# Patient Record
Sex: Male | Born: 1955 | Race: White | Hispanic: No | Marital: Married | Smoking: Never smoker
Health system: Southern US, Community
[De-identification: ages and names within clinical notes are randomized; demographics above are authoritative.]

## PROBLEM LIST (undated history)

## (undated) DIAGNOSIS — I428 Other cardiomyopathies: Secondary | ICD-10-CM

## (undated) DIAGNOSIS — G8929 Other chronic pain: Secondary | ICD-10-CM

## (undated) DIAGNOSIS — M549 Dorsalgia, unspecified: Secondary | ICD-10-CM

## (undated) DIAGNOSIS — I639 Cerebral infarction, unspecified: Secondary | ICD-10-CM

## (undated) DIAGNOSIS — I509 Heart failure, unspecified: Secondary | ICD-10-CM

## (undated) HISTORY — PX: CHOLECYSTECTOMY: SHX55

## (undated) HISTORY — PX: THUMB FUSION: SUR636

## (undated) HISTORY — PX: BACK SURGERY: SHX140

## (undated) HISTORY — PX: APPENDECTOMY: SHX54

## (undated) HISTORY — PX: ELBOW SURGERY: SHX618

## (undated) HISTORY — PX: ANKLE SURGERY: SHX546

---

## 2000-06-30 HISTORY — PX: CARDIAC CATHETERIZATION: SHX172

## 2001-01-15 ENCOUNTER — Inpatient Hospital Stay (HOSPITAL_COMMUNITY): Admission: AD | Admit: 2001-01-15 | Discharge: 2001-01-18 | Payer: Self-pay | Admitting: Cardiology

## 2002-03-14 ENCOUNTER — Inpatient Hospital Stay (HOSPITAL_COMMUNITY): Admission: EM | Admit: 2002-03-14 | Discharge: 2002-03-18 | Payer: Self-pay | Admitting: Emergency Medicine

## 2002-03-14 ENCOUNTER — Encounter: Payer: Self-pay | Admitting: Cardiovascular Disease

## 2002-03-14 ENCOUNTER — Encounter: Payer: Self-pay | Admitting: Emergency Medicine

## 2002-03-15 ENCOUNTER — Encounter: Payer: Self-pay | Admitting: Cardiology

## 2002-03-17 ENCOUNTER — Encounter: Payer: Self-pay | Admitting: Cardiology

## 2002-09-19 ENCOUNTER — Ambulatory Visit (HOSPITAL_COMMUNITY): Admission: RE | Admit: 2002-09-19 | Discharge: 2002-09-19 | Payer: Self-pay | Admitting: General Surgery

## 2002-11-15 ENCOUNTER — Encounter: Payer: Self-pay | Admitting: Neurosurgery

## 2002-11-15 ENCOUNTER — Encounter: Admission: RE | Admit: 2002-11-15 | Discharge: 2002-11-15 | Payer: Self-pay | Admitting: Neurosurgery

## 2002-11-17 ENCOUNTER — Emergency Department (HOSPITAL_COMMUNITY): Admission: EM | Admit: 2002-11-17 | Discharge: 2002-11-17 | Payer: Self-pay | Admitting: Emergency Medicine

## 2002-11-17 ENCOUNTER — Encounter: Payer: Self-pay | Admitting: Emergency Medicine

## 2003-02-02 ENCOUNTER — Encounter: Payer: Self-pay | Admitting: Neurosurgery

## 2003-02-06 ENCOUNTER — Encounter: Payer: Self-pay | Admitting: Neurosurgery

## 2003-02-06 ENCOUNTER — Ambulatory Visit (HOSPITAL_COMMUNITY): Admission: RE | Admit: 2003-02-06 | Discharge: 2003-02-07 | Payer: Self-pay | Admitting: Neurosurgery

## 2003-05-05 ENCOUNTER — Ambulatory Visit (HOSPITAL_COMMUNITY): Admission: RE | Admit: 2003-05-05 | Discharge: 2003-05-05 | Payer: Self-pay | Admitting: Neurosurgery

## 2005-10-29 ENCOUNTER — Encounter: Payer: Self-pay | Admitting: Neurosurgery

## 2006-12-30 ENCOUNTER — Ambulatory Visit: Payer: Self-pay | Admitting: Internal Medicine

## 2006-12-30 ENCOUNTER — Inpatient Hospital Stay (HOSPITAL_COMMUNITY): Admission: EM | Admit: 2006-12-30 | Discharge: 2007-01-02 | Payer: Self-pay | Admitting: Emergency Medicine

## 2007-01-01 ENCOUNTER — Ambulatory Visit: Payer: Self-pay | Admitting: Vascular Surgery

## 2010-05-21 ENCOUNTER — Ambulatory Visit: Payer: Self-pay | Admitting: Cardiology

## 2010-05-22 ENCOUNTER — Ambulatory Visit: Payer: Self-pay | Admitting: Cardiology

## 2010-05-22 ENCOUNTER — Observation Stay (HOSPITAL_COMMUNITY): Admission: AD | Admit: 2010-05-22 | Discharge: 2010-05-22 | Payer: Self-pay | Admitting: Cardiology

## 2010-09-10 LAB — BASIC METABOLIC PANEL
BUN: 8 mg/dL (ref 6–23)
CO2: 29 mEq/L (ref 19–32)
Calcium: 8.5 mg/dL (ref 8.4–10.5)
Chloride: 103 mEq/L (ref 96–112)
Creatinine, Ser: 0.85 mg/dL (ref 0.4–1.5)
GFR calc Af Amer: >60 mL/min (ref >60)
GFR calc non Af Amer: >60 mL/min (ref >60)
Glucose, Bld: 118 mg/dL — ABNORMAL HIGH (ref 70–99)
Potassium: 4 mEq/L (ref 3.5–5.1)
Sodium: 138 mEq/L (ref 135–145)

## 2010-09-10 LAB — CBC
HCT: 37.3 % — ABNORMAL LOW (ref 39.0–52.0)
Hemoglobin: 12.7 g/dL — ABNORMAL LOW (ref 13.0–17.0)
MCH: 29.4 pg (ref 26.0–34.0)
MCHC: 34 g/dL (ref 30.0–36.0)
MCV: 86.3 fL (ref 78.0–100.0)
Platelets: 234 10*3/uL (ref 150–400)
RBC: 4.32 MIL/uL (ref 4.22–5.81)
RDW: 13.5 % (ref 11.5–15.5)
WBC: 8.5 10*3/uL (ref 4.0–10.5)

## 2010-09-10 LAB — PROTIME-INR
INR: 0.98 (ref 0.00–1.49)
Prothrombin Time: 13.2 seconds (ref 11.6–15.2)

## 2010-09-10 LAB — GLUCOSE, CAPILLARY: Glucose-Capillary: 129 mg/dL — ABNORMAL HIGH (ref 70–99)

## 2010-09-10 LAB — D-DIMER, QUANTITATIVE: D-Dimer, Quant: <0.22 ug/mL-FEU (ref 0.00–0.48)

## 2010-11-12 NOTE — H&P (Signed)
NAME:  Dalton Porter NO.:  [id number]   MEDICAL RECORD NO.:  [id number]          PATIENT TYPE:  INP   LOCATION:  3732                         FACILITY:  [facility]   PHYSICIAN:  Duke Salvia, MD, FACCDATE OF BIRTH:  [birth year]   DATE OF ADMISSION:  12/30/2006  DATE OF DISCHARGE:                              HISTORY & PHYSICAL   BRIEF HISTORY:  [name] is a 55 year old white male who was  transferred down [facility] to [facility] for further evaluation of  chest discomfort.  Mr. Gelpi states that approximately 2 p.m. today,  after he had finished washing a truck, he suddenly developed sharp pains  in his left chest going through to his back associated with nausea.  He  stated that his left arm felt funny, slightly numb.  He denied any  associated shortness of breath or diaphoresis.  He rested and his friend  had dropped him off with his wife at a laundromat and his wife drove him  to the emergency room for further evaluation.  At [facility] emergency  room, despite receiving multiple medications, his discomfort was reduced  from a 7 to a 2, but here at [facility], even at 8 p.m. at night, he  still having discomfort.  He feels the discomfort today was worse  intensity and longer duration than usual.  He describes this discomfort  that he has had at least for 5 years.  In the past couple months it has  been occurring one to two times per week and he feels this is more  frequent.  He says his usual duration is less than 10 minutes.  It can  occur at rest and with activity.  It does not occur nocturnally.  He  denies any accidents or injuries.  It also has a pleuritic component  which is chronic.   PAST MEDICAL HISTORY:  Allergies include:  1. CODEINE.  2. ACE intolerance secondary to a cough.   Medications prior to admission:  1. Neurontin 400 mg q.i.d.  2. Adalat 300 mg b.i.d.  3. Glipizide 5 mg daily.  4. Morphine ER 300 mg b.i.d.  5. Morphine IR 15 mg q.6h.  p.r.n.  6. Trazodone 100 mg two tablets q.h.s.  7. Afrin p.r.n.  8. Baby aspirin 81 daily.   Medical history is notable for:  1. Hypertension.  2. Type 2 diabetes with sugars ranging the 60s and 160s.  3. Depression.  4. Obesity.  5. He has a history of possible dilated cardiomyopathy.      Catheterization on January 18, 2001, showed an EF of 40%, no coronary      artery disease.  Echocardiogram on March 15, 2002, showed a low-      normal EF but was technically limited.  He was released from Dr.      Margarita Mail care in 2003 and recommended to follow up with his primary      care doctor for a sleep study to evaluate for obstructive sleep      apnea.  The patient states that his last stress test less than  1      year and he stated this was okay.  He is not sure if it was a      Myoview or a stress test only.  He thinks he may have had a      echocardiogram at that time as well as.   Surgical history is notable for:  1. T&A.  2. Appendectomy.  3. Hemorrhoid.  4. Nasal surgery.   SOCIAL HISTORY:  He resides in [place] with his wife.  He has one son, one  daughter, one granddaughter.  He is on disability secondary to his back.  He denies any history of tobacco, alcohol, drugs or herbal medication.  He states that he tries to maintain low salt/fat/cholesterol ADA diet.  He does not exercise regularly.   FAMILY HISTORY:  His mother is alive and well at the age of 67 with a  history of hyperlipidemia.  His father, age 51, alive and well.  He has  two sisters, one brother.  His brother is deceased at age 3 during a  house fire.   REVIEW OF SYSTEMS:  In addition to the above notable for chronic sinus  problems; contacts; dyspnea on exertion for the preceding 3 months;  positive snoring; nocturia; back, bilateral knees and hands arthralgias,  dysphagia with food and liquids.  He has not been referred to a GI  physician from his primary care physician.  He also describes some  abdominal  discomfort and constipation.   PHYSICAL EXAMINATION:  GENERAL:  Well-nourished, well-developed,  pleasant, obese white male in no apparent distress.  His wife is  present.  VITAL SIGNS:  Temperature is 98.4, blood pressure 115/62, pulse 81 and  regular, respirations 18 and regular, 96% saturation on 2 L.  HEENT:  Unremarkable.  NECK:  Supple without thyromegaly, adenopathy, JVD or carotid bruits.  CHEST:  Symmetrical excursion.  Clear to auscultation.  HEART:  PMI is not displaced.  Regular rate and rhythm with a normal S1  and S2.  Do not appreciate any murmurs, rubs, clicks or gallops.  All  pulses symmetrical and intact.  Do not appreciate any abdominal or  femoral bruits.  SKIN:  Integument is intact.  ABDOMEN:  Obese.  Bowel sounds present without organomegaly, masses or  tenderness.  EXTREMITIES:  Negative cyanosis, clubbing or edema.  MUSCULOSKELETAL:  Grossly unremarkable.  He does have some tenderness  with pushing with his upper extremities but no reproducible chest  discomfort on palpation or full range motion of upper extremities.  NEUROLOGIC:  Unremarkable.   Chest x-ray report from [facility] is pending.  EKG from [facility] and  repeated at [facility] shows normal sinus rhythm, normal axis, normal  intervals, nonspecific changes, early R waves, no changes compared to  old EKGs.  H&H is 13.3 and 37.0, platelets 309, wbc's 8.6.  Sodium 142,  potassium 4.0, BUN 11, creatinine 0.8, glucose 119.  CK-MB and troponin  from [facility] are within normal limits.  PTT 23, PT 12.0.   IMPRESSION:  1. Prolonged atypical chest discomfort with negative catheterization      in 2002.  EKG, enzymes at [facility] and EKG at [facility] have all been      within normal limits.  2. Probable obstructive sleep apnea, given obesity.  3. Dysphagia with food and water.  4. History as listed per past medical history.   DISPOSITION:  Dr. Graciela Husbands reviewed the patient's history, spoke with and  examined the patient.   We will admit him  to rule out myocardial  infarction.  However, we doubt his discomfort is cardiac ischemia  related.  Given his continued nature of chest discomfort, despite its  atypical nature, cardiac catheterization will be performed on December 31, 2006.  Cardiac catheterization as planned.  Anticipate discharge with  outpatient followup with his primary care physician, suggesting barium  swallow with a GI evaluation and obstructive sleep apnea evaluation.  We  will begin Protonix, also place him on ARB for kidney protection, and  continue his home medications.      Joellyn Rued, PA-C      Duke Salvia, MD, [facility]  Electronically Signed    EW/MEDQ  D:  12/30/2006  T:  12/31/2006  Job:  [id number]   cc:   Learta Codding, MD,FACC

## 2010-11-12 NOTE — Discharge Summary (Signed)
NAME:  Dalton Porter, Dalton Porter                ACCOUNT NO.:  [id number]   MEDICAL RECORD NO.:  [id number]          PATIENT TYPE:  INP   LOCATION:  3732                         FACILITY:  [facility]   PHYSICIAN:  Duke Salvia, MD, FACCDATE OF BIRTH:  1955/08/24   DATE OF ADMISSION:  12/30/2006  DATE OF DISCHARGE:  01/02/2007                               DISCHARGE SUMMARY   DISCHARGE DIAGNOSIS:  1. Chest pain ruled out for myocardial infarction,status post cardiac      catheterization this admission on December 31, 2006 showing normal      coronaries with mild right coronary artery spasm, pain reproducible      but not the same as the discomfort the patient states brought him      in.  Ejection fraction of 55% with mild global hypokinesis.  2. Diabetes.  Hemoglobin A1c 6.6 this admission.  3. Depression.  4. Chronic back pain requiring narcotic use.  5. Probable obstructive sleep apnea.  6. Anemia.  H&H 11.6 and 33.7 at time of discharge  7. Obesity.   PAST MEDICAL HISTORY AND INCLUDES SURGICAL HISTORY:  Appendectomy, nasal  surgery, chronic back pain requiring disability and ongoing narcotic  use.   PROCEDURES THIS ADMISSION:  Include cardiac catheterization on  12/31/2006 and right groin ultrasound 01/01/2007.  Ruled out pseudo  aneurysm or AV fistula, negative per ultrasound.   HOSPITAL COURSE:  A 55 year old Caucasian gentleman with no known  coronary artery disease history who presented to [facility]  initially for complaints of discomfort in his chest after he finished  washing his truck.  Patient with past medical history as stated above,  also with questionable history of dilated cardiomyopathy.  The patient  states he had a cardiac catheterization in 2002.  EF at that time of  40%, no coronary artery disease.  Apparently he had an echocardiogram  done in 2003 that showed a normal EF.  The patient with questionable  sleep apnea has been advised to have this evaluated in the past but  has  never followed up.  Initial EKG at [facility] showed normal sinus rhythm,  nonspecific changes.  No changes compared to old EKG.  H&H 13.3 and 37.  Cardiac markers at [facility] within normal limits.  The patient  transferred to [facility] for further evaluation.  The patient agreeable  to proceed with cardiac catheterization for further diagnostic  evaluation.  The patient to the cath lab on the date stated above,  results as stated above.  The patient tolerated procedure without  complications initially, later in the day complained of ongoing  discomfort in his right groin status post cath site.  The patient up  ambulating without any bleeding from cath site.  CBC stable postcath,  11.6 and 33.7 no change from previous H&H.  However, the patient had  ongoing pain in right groin.  No bruit.  No palpable hematoma.  Right  groin ultrasound obtained.  The patient kept overnight.  Right groin  ultrasound no evidence of pseudoaneurysm or AV fistula.  Dr. Graciela Husbands in to  see the patient on day of  discharge, afebrile, blood pressure stable,  patient stable to be discharged home.   FOLLOW UP:  The patient follow up with primary care physician.  That  would be Dr. Oswald Hillock in [place], [place], and the patient to follow up  with Dr. Andee Lineman at the [facility] office.  At time of discharge he has been  given the post cardiac catheterization discharge instructions regarding  activity limitations and wound care.  He is to follow up with Dr. Clelia Croft  for primary care needs.  No further of cardiology follow-up will  required at this time per Dr. Graciela Husbands.   MEDICATIONS AS PREVIOUSLY PRESCRIBED:  1. Gabapentin  2. Etodolac  3. Glipizide  4. Trazodone  5. Morphine  6. New prescription for Protonix 40 mg daily.   The patient is encouraged to follow up with his primary care physician  for outpatient sleep study and a GI workup.  He is to call our office in  [place] at [phone] for any problems from cath site.  There  is discrepancy.  It states the patient's primary care physician is Dr. Oswald Hillock in [place],  [place], previously Dr. Sherryll Burger in [place].   Duration of discharge encounter is greater than 30 minutes between  myself and physician.      Dorian Pod, ACNP      Duke Salvia, MD, [facility]  Electronically Signed    MB/MEDQ  D:  01/02/2007  T:  01/03/2007  Job:  [id number]   cc:   Dr. Oswald Hillock, [facility]  Kirstie Peri, MD

## 2010-11-12 NOTE — Cardiovascular Report (Signed)
NAME:  Dalton Porter, Dalton Porter                ACCOUNT NO.:  [id number]   MEDICAL RECORD NO.:  [id number]          PATIENT TYPE:  INP   LOCATION:  3732                         FACILITY[facility]  [facility]   PHYSICIAN:  Rollene Rotunda, MD, FACCDATE OF BIRTH:  February 28, 1956   DATE OF PROCEDURE:  12/31/2006  DATE OF DISCHARGE:                            CARDIAC CATHETERIZATION   PRIMARY:  Dr. Oswald Hillock at the [facility].   CARDIOLOGIST:  Dr. Lewayne Bunting.   PROCEDURE:  Left heart catheterization, coronary arteriography.   INDICATION:  Evaluate patient with chest pain suggestive of unstable  angina.   PROCEDURE:  Left heart catheterization was performed via the right  femoral artery.  The artery was cannulated using an anterior wall  puncture.  A #6-French arterial sheath was inserted via the modified  Seldinger technique.  Preformed Judkins and a pigtail catheter were  utilized.  The patient did have some discomfort with injection of his  right coronary artery.  There was some coronary artery spasm.  This is  much improved and almost gone by the time he left the cath lab.  There  were no changes on the monitored leads.   RESULTS:   HEMODYNAMICS:  LV 124/22, AO 128/94.   CORONARIES:  Left main was normal.  The LAD was normal.  First diagonal  was moderate-sized and normal.  The second diagonal was small and  normal.  Circumflex in the AV groove was normal.  There is a large first  obtuse marginal which had distal branches; it was normal throughout its  course.  Posterolateral was small and normal.  The right coronary artery  was a dominant, but somewhat small vessel.  There was some catheter-  induced spasm; he did have some discomfort with this; this responded to  sublingual nitroglycerin.  The vessel had no fixed obstructive disease.   LEFT VENTRICULOGRAM:  The left ventriculogram was obtained in the RAO  projection.  The EF was 55% with mild global hypokinesis.   CONCLUSION:  Normal coronary  arteries.  Mild right coronary artery  spasm; this did produce pain, but this was not the discomfort he  described on admission.   PLAN:  The patient should continued to have primary risk reduction.  He  will have an outpatient evaluation to consider other non-anginal  etiologies of his chest pain.      Rollene Rotunda, MD, [facility]  Electronically Signed     JH/MEDQ  D:  12/31/2006  T:  01/01/2007  Job:  [id number]

## 2010-11-15 NOTE — H&P (Signed)
NAME:  Dalton Porter, Dalton Porter                          ACCOUNT NO.:  [id number]   MEDICAL RECORD NO.:  [id number]                   PATIENT TYPE:  EMS   LOCATION:  MAJO                                 FACILITY:  [facility]   PHYSICIAN:  Noralyn Pick. Eden Emms, M.D. [facility]           DATE OF BIRTH:  [birth year]   DATE OF ADMISSION:  03/14/2002  DATE OF DISCHARGE:                                HISTORY & PHYSICAL   HISTORY OF PRESENT ILLNESS:  The patient is a 55 year old patient of Dr.  Andee Lineman and Dr. Clelia Croft in [place].  He has a history of nonischemic  cardiomyopathy.  His last catheterization was in September of last year and  showed an EF in the 40% range with no coronary disease. He awoke this  morning with some atypical left substernal chest pain.  It increased with  activity and had some pleuritic component. He had some nausea and vomiting  with this but no shortness of breath.  The pain seemed to have been relieved  with nitroglycerin and rest.   He had similar symptoms about six weeks ago.   At that time he just rested and his symptoms resolved.  He says he has had a  recent stress test in [place] in July with Dr. Clelia Croft and it was fine.   His catheterization January 18, 2001, showed normal coronaries and an EF of 40%  and the coronary risk factors include diabetes.   He is currently being treated for chronic bronchitis and sinusitis. He is on  multiple medications including Flonase, Singulair, Allegra. He just had some  recent sinus surgery and is currently not on antibiotics.  He has a deviated  septum and it seems that he has been fairly frustrated with the care of this  problem.   SOCIAL HISTORY:  He lives in [place] with his wife. He has a remote alcohol  history. 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   MEDICATIONS:  1. Allegra.  2. Flonase.  3. Singulair 10 a day.  4. Altace 2.5 a day.  5. Toprol-XL 50 a day.  6. Glucophage 1000 mg a day.  7. Amitriptyline q.h.s.  8. Darvocet  p.r.n.   PHYSICAL EXAMINATION:  HEENT:  His examination is remarkable for some nasal  congestion.  VITAL SIGNS:  Blood pressure is 130/60, pulse is 70 and regular.  LUNGS:  Mild end-expiratory wheezes at the bases.  Carotids normal.  HEART:  There is an S1, S2 without murmur, rub, or click.  ABDOMEN:  Benign.  EXTREMITY:  Lower extremities have intact pulse, no edema.   ECG is normal. Chest x-ray shows cardiomegaly with no active disease.   Labs are pending at this time.   IMPRESSION:  The patient's pain would appear to be noncardiac in etiology.  However, he is a diabetic the pain was relieved with nitroglycerin.   PLAN:  We will admit him to rule  out myocardial infarction.  We will do a  computed tomographic scan in the ER to further assess for other pathology  and pneumothorax. I suspect that his pain is more musculoskeletal in nature  and/or related to pleuritic cardial disease.   We will also check a sedimentation rate to rule out any active inflammation.   He has mild end-expiratory wheezes on examination and the CT scan will  hopefully rule out any significant PE.  He does not have any lower extremity  edema or evidence of new catheterization pain.   We will check his blood sugar to make sure that it is in order on continuous  Glucophage.   Further work-up of his sinusitis and deviated septum will be per his primary  care M.D.   At least for the time being, given his recent Cardiolite study and normal  catheterization a year ago, I do not think functional cardiac testing is  indicated unless his pain becomes more typical or his enzymes are positive.                                               Noralyn Pick. Eden Emms, M.D. [facility]    PCN/MEDQ  D:  03/14/2002  T:  03/14/2002  Job:  [id number]

## 2010-11-15 NOTE — Op Note (Signed)
NAME:  Dalton Porter, Dalton Porter                          ACCOUNT NO.:  [id number]   MEDICAL RECORD NO.:  [id number]                   PATIENT TYPE:  OIB   LOCATION:  3172                                 FACILITY:  [facility]   PHYSICIAN:  Reinaldo Meeker, M.D.              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   PROCEDURE:  T7-8, T8-9 decompressive laminotomy, followed by diskectomy at  T7-8.   SURGEON:  Reinaldo Meeker, M.D.   ASSISTANT:  Dr. Felix Pacini.   PROCEDURE IN DETAIL:  After being placed in the prone position, the  patient's back was prepped and draped in the usual sterile fashion.  A  series of localizing x-rays were taken to identify the appropriate level.  A  midline incision was made above the spinous processes of T7, T8, and T9.  Using Bovie cutting current, the incision was carried down to the spinous  processes.  A subperiosteal dissection was then carried out along the  spinous processes, lamina, and facet joint.  A self-retaining retractor was  placed for exposure.  X-rays showed we approached the appropriate level.  Starting at T7-8, a high-speed drill was used to perform a laminotomy above  the level of the inferior one-half of the T7 lamina and the superior one-  quarter of the T8 lamina.  The residual bone ligament were removed in a  piecemeal fashion.  Transpedicular decompression was then carried out, going  wide laterally at the level of the disk space and then drilling the superior  aspect of the pedicle to give good access to the lateral aspect of the disk  space.  A similar decompression was then carried out at T8-9 and once again  over the inferior one-half of the T8 lamina, the superior one-quarter of the  T9 lamina, and drilled in far lateral  to allow access to the disk space.  Nerve root decompressions were carried out at both levels.  In the process  of doing this, a small tear in the axilla of the T8 nerve root was  encountered.  This was patched with Tisseel at the end of the case.  At this  point, the microscope was draped and brought into the field.  Starting at T7-  8, microdissection technique was used to identify the lateral aspect of the  spine and nerve root.  The disk space was then incised with micropituitary  and curettes.  Disk space clean-out was carried out to decompress the nerve  root.  Inspection of T8-9 showed that the disk area was calcified with no  definite evidence of removable disk, and it was elected not to incise the  disk space at that level.  At  this point, large amounts of irrigation were  carried out.  Any bleeding was controlled with bipolar coagulation and  Gelfoam.  The wound was then closed using interrupted Vicryl in the muscle  and fascia and subcutaneous and subcuticular tissues and running nylon on  the skin.  A sterile dressing was then applied, and the patient was  extubated and was taken to the recovery room in stable condition.                                               Reinaldo Meeker, M.D.    ROK/MEDQ  D:  02/06/2003  T:  02/06/2003  Job:  [id number]

## 2010-11-15 NOTE — Discharge Summary (Signed)
NAME:  Dalton Porter, Dalton Porter                          ACCOUNT NO.:  [id number]   MEDICAL RECORD NO.:  [id number]                   PATIENT TYPE:  INP   LOCATION:  3016                                 FACILITY:  [facility]   PHYSICIAN:  Noralyn Pick. Eden Emms, M.D. [facility]           DATE OF BIRTH:  [birth year]   DATE OF ADMISSION:  03/14/2002  DATE OF DISCHARGE:                           DISCHARGE SUMMARY - REFERRING   PROCEDURE:  Esophagogastroduodenoscopy on 09/17, pulmonary function tests  09/18, abdominal ultrasound 09/18, chest CT scan 09/15.   REASON FOR ADMISSION:  Please refer to dictation admission note.   LABORATORY DATA:  Normal CBC, INR 1.0, D-dimer less than 0.22.  Electrolytes  and renal function within normal limits.  Normal liver enzymes.  Normal  amylase/lipase.  Normal serial cardiac markers (x three).   ADMISSION CXR:  Cardiomegaly:  No active disease.   HOSPITAL COURSE:  Mr. Lupinski is a 55 year old male, with history of  nonischemic cardiomyopathy followed by Dr. Andee Lineman in [place], who presented for  evaluation of chest pain.   The patient underwent subsequent extensive diagnostic testing.  He ruled out  for MI with normal serial cardiac enzymes.  Workup for pulmonary embolus  consisted of a D-dimer, which was negative, as well as CT scan which was  entirely within normal limits.   The patient was referred to Dr. Jayme Cloud for evaluation of cough.  She felt  this was due to bronchospasm which could represent reactive airway disease  versus asthma.  Recommendation was to consider discontinuation of ACE  inhibitor, and this was done at the time of discharge.  The patient was also  placed on a trial of Advair and was referred for pulmonary function testing  - these were performed morning of scheduled discharge and were within normal  limits as well.   The patient also underwent GI evaluation for evaluation of her nausea and  epigastric discomfort.  Esophagogastroduodenoscopy was entirely  within  normal limits.  Recommendation was to initiate treatment with PPI for eight  weeks.  The patient was also referred for abdominal ultrasound.  This also  was within normal limits.   Following completion of these tests, no further workup was recommended, and  the patient was cleared for discharge on hospital day #3.   MEDICATION ADJUSTMENTS THIS ADMISSION:  Addition of Protonix and Advair,  discontinuation of Altace.   DISCHARGE MEDICATIONS:  Protonix 40 mg q.d., Advair 100/50 one inhalation  twice daily, Allegra 60 mg b.i.d., Flonase as previously directed, Singulair  10 mg q.d., Toprol XL 50 mg q.d., Glucophage XR 1000 mg q.d., amitriptyline  20 mg q.h.s., Xanax 0.5 mg p.r.n.   DISCHARGE INSTRUCTIONS:  1. Stop taking Altace.  2. Followup with Dr. Andee Lineman on Friday, October 17, at 10 a.m. at the [facility] in [place].  3. Arrange followup with Dr. Kirstie Peri in approximately two weeks.  DISCHARGE DIAGNOSES:  1. Nonischemic chest pain.     a. Normal serial cardiac enzymes.  2. Nonischemic cardiomyopathy.     a. Normal coronary arteries; if 40% with global hypokinesis -        catheterization in July 2002.  3. Bronchospasm.     a. Normal pulmonary function tests.     b. ACE inhibitor discontinued.  4. Type 2 diabetes mellitus.     Gene Serpe, P.A. [facility]                      Peter C. Eden Emms, M.D. [facility]    GS/MEDQ  D:  03/18/2002  T:  03/21/2002  Job:  [id number]   cc:   Kirstie Peri  [address]  [place]  [place] [address]  Fax: [phone]   [facility]  [address]  [place], [place] [address]   Wilhemina Bonito. Eda Keys., M.D. [facility]   Danice Goltz, M.D. [facility]  [address] [place], [place] [address]  Fax: [phone]

## 2010-11-15 NOTE — Consult Note (Signed)
NAME:  Dalton Porter, Dalton Porter                          ACCOUNT NO.:  [id number]   MEDICAL RECORD NO.:  [id number]                   PATIENT TYPE:  INP   LOCATION:  3016                                 FACILITY:  [facility]   PHYSICIAN:  Danice Goltz, M.D. [facility]            DATE OF BIRTH:  August 14, 1955   DATE OF CONSULTATION:  03/17/2002  DATE OF DISCHARGE:                                   CONSULTATION   REASON FOR CONSULTATION:  Cough, rule out pneumonia.   BRIEF HISTORY:  This is a 55 year old white male with a longstanding history  of diabetes mellitus and nonischemic cardiomyopathy who presented on  03/14/2002 with substernal chest pain.  He was admitted for cardiology  service,and he has been ruled out for MI.  Cardiac workup so far has been  noncontributory.  He has persisted in having chest discomfort as well as  nausea and dyspeptic symptoms.  He has been evaluated by our GI department,  and they found no cause for his symptoms via EGD.  Today an ultrasound of  the gallbladder was completed which was entirely normal.   The patient states he has had a cough since April.  He has had chronic upper  respiratory symptoms and chronic sinusitis, being treated for 30 days with  rotating antibiotics to include Cipro, and antibiotic unknown to him, Ancef.  He also had two trials of prednisone which did not help his symptoms at all.  He has had followup CTs.  He apparently was being followed by an ENT  physician in [place].  His primary care physician is Dr .Sherryll Burger.   The patient denies any chronic dyspnea.  He states that occasionally he  wheezes, but this is not a major problem.  He denies any productive cough.  He has had no hemoptysis.  He voices no other complaints.  He denies any  fevers, chills, or sweats.   CURRENT MEDICATIONS:  As noted on the MAR.  These were studied.  Most of his  medications are for chronic nasal congestion and management of his  cardiomyopathy.   SOCIAL HISTORY:  He works  as a Freight forwarder.  He lives in [place].  He is  married and lives with his wife.  He is not exposed to industrial dust.  He  denies any cigarette use ever.  He is not exposed to second-hand smoke.   FAMILY HISTORY:  Noncontributory to the current complaint.   REVIEW OF SYSTEMS:  Remarkable for as noted above.  He denies any other  symptomatology.   ALLERGIES:  CODEINE causes nausea and vomiting.   PHYSICAL EXAMINATION:  VITAL SIGNS:  Blood pressure 99/56, pulse 70.  The  patient is afebrile at 98.4.  He has been afebrile during his entire  hospitalization.  Oxygen saturation is 93% on room air.  GENERAL:  Moderately obese white male who is in no acute distress.  He does  have  a nasal voice.  HEENT:  Remarkable for nasal congestion.  NECK:  Supple.  No adenopathy noted, no JVD.  LUNGS:  The patient has no rhonchi noted.  He has some end-expiratory  bibasilar wheezes.  These do not clear with cough or deep inspiration.  CARDIAC:  Distant, regular rate and rhythm.  No rubs, murmurs, or gallops  heard.  ABDOMEN:  Obese.  He had some diffuse epigastric tenderness which is  nonspecific.  No hepatosplenomegaly noted.  EXTREMITIES:  No cyanosis, clubbing, or edema noted.  NEUROLOGIC:  Grossly nonfocal.   LABORATORY DATA:  I have reviewed all the patient's laboratory data.  The  most salient data is the fact that he has a clear chest x-ray.   CT scan of the chest failed to show any infiltrates and no PE.   In addition, the patient has no gallstones noted on ultrasound of the  gallbladder done today.   Cardiac workup so far has been negative.   IMPRESSION:  1. Chronic cough which is likely multifactorial.  I believe the patient has     cough secondary to postnasal drip syndrome due to chronic sinusitis.  In     addition, this could be compounded by chronic gastroesophageal reflux     with laryngopharyngeal reflux.  Additional causes could be ACE inhibitor     side effect.  Further  asthma or bronchospasm could also trigger cough in     the form of cough flare asthma.  2. Bronchospasm.  Rule out reactive airway disease versus true asthma.     Reactive airway could be occurring due to the patient's sinus disease and     should resolve once the sinus disease is cleared.  3. Chronic sinusitis.  Being managed by ENT already.  4. History of nonischemic cardiomyopathy managed per cardiology.   PLAN:  1. Obtain pulmonary function testing.  2. Will give the patient a trial of Advair 100/50 one inhalation twice a     day.  3. Would consider discontinuing ACE inhibitor if cough continues.  4. Would try to simplify his extensive medication regimen which appears to     be excessive.  5. All of these items can be followed as an outpatient should the patient be     discharged.  He would like to continue followup with Dr. Sherryll Burger in [place] and     also with pulmonary in [place] whom he has seen before but cannot recall the     name.   Thank you very much for the consult.  I will continue to follow along with  you while the patient is in house.                                               Danice Goltz, M.D. [facility]    LG/MEDQ  D:  03/17/2002  T:  03/19/2002  Job:  [id number]

## 2010-11-15 NOTE — H&P (Signed)
NAME:  Dalton Porter, Dalton Porter NO.:  [id number]   MEDICAL RECORD NO.:  [id number]                  PATIENT TYPE:   LOCATION:                                       FACILITY:   PHYSICIAN:  Dalia Heading, M.D.               DATE OF BIRTH:  05-Sep-1955   DATE OF ADMISSION:  DATE OF DISCHARGE:                                HISTORY & PHYSICAL   CHIEF COMPLAINT:  Chronic cholecystitis.   HISTORY OF PRESENT ILLNESS:  The patient is a 55 year old white male who is  referred for evaluation and treatment of biliary colic secondary to chronic  cholecystitis.  He has been having intermittent episodes of right upper  quadrant abdominal pain with radiation to the right flank, nausea,  indigestion, and bloating for the past several weeks. It seems to be getting  worse.  No fever, chills, or jaundice have been noted.  CT scan of the  abdomen was negative for any other problem.   PAST MEDICAL HISTORY:  Past medical history includes hypertension, non-  insulin-dependent diabetes mellitus, depression.   PAST SURGICAL HISTORY:  Tonsillectomy, appendectomy, extremity surgery,  hemorrhoid surgery.   CURRENT MEDICATIONS:  1. Glucophage XR 1 gm p.o. daily.  2. Toprol XL 50 mg p.o. daily.  3. Xanax 0.5 mg p.o. b.i.d.  4. Altace 2.5 mg p.o. daily.  5. Amitriptyline 10 mg 2 tablets q.h.s.   ALLERGIES:  Codeine.   REVIEW OF SYSTEMS:  Noncontributory.   PHYSICAL EXAMINATION:  GENERAL:  On physical examination, the patient is a  well-developed well-nourished white male in no acute distress.  VITAL SIGNS:  He is afebrile and vital signs are stable.  HEENT:  Examination reveals no scleral icterus.  LUNGS:  Clear to auscultation with equal breath sounds bilaterally.  HEART:  Heart examination reveals a regular rate and rhythm without S3, S4,  or murmurs.  ABDOMEN:  The abdomen is soft with slight tenderness noted in the right  upper quadrant to palpation.  No  hepatosplenomegaly, masses, or hernias are  identified.   X-RAY DATA:  The hepatobiliary scan reveals a normal gallbladder ejection  fraction after the second injection of CCK, but his symptoms were  reproducible with CCK injection.   IMPRESSION:  Biliary colic, chronic cholecystitis.   PLAN:  The patient is scheduled for laparoscopic cholecystectomy on  09/19/2002.  The risks and benefits of the procedure including bleeding  infection, hepatobiliary injury, and the possibility of an open procedure  were fully explained to the patient who gave informed consent.                                               Dalia Heading, M.D.    MAJ/MEDQ  D:  09/15/2002  T:  09/16/2002  Job:  [id number]   cc:   Kirstie Peri  [address]  [place] [address]  Fax: [phone]

## 2010-11-15 NOTE — Cardiovascular Report (Signed)
[place]  Patient:    Dalton Porter, Dalton Porter                       MRN: [id number] Proc. Date: 01/18/01 Adm. Date:  [id number] Attending:  Learta Codding CC:         Tawnya Crook, M.D., [facility]  Lewayne Bunting, M.D.  Cardiopulmonary Laboratory   Cardiac Catheterization  PROCEDURES PERFORMED:  Cardiac catheterization.  CLINICAL HISTORY:  The patient is 55 years old and has a remote history of a normal catheterization.  Over the last 10 days, he has developed sharp, left-sided chest pain, which is mostly nonexertional.  He was evaluated with a Cardiolite scan which showed an ejection fraction of 40% and questionable inferoapical scarring.  For this reason, he was scheduled for evaluation angiography.  DESCRIPTION OF PROCEDURE:  The procedure was performed via the right femoral artery using an arterial sheath and 6 French preformed coronary catheters.  A front wall arterial puncture was performed and Omnipaque contrast was used. The patient tolerated the procedure well and left the laboratory in satisfactory condition.  RESULTS:  The left main coronary artery:  The left main coronary artery was free of significant disease.  Left anterior descending:  The left anterior descending artery gave rise to three diagonal branches and three septal perforators.  These and the LAD were free of significant disease, although the flow down the LAD was slightly slow (TIMI-2).  Circumflex artery:  The circumflex artery gave rise to a large marginal branch and AV branch which terminated in a posterolateral branch.  These vessels were free of significant disease.  Right coronary artery:  The right coronary is a small vessel that gave rise to a conus branch, a small large right ventricular branch and a very small posterior descending branch.  Initially, there was some spasm in the proximal vessel but this resolved with nitroglycerin and there was no  significant disease.  LEFT VENTRICULOGRAPHY:  The left ventriculogram performed in the RAO projection showed global hypokinesis with an estimated ejection fraction of the 40%.  HEMODYNAMIC DATA:  The aortic pressure was 118/73 with a mean of 90.  Left ventricular pressure was 118/27.  CONCLUSIONS: 1. Normal coronary angiography. 2. Mild to moderate global hypokinesis consistent with a nonischemic    cardiomyopathy.  RECOMMENDATIONS:  The patient appears to have a early nonischemic cardiomyopathy.  The etiology is not clear.  He does not have much alcohol consumption and has no history of hypertension by his account.  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  We will plan treatment with ACE inhibitors and beta blockers and followup in the [facility]. DD:  01/18/01 TD:  01/18/01 Job: [id number]

## 2010-11-15 NOTE — Op Note (Signed)
NAME:  ISON, WICHMANN                          ACCOUNT NO.:  [id number]   MEDICAL RECORD NO.:  [id number]                   PATIENT TYPE:  AMB   LOCATION:  DAY                                  FACILITY:  [facility]   PHYSICIAN:  Dalia Heading, M.D.               DATE OF BIRTH:  13-Jan-1956   DATE OF PROCEDURE:  09/19/2002  DATE OF DISCHARGE:                                 OPERATIVE REPORT   PREOPERATIVE DIAGNOSIS:  Chronic cholecystitis.   POSTOPERATIVE DIAGNOSIS:  Chronic cholecystitis.   PROCEDURE:  Laparoscopic cholecystectomy   SURGEON:  Dalia Heading, M.D.   ASSISTANT:  Bernerd Limbo. Leona Carry, M.D.   ANESTHESIA:  General endotracheal   INDICATIONS:  The patient is a 55 year old white male who presents with  biliary colic secondary to chronic cholecystitis.  The risks and benefits of  the procedure including bleeding, infection, hepatobiliary injury, and the  possibility of an open procedure were fully explained to the patient, who  gave informed consent.   DESCRIPTION OF PROCEDURE:  The patient was placed in the supine position.  After induction of general endotracheal anesthesia, the abdomen was prepped  and draped using the usual sterile technique with Betadine.  Surgical site  confirmation was performed.   A supraumbilical incision was made down to the fascia.  A Veress needle was  introduced into the abdominal cavity and confirmation of placement was done  using the saline drop test.  The abdomen was then insufflated to 16 mmHg  pressure.  An 11-mm trocar was introduced into the abdominal cavity under  direct visualization without difficulty.  The patient was placed in reverse  Trendelenburg position and an additional 11-mm trocar was placed in the  epigastric region and 5-mm trocars were placed in the right upper quadrant  and right flank regions. The liver was inspected and noted to be within  normal limits.  The gallbladder was retracted superiorly and laterally.   The  dissection was begun around the infundibulum of the gallbladder.  The cystic  duct was first identified.  Its junction to the infundibulum fully  identified.  Endoclips were placed proximally and distally on the cystic  duct; and the cystic duct was divided.  This was likewise done on the cystic  artery.  The gallbladder was then freed away from the gallbladder fossa  using Bovie electrocautery.  The gallbladder was delivered through the  epigastric trocar site without difficulty.  The gallbladder fossa was  inspected and no abnormal bleeding or bile leakage was noted.  All fluid and  air were then evacuated from the abdominal cavity prior to removal of the  trocars.   All wounds were irrigated with normal saline.  All wounds were injected with  0.5% Sensorcaine.  The supraumbilical fascia was reapproximated using an #0  Vicryl interrupted suture. All skin incisions were closed using staples.  Betadine ointment and dry sterile dressings  were applied.   All tape and needle counts correct at the end of the procedure.  The patient  was extubated in the operating room and went back to recovery room in awake  and stable condition.   COMPLICATIONS:  None.   SPECIMENS:  Gallbladder.   BLOOD LOSS:  Minimal.                                               Dalia Heading, M.D.    MAJ/MEDQ  D:  09/19/2002  T:  09/19/2002  Job:  [id number]   cc:   Kirstie Peri  [address]  [place] [address]  Fax: [phone]

## 2011-01-15 ENCOUNTER — Telehealth: Payer: Self-pay | Admitting: Cardiology

## 2011-01-15 ENCOUNTER — Other Ambulatory Visit: Payer: Self-pay

## 2011-01-15 ENCOUNTER — Encounter: Payer: Self-pay | Admitting: Emergency Medicine

## 2011-01-15 ENCOUNTER — Observation Stay (HOSPITAL_COMMUNITY)
Admission: EM | Admit: 2011-01-15 | Discharge: 2011-01-16 | Disposition: A | Discharge status: Home / Self Care | Payer: Medicare Other | Attending: Internal Medicine | Admitting: Internal Medicine

## 2011-01-15 ENCOUNTER — Emergency Department (HOSPITAL_COMMUNITY): Payer: Medicare Other

## 2011-01-15 DIAGNOSIS — I10 Essential (primary) hypertension: Secondary | ICD-10-CM | POA: Diagnosis present

## 2011-01-15 DIAGNOSIS — R079 Chest pain, unspecified: Secondary | ICD-10-CM | POA: Diagnosis present

## 2011-01-15 DIAGNOSIS — R0602 Shortness of breath: Secondary | ICD-10-CM | POA: Insufficient documentation

## 2011-01-15 DIAGNOSIS — E1142 Type 2 diabetes mellitus with diabetic polyneuropathy: Secondary | ICD-10-CM | POA: Insufficient documentation

## 2011-01-15 DIAGNOSIS — Z79899 Other long term (current) drug therapy: Secondary | ICD-10-CM | POA: Insufficient documentation

## 2011-01-15 DIAGNOSIS — R0789 Other chest pain: Principal | ICD-10-CM | POA: Insufficient documentation

## 2011-01-15 DIAGNOSIS — E1149 Type 2 diabetes mellitus with other diabetic neurological complication: Secondary | ICD-10-CM | POA: Diagnosis present

## 2011-01-15 HISTORY — DX: Other chronic pain: G89.29

## 2011-01-15 HISTORY — DX: Other chronic pain: M54.9

## 2011-01-15 HISTORY — DX: Heart failure, unspecified: I50.9

## 2011-01-15 LAB — COMPREHENSIVE METABOLIC PANEL
ALT: 48 U/L (ref 0–53)
AST: 37 U/L (ref 0–37)
Albumin: 3.8 g/dL (ref 3.5–5.2)
Alkaline Phosphatase: 51 U/L (ref 39–117)
BUN: 12 mg/dL (ref 6–23)
CO2: 28 mEq/L (ref 19–32)
Calcium: 8.8 mg/dL (ref 8.4–10.5)
Chloride: 104 mEq/L (ref 96–112)
Creatinine, Ser: 0.74 mg/dL (ref 0.50–1.35)
GFR calc Af Amer: >60 mL/min (ref >60)
GFR calc non Af Amer: >60 mL/min (ref >60)
Glucose, Bld: 122 mg/dL — ABNORMAL HIGH (ref 70–99)
Potassium: 4.4 mEq/L (ref 3.5–5.1)
Sodium: 141 mEq/L (ref 135–145)
Total Bilirubin: 0.4 mg/dL (ref 0.3–1.2)
Total Protein: 6.9 g/dL (ref 6.0–8.3)

## 2011-01-15 LAB — CBC
HCT: 37.5 % — ABNORMAL LOW (ref 39.0–52.0)
Hemoglobin: 12.8 g/dL — ABNORMAL LOW (ref 13.0–17.0)
MCH: 29.6 pg (ref 26.0–34.0)
MCHC: 34.1 g/dL (ref 30.0–36.0)
MCV: 86.6 fL (ref 78.0–100.0)
Platelets: 257 10*3/uL (ref 150–400)
RBC: 4.33 MIL/uL (ref 4.22–5.81)
RDW: 13.7 % (ref 11.5–15.5)
WBC: 8.2 10*3/uL (ref 4.0–10.5)

## 2011-01-15 LAB — CARDIAC PANEL(CRET KIN+CKTOT+MB+TROPI)
CK, MB: 2.4 ng/mL (ref 0.3–4.0)
CK, MB: 1.8 ng/mL (ref 0.3–4.0)
Relative Index: INVALID (ref 0.0–2.5)
Total CK: 89 U/L (ref 7–232)
Total CK: 64 U/L (ref 7–232)
Troponin I: <0.3 ng/mL (ref <0.30)
Troponin I: <0.3 ng/mL (ref <0.30)

## 2011-01-15 LAB — GLUCOSE, CAPILLARY: Glucose-Capillary: 130 mg/dL — ABNORMAL HIGH (ref 70–99)

## 2011-01-15 MED ORDER — INSULIN ASPART 100 UNIT/ML ~~LOC~~ SOLN: 0.0000 [IU] | Freq: Every day | SUBCUTANEOUS | Status: DC

## 2011-01-15 MED ORDER — ENOXAPARIN SODIUM 40 MG/0.4ML ~~LOC~~ SOLN
40.0000 mg | Freq: Every day | SUBCUTANEOUS | Status: DC
  Administered 2011-01-15 – 2011-01-16 (×2): 40 mg via SUBCUTANEOUS
  Filled 2011-01-15 (×2): qty 0.4

## 2011-01-15 MED ORDER — SODIUM CHLORIDE 0.9 % IJ SOLN
INTRAMUSCULAR | Status: AC
  Filled 2011-01-15: qty 10

## 2011-01-15 MED ORDER — GLIPIZIDE 5 MG PO TABS
5.0000 mg | ORAL_TABLET | Freq: Two times a day (BID) | ORAL | Status: DC
  Administered 2011-01-16: 5 mg via ORAL
  Filled 2011-01-15: qty 1

## 2011-01-15 MED ORDER — NITROGLYCERIN 2 % TD OINT
1.0000 [in_us] | TOPICAL_OINTMENT | Freq: Three times a day (TID) | TRANSDERMAL | Status: DC
  Administered 2011-01-16 (×2): 1 [in_us] via TOPICAL
  Filled 2011-01-15 (×2): qty 1

## 2011-01-15 MED ORDER — INSULIN ASPART 100 UNIT/ML ~~LOC~~ SOLN: 0.0000 [IU] | Freq: Three times a day (TID) | SUBCUTANEOUS | Status: DC

## 2011-01-15 MED ORDER — SODIUM CHLORIDE 0.9 % IV SOLN
20.0000 mL | INTRAVENOUS | Status: DC
  Administered 2011-01-15: 20 mL via INTRAVENOUS

## 2011-01-15 MED ORDER — MORPHINE SULFATE CR 30 MG PO TB12
30.0000 mg | ORAL_TABLET | Freq: Two times a day (BID) | ORAL | Status: DC
  Administered 2011-01-15 – 2011-01-16 (×2): 30 mg via ORAL
  Filled 2011-01-15 (×2): qty 1

## 2011-01-15 MED ORDER — SODIUM CHLORIDE 0.9 % IV SOLN
INTRAVENOUS | Status: DC
  Administered 2011-01-15 – 2011-01-16 (×2): via INTRAVENOUS

## 2011-01-15 MED ORDER — NITROGLYCERIN 0.4 MG SL SUBL
0.4000 mg | SUBLINGUAL_TABLET | Freq: Once | SUBLINGUAL | Status: AC
  Administered 2011-01-15: 0.4 mg via SUBLINGUAL

## 2011-01-15 MED ORDER — NITROGLYCERIN 0.4 MG SL SUBL: 0.4000 mg | SUBLINGUAL_TABLET | SUBLINGUAL | Status: DC | PRN

## 2011-01-15 MED ORDER — NITROGLYCERIN 2 % TD OINT
1.0000 [in_us] | TOPICAL_OINTMENT | Freq: Once | TRANSDERMAL | Status: AC
  Administered 2011-01-15: 1 [in_us] via TOPICAL
  Filled 2011-01-15: qty 1

## 2011-01-15 NOTE — ED Notes (Signed)
Patient c/o chest pain intermittent chest pain x2 days. Patient walking this morning when left sided chest pain started again. Patient reports pain radiating into left side of neck. Patient brought in via EMS. Patient was given aspirin 324mg  po and nitro. On EMS arrival patient rated pain at a 4. Denies any at this time.

## 2011-01-15 NOTE — ED Notes (Signed)
Pt sitting in bed watching TV. Reports he has no pain at present. Airway patent, no acute distress noted.

## 2011-01-15 NOTE — ED Notes (Signed)
Patient resting quietly in bed. Airway patent. Respirations even and nonlabored. No acute distress noted. Patient states "I'm starting to have a little pain in my chest." Rates pain at a 2 out of 10. EDP made aware, verbal order given. No verbalized requests given.

## 2011-01-15 NOTE — Telephone Encounter (Signed)
I was called by Dr. Estell Harpin, who was evaluating [name] in the emergency department for chest discomfort.  Initial cardiac markers and EKG were negative.  Patient has a history of cardiomyopathy, but ejection fraction at his last catheterization in 04/2010 was normal as was coronary angiography.  He has no apparent cardiac cause for recurrent chest discomfort.  I advised the Emergency Department physician that if symptoms could be controlled medically, there was no reason to admit to hospital to rule out myocardial infarction.  Dr. Andee Lineman is his primary cardiologist and will see him in the future as needed.

## 2011-01-15 NOTE — H&P (Signed)
PCP:   Patient is followed at the [facility] by a Dr.Risque (sp)  Chief Complaint:  Chest pain  HPI: Patient is a 55 year old white male with past mental history of hypertension with secondary neuropathy as well as hypertension who presented to the emergency room after having chest pain. The patient was previously worked up for chest pain in November of 2011 and at that time underwent a cardiac catheterization noting normal ejection fraction no signs of hypokinesis, some valvular abnormalities and no signs of any ischemia. He was noted to have 30% plaque in one vessel but not felt to be contributing factor to his chest pain at that time. Since that time patient has been embarking on any exercise plan a progressive walking due to his diabetes better under control. He said he had noted some occasional episodes of midsternal chest pain but had noted most significantly, chest pain starting when he woke up located midsternal some radiation to the left side of his neck he also says he felt stressed that. He also says that he felt some shortness of breath when exerting himself and that seemed to make the chest pain worse. He said that on rest the chest pain improved. Paramedics were called and he received nitroglycerin as well as again the emergency room both times improving his chest pain symptoms. He currently states he is having no pain. He denies any other radiation, and described the chest pain as a combination of sharp and pressure vaguely discomforting about a 4-5/10.  Allergies:   Allergies  Allergen Reactions  . Codeine   . Peanuts (Nuts)       Past Medical History  Diagnosis Date  . Diabetes mellitus       . Back pain, chronic   . Angina         Past Surgical History  Procedure Date  . Back surgery     x3  . Appendectomy   . Cholecystectomy   . Cardiac surgery   . Thumb fusion   . Elbow surgery   . Ankle surgery     Prior to Admission medications   Medication Sig Start Date  End Date Taking? Authorizing Provider  glipiZIDE (GLUCOTROL) 5 MG tablet Take 5 mg by mouth 2 (two) times daily before a meal.     Yes Historical Provider, MD  morphine (KADIAN) 30 MG 24 hr capsule Take 30 mg by mouth 2 (two) times daily.     Yes Historical Provider, MD    Social History:  reports that he has never smoked. He has never used smokeless tobacco. He reports that he does not drink alcohol or use illicit drugs.  Family History  Problem Relation Age of Onset  . Stroke Other     Review of Systems:  BP 148/80  Pulse 79  Temp(Src) 98 F (36.7 C) (Oral)  Resp 20  Ht 5\' 11"  (1.803 m)  Wt 113.4 kg (250 lb)  BMI 34.87 kg/m2  SpO2 96%  General Appearance:    Alert, cooperative, no distress, appears stated age  Head:    Normocephalic, without obvious abnormality, atraumatic           Throat:   Lips, mucosa, and tongue normal; teeth and gums normal  Neck:   Supple, symmetrical, trachea midline, no adenopathy;       thyroid:  No enlargement/tenderness/nodules; no carotid   bruit or JVD     Lungs:     Clear to auscultation bilaterally, respirations unlabored  Chest wall:  No tenderness or deformity  Heart:    Regular rate and rhythm, S1 and S2 normal, no murmur, rub   or gallop  Abdomen:     Soft, non-tender, bowel sounds active all four quadrants,    no masses, no organomegaly        Extremities:   Extremities normal, atraumatic, no cyanosis or edema  Pulses:   2+ and symmetric all extremities  Skin:   Skin color, texture, turgor normal, no rashes or lesions           Physical Exam:  Labs on Admission:  Results for orders placed during the hospital encounter of 01/15/11 (from the past 48 hour(s))  CBC     Status: Abnormal   Collection Time   01/15/11  7:59 AM      Component Value Range Comment   WBC 8.2  4.0 - 10.5 (K/uL)    RBC 4.33  4.22 - 5.81 (MIL/uL)    Hemoglobin 12.8 (*) 13.0 - 17.0 (g/dL)    HCT 16.1 (*) 09.6 - 52.0 (%)    MCV 86.6  78.0 - 100.0  (fL)    MCH 29.6  26.0 - 34.0 (pg)    MCHC 34.1  30.0 - 36.0 (g/dL)    RDW 04.5  40.9 - 81.1 (%)    Platelets 257  150 - 400 (K/uL)   CARDIAC PANEL(CRET KIN+CKTOT+MB+TROPI)     Status: Normal   Collection Time   01/15/11  7:59 AM      Component Value Range Comment   Total CK 89  7 - 232 (U/L)    CK, MB 2.4  0.3 - 4.0 (ng/mL)    Troponin I <0.30  <0.30 (ng/mL)    Relative Index RELATIVE INDEX IS INVALID  0.0 - 2.5    COMPREHENSIVE METABOLIC PANEL     Status: Abnormal   Collection Time   01/15/11  7:59 AM      Component Value Range Comment   Sodium 141  135 - 145 (mEq/L)    Potassium 4.4  3.5 - 5.1 (mEq/L)    Chloride 104  96 - 112 (mEq/L)    CO2 28  19 - 32 (mEq/L)    Glucose, Bld 122 (*) 70 - 99 (mg/dL)    BUN 12  6 - 23 (mg/dL)    Creatinine, Ser 9.14  0.50 - 1.35 (mg/dL)    Calcium 8.8  8.4 - 10.5 (mg/dL)    Total Protein 6.9  6.0 - 8.3 (g/dL)    Albumin 3.8  3.5 - 5.2 (g/dL)    AST 37  0 - 37 (U/L)    ALT 48  0 - 53 (U/L)    Alkaline Phosphatase 51  39 - 117 (U/L)    Total Bilirubin 0.4  0.3 - 1.2 (mg/dL)    GFR calc non Af Amer >60  >60 (mL/min)    GFR calc Af Amer >60  >60 (mL/min)     Radiological Exams on Admission: No results found.  Assessment/Plan Principal Problem:  *Chest pain on exertion: Given normal cardiac catheterization 8 months ago, abnormal EKG and enzymes on admission plus atypical history, would favor overnight observation checking enzymes x3. The patient's symptoms are improved and workup was negative, can discharge home with outpatient followup. Active Problems:  Diabetes mellitus type 2 with neurological manifestations: Stable, with no complaints of any current neuropathy. I counseled him on eye exams checking for wounds of which he says he only does that. Continue to encourage  dietary and exercise compliance.  HTN (hypertension): Stable, continue to encourage diet and exercise.   Chandra Asher K 01/15/2011, 6:00 PM

## 2011-01-15 NOTE — ED Notes (Signed)
Patient's pain reevaluated-patient states "It went away." EDP in room to talk to patient.

## 2011-01-15 NOTE — ED Provider Notes (Signed)
History     Chief Complaint  Patient presents with  . Chest Pain   Patient is a 55 y.o. male presenting with chest pain. The history is provided by the patient. No language interpreter was used.  Chest Pain Episode onset: 2 days ago but worse this AM while walking. Duration of episode(s) is 20 minutes. Chest pain occurs intermittently. The chest pain is improving. The pain is associated with exertion. The severity of the pain is moderate. The quality of the pain is described as pressure-like. Radiates to: left neck. Exacerbated by: nothing. Primary symptoms include shortness of breath. Pertinent negatives for primary symptoms include no fever, no fatigue, no cough, no abdominal pain, no nausea, no vomiting and no dizziness.  Associated symptoms include diaphoresis. He tried nitroglycerin and aspirin for the symptoms. Risk factors include obesity and male gender.  His past medical history is significant for diabetes, hyperlipidemia and MI.  Pertinent negatives for past medical history include no seizures. Procedure history comments: Negative for stress test.   BIB EMS with c/o intermittent left sided chest pressure radiating to left neck onset 2 days ago but worse this morning while walking. Chest pressure this morning also presented with associated SOB and diaphoresis with the symptoms lasting 15-20 minutes. States he sat down following onset of chest pressure and SOB and symptoms improved significantly. States 2 nitroglycerin were used after sitting down with increased improvement of chest pressure.  Reports he had an MI approximately 8 months ago which presented with similar symptoms. States no stent was placed during treatment of MI and was told he had a 30% blockage at that time. Notes he usually takes 1 Aspirin-325mg  daily but had not taken the Aspirin prior to walking this AM. Patient given 4x81mg  Aspirin upon arrival at ED. No other complaints.  Patient seen at 7:58AM Past Medical History    Diagnosis Date  . Diabetes mellitus   . High cholesterol   . MI (myocardial infarction)     History reviewed. No pertinent past surgical history.  History reviewed. No pertinent family history.  History  Substance Use Topics  . Smoking status: Not on file  . Smokeless tobacco: Not on file  . Alcohol Use:       Review of Systems  Constitutional: Positive for diaphoresis. Negative for fever, chills and fatigue.  HENT: Positive for neck pain. Negative for congestion, sinus pressure and ear discharge.   Eyes: Negative for pain and discharge.  Respiratory: Positive for shortness of breath. Negative for cough.   Cardiovascular: Positive for chest pain.  Gastrointestinal: Negative for nausea, vomiting, abdominal pain and diarrhea.  Genitourinary: Negative for dysuria, frequency and hematuria.  Musculoskeletal: Negative for back pain.  Skin: Negative for rash.  Neurological: Negative for dizziness and seizures.  Hematological: Negative.   Psychiatric/Behavioral: Negative for hallucinations.    Physical Exam  BP 136/78  Pulse 83  Temp(Src) 98.2 F (36.8 C) (Oral)  Resp 18  Ht 5\' 11"  (1.803 m)  Wt 245 lb (111.131 kg)  BMI 34.17 kg/m2  SpO2 94%  Physical Exam  Nursing note and vitals reviewed. Constitutional: He is oriented to person, place, and time. Vital signs are normal. He appears well-developed and well-nourished.  HENT:  Head: Normocephalic and atraumatic.  Eyes: Conjunctivae and EOM are normal. Pupils are equal, round, and reactive to light. No scleral icterus.  Neck: Normal range of motion. Neck supple. No thyromegaly present.  Cardiovascular: Normal rate, regular rhythm, normal heart sounds, intact distal pulses and normal  pulses.  Exam reveals no gallop and no friction rub.   No murmur heard. Pulmonary/Chest: Effort normal and breath sounds normal. No stridor. He has no wheezes. He has no rales. He exhibits no tenderness.  Abdominal: Soft. Bowel sounds are  normal. He exhibits no distension. There is no tenderness. There is no rebound.  Musculoskeletal: Normal range of motion. He exhibits no edema.  Lymphadenopathy:    He has no cervical adenopathy.  Neurological: He is alert and oriented to person, place, and time. No sensory deficit. Coordination normal.  Skin: Skin is warm and dry. No rash noted. No erythema.  Psychiatric: He has a normal mood and affect. His behavior is normal.    ED Course   Procedures 10:06 AM Patient reports chest pain has improved. Lab and imaging results reviewed and explained with patient. Informed of intent to consult with cardiologist. Patient agrees with plan of action at this time. 10:08 AM Patient and patient's spouse discussed location of possible admission at physician request and determine admission at [facility] would [facility] best for them. MDM Spoke with cardiolgy and it was felt medicine could admit Results for orders placed during the hospital encounter of 01/15/11  CBC      Component Value Range   WBC 8.2  4.0 - 10.5 (K/uL)   RBC 4.33  4.22 - 5.81 (MIL/uL)   Hemoglobin 12.8 (*) 13.0 - 17.0 (g/dL)   HCT 16.1 (*) 09.6 - 52.0 (%)   MCV 86.6  78.0 - 100.0 (fL)   MCH 29.6  26.0 - 34.0 (pg)   MCHC 34.1  30.0 - 36.0 (g/dL)   RDW 04.5  40.9 - 81.1 (%)   Platelets 257  150 - 400 (K/uL)  CARDIAC PANEL(CRET KIN+CKTOT+MB+TROPI)      Component Value Range   Total CK 89  7 - 232 (U/L)   CK, MB 2.4  0.3 - 4.0 (ng/mL)   Troponin I <0.30  <0.30 (ng/mL)   Relative Index RELATIVE INDEX IS INVALID  0.0 - 2.5   COMPREHENSIVE METABOLIC PANEL      Component Value Range   Sodium 141  135 - 145 (mEq/L)   Potassium 4.4  3.5 - 5.1 (mEq/L)   Chloride 104  96 - 112 (mEq/L)   CO2 28  19 - 32 (mEq/L)   Glucose, Bld 122 (*) 70 - 99 (mg/dL)   BUN 12  6 - 23 (mg/dL)   Creatinine, Ser 9.14  0.50 - 1.35 (mg/dL)   Calcium 8.8  8.4 - 78.2 (mg/dL)   Total Protein 6.9  6.0 - 8.3 (g/dL)   Albumin 3.8  3.5 - 5.2 (g/dL)   AST 37   0 - 37 (U/L)   ALT 48  0 - 53 (U/L)   Alkaline Phosphatase 51  39 - 117 (U/L)   Total Bilirubin 0.4  0.3 - 1.2 (mg/dL)   GFR calc non Af Amer >60  >60 (mL/min)   GFR calc Af Amer >60  >60 (mL/min)   Dg Chest Portable 1 View  01/15/2011  *RADIOLOGY REPORT*  Clinical Data: Chest pain, shortness of breath, history of myocardial infarction  PORTABLE CHEST - 1 VIEW  Comparison: Portable exam 0810 hours without priors for comparison.  Findings: Enlargement of cardiac silhouette. Mediastinal contours and pulmonary vascularity normal. Lungs clear. No pleural effusion or pneumothorax. Bones unremarkable.  IMPRESSION: Mild enlargement of cardiac silhouette. No acute abnormalities.  Original Report Authenticated By: Lollie Marrow, M.D.    Chart written by  Clarita Crane acting as scribe for Dr. Estell Harpin  I personally performed the services described in this documentation, which was scribed in my presence. The recorded information has been reviewed and considered.   Benny Lennert, MD 01/15/11 1146

## 2011-01-15 NOTE — ED Notes (Signed)
Paged Dr. Rito Ehrlich for Dr. Estell Harpin again.

## 2011-01-16 ENCOUNTER — Encounter: Payer: Self-pay | Admitting: Internal Medicine

## 2011-01-16 LAB — GLUCOSE, CAPILLARY
Glucose-Capillary: 113 mg/dL — ABNORMAL HIGH (ref 70–99)
Glucose-Capillary: 128 mg/dL — ABNORMAL HIGH (ref 70–99)

## 2011-01-16 MED ORDER — ACETAMINOPHEN 325 MG PO TABS
650.0000 mg | ORAL_TABLET | Freq: Three times a day (TID) | ORAL | Status: DC | PRN
  Administered 2011-01-16: 650 mg via ORAL
  Filled 2011-01-16: qty 2

## 2011-01-16 NOTE — Discharge Summary (Signed)
Physician Discharge Summary  Patient ID: SHAIN PAUWELS MRN: [id number] DOB/AGE: 10/19/1955 55 y.o.  Admit date: 01/15/2011 Discharge date: 01/16/2011  Primary Care Physician:   Dr.Samir Marnette Burgess [phone] Fax: [phone] Discharge Diagnoses:    Patient Active Problem List  Diagnoses  . Diabetes mellitus type 2 with neurological manifestations  . Chest pain on exertion  . HTN (hypertension)    Current Discharge Medication List    CONTINUE these medications which have NOT CHANGED   Details  glipiZIDE (GLUCOTROL) 5 MG tablet Take 5 mg by mouth 2 (two) times daily before a meal.      morphine (KADIAN) 30 MG 24 hr capsule Take 30 mg by mouth 2 (two) times daily.             aspirin 325 MG EC tablet      metFORMIN (GLUCOPHAGE) 1000 MG tablet          Disposition and Follow-up:  Disposition: Improved as his chest pain is now resolved. Discharge to home. Discharge diet carb modified low sodium. Activity: As tolerated. Followup: Patient is advised to follow up with his PCP as scheduled and sooner if his symptoms persist now that the life-threatening causes have been ruled out.  Consults:  None.   Significant Diagnostic Studies:  Dg Chest Portable 1 View   IMPRESSION: Mild enlargement of cardiac silhouette. No acute abnormalities.     Hospital Course:  Principal Problem:  *Chest pain on exertion: Patient's EKG and enzymes x3 were negative. Given the fact that he had a cardiac catheterization done in November of 2011 which showed minimal disease in one-vessel, and his presentation was atypical plus workup during this hospitalization was negative, his chest pain is not felt to be related to anything cardiac in nature. Active Problems:  Diabetes mellitus type 2 with neurological manifestations: Stable continue home medications and continue with diet and exercise regimen.  HTN (hypertension): Stable, continue home medications as well as continue diet and  exercise.    SignedHollice Espy 01/16/2011, 11:18 AM

## 2011-01-16 NOTE — Discharge Summary (Signed)
Iv discontinued with tip intact.  Discharge instructions given to patient. Pt verbalized understanding of discharge instructions.  Per patient he will call the [place] [facility] in [place] and set up an appointment with Tommi Rumps. Neale Burly.  He said it usually takes about 3 weeks to get an appointment.  Pt ready for discharge.  No chest pain or acute distress noted.

## 2011-01-16 NOTE — Progress Notes (Signed)
UR Chart Review Completed  

## 2011-02-26 NOTE — Progress Notes (Signed)
Encounter addended by: Karen Kays on: 02/26/2011  9:41 AM<BR>     Documentation filed: Flowsheet VN

## 2011-04-15 LAB — CBC
HCT: 33.6 — ABNORMAL LOW
Hemoglobin: 11.5 — ABNORMAL LOW
MCHC: 34.2
MCHC: 34.3
MCV: 86
MCV: 87.4
Platelets: 220
RDW: 13.7
WBC: 7.8

## 2011-04-15 LAB — CARDIAC PANEL(CRET KIN+CKTOT+MB+TROPI)
CK, MB: 1.1
Relative Index: INVALID
Troponin I: 0.01

## 2011-04-15 LAB — TSH: TSH: 0.798

## 2011-04-15 LAB — HEPARIN LEVEL (UNFRACTIONATED)
Heparin Unfractionated: 0.31
Heparin Unfractionated: 0.48

## 2011-04-15 LAB — CK TOTAL AND CKMB (NOT AT ARMC)
CK, MB: 1.4
Total CK: 96

## 2011-04-15 LAB — LIPID PANEL: HDL: 26 — ABNORMAL LOW

## 2011-04-15 LAB — HEPATIC FUNCTION PANEL
AST: 23
Bilirubin, Direct: 0.1
Total Bilirubin: 0.4

## 2011-06-04 ENCOUNTER — Other Ambulatory Visit: Payer: Self-pay

## 2011-06-04 ENCOUNTER — Encounter (HOSPITAL_COMMUNITY): Payer: Self-pay | Admitting: Emergency Medicine

## 2011-06-04 ENCOUNTER — Emergency Department (HOSPITAL_COMMUNITY)
Admission: EM | Admit: 2011-06-04 | Discharge: 2011-06-04 | Disposition: A | Discharge status: Home / Self Care | Payer: Medicare Other | Attending: Emergency Medicine | Admitting: Emergency Medicine

## 2011-06-04 ENCOUNTER — Emergency Department (HOSPITAL_COMMUNITY): Payer: Medicare Other

## 2011-06-04 DIAGNOSIS — I252 Old myocardial infarction: Secondary | ICD-10-CM | POA: Diagnosis not present

## 2011-06-04 DIAGNOSIS — G8929 Other chronic pain: Secondary | ICD-10-CM | POA: Insufficient documentation

## 2011-06-04 DIAGNOSIS — Z9889 Other specified postprocedural states: Secondary | ICD-10-CM | POA: Diagnosis not present

## 2011-06-04 DIAGNOSIS — E1142 Type 2 diabetes mellitus with diabetic polyneuropathy: Secondary | ICD-10-CM | POA: Diagnosis not present

## 2011-06-04 DIAGNOSIS — I509 Heart failure, unspecified: Secondary | ICD-10-CM | POA: Diagnosis not present

## 2011-06-04 DIAGNOSIS — M549 Dorsalgia, unspecified: Secondary | ICD-10-CM | POA: Insufficient documentation

## 2011-06-04 DIAGNOSIS — I10 Essential (primary) hypertension: Secondary | ICD-10-CM | POA: Diagnosis not present

## 2011-06-04 DIAGNOSIS — R079 Chest pain, unspecified: Secondary | ICD-10-CM | POA: Diagnosis not present

## 2011-06-04 DIAGNOSIS — E1149 Type 2 diabetes mellitus with other diabetic neurological complication: Secondary | ICD-10-CM | POA: Insufficient documentation

## 2011-06-04 LAB — POCT I-STAT TROPONIN I: Troponin i, poc: 0 ng/mL (ref 0.00–0.08)

## 2011-06-04 LAB — COMPREHENSIVE METABOLIC PANEL
Albumin: 3.9 g/dL (ref 3.5–5.2)
BUN: 10 mg/dL (ref 6–23)
Creatinine, Ser: 0.76 mg/dL (ref 0.50–1.35)
Total Protein: 8.3 g/dL (ref 6.0–8.3)

## 2011-06-04 LAB — CARDIAC PANEL(CRET KIN+CKTOT+MB+TROPI)
CK, MB: 1.9 ng/mL (ref 0.3–4.0)
Total CK: 62 U/L (ref 7–232)
Troponin I: <0.3 ng/mL (ref <0.30)

## 2011-06-04 LAB — CBC
Platelets: 289 10*3/uL (ref 150–400)
RDW: 13.8 % (ref 11.5–15.5)
WBC: 9.3 10*3/uL (ref 4.0–10.5)

## 2011-06-04 MED ORDER — NITROGLYCERIN 0.4 MG SL SUBL
0.4000 mg | SUBLINGUAL_TABLET | SUBLINGUAL | Status: DC | PRN
  Administered 2011-06-04: 0.4 mg via SUBLINGUAL
  Filled 2011-06-04: qty 75

## 2011-06-04 MED ORDER — ASPIRIN 81 MG PO CHEW
324.0000 mg | CHEWABLE_TABLET | Freq: Once | ORAL | Status: AC
  Administered 2011-06-04: 324 mg via ORAL
  Filled 2011-06-04: qty 4

## 2011-06-04 MED ORDER — SODIUM CHLORIDE 0.9 % IJ SOLN: 3.0000 mL | Freq: Two times a day (BID) | INTRAMUSCULAR | Status: DC

## 2011-06-04 NOTE — ED Provider Notes (Signed)
History   This chart was scribed for [employer]. Colon Branch, MD by Sofie Rower. The patient was seen in room [facility] and the patient's care was started at 12:50PM.    CSN: [id number] Arrival date & time: 06/04/2011 10:09 AM   First MD Initiated Contact with Patient 06/04/11 1221      Chief Complaint  Patient presents with  . Chest Pain  . Shortness of Breath    (Consider location/radiation/quality/duration/timing/severity/associated sxs/prior treatment) HPI  Dalton Porter is a 55 y.o. male who presents to the Emergency Department complaining of moderate, intermittent chest pain described as pressure radiating to left lateral neck onset 3 days ago with associated symptoms of SOB, headache and nausea. States chest pain is currently resolved but left lateral neck pain persists. States chest/neck pain is aggravated by nothing.  Pt. Denies cough, fever, chills, diaphoresis. Pt. Has a history of MI and states chest pain experienced was similar to chest pain experienced with previous MI. States he had a cardiac catherization performed last year associated with minor MI which showed a 30% blockage.    Past Medical History  Diagnosis Date  . Diabetes mellitus   . MI (myocardial infarction)   . Back pain, chronic   . Angina   . CHF (congestive heart failure)     Past Surgical History  Procedure Date  . Back surgery     x3  . Appendectomy   . Cholecystectomy   . Cardiac surgery   . Thumb fusion   . Elbow surgery   . Ankle surgery     Family History  Problem Relation Age of Onset  . Stroke Other     History  Substance Use Topics  . Smoking status: Never Smoker   . Smokeless tobacco: Never Used  . Alcohol Use: No      Review of Systems  10 Systems reviewed and are negative for acute change except as noted in the HPI.   Allergies  Codeine and Peanuts  Home Medications   Current Outpatient Rx  Name Route Sig Dispense Refill  . GLIPIZIDE 5 MG PO TABS Oral Take 5 mg by  mouth 2 (two) times daily before a meal.      . METFORMIN HCL 500 MG PO TABS Oral Take 500 mg by mouth 2 (two) times daily with a meal.      . MORPHINE SULFATE ER 30 MG PO CP24 Oral Take 30 mg by mouth 2 (two) times daily.      . TRAZODONE HCL 150 MG PO TABS Oral Take 150 mg by mouth at bedtime as needed and may repeat dose one time if needed. Sleep aid       BP 145/82  Pulse 82  Temp(Src) 98.8 F (37.1 C) (Oral)  Resp 18  Ht 5\' 11"  (1.803 m)  Wt 236 lb (107.049 kg)  BMI 32.92 kg/m2  SpO2 96%  Physical Exam  Nursing note and vitals reviewed. Constitutional: He is oriented to person, place, and time. He appears well-developed and well-nourished. No distress.  HENT:  Head: Normocephalic and atraumatic.  Right Ear: External ear normal.  Left Ear: External ear normal.  Mouth/Throat: Oropharynx is clear and moist.       TMs normal bilaterally.   Eyes: EOM are normal. Pupils are equal, round, and reactive to light.  Neck: Normal range of motion. Neck supple. Carotid bruit is not present. No tracheal deviation present.       Mild Tenderness to posterior SCM.  Cardiovascular: Normal rate, regular rhythm and normal heart sounds.  Exam reveals no gallop and no friction rub.   No murmur heard. Pulmonary/Chest: Effort normal and breath sounds normal. No respiratory distress.  Abdominal: Soft. Bowel sounds are normal. He exhibits no distension. There is no tenderness.       Obese. Unable to palpate organs.   Musculoskeletal: Normal range of motion. He exhibits no edema.  Neurological: He is alert and oriented to person, place, and time. No sensory deficit.  Skin: Skin is warm and dry.  Psychiatric: He has a normal mood and affect. His behavior is normal.    ED Course  Procedures (including critical care time)  DIAGNOSTIC STUDIES: Oxygen Saturation is 98% on room air, normal by my interpretation.    COORDINATION OF CARE: 12:55PM-EDP at bedside discusses treatment plan.  12:57PM-EDP  orders sublingual nitroglycerin.  2:04PM- Nurse informs physician that patient is experiencing recurrence of chest pain at this time. Will administer NTG to patient and observe reaction.   Results for orders placed during the hospital encounter of 06/04/11  CBC      Component Value Range   WBC 9.3  4.0 - 10.5 (K/uL)   RBC 4.71  4.22 - 5.81 (MIL/uL)   Hemoglobin 13.6  13.0 - 17.0 (g/dL)   HCT 16.1  09.6 - 04.5 (%)   MCV 86.4  78.0 - 100.0 (fL)   MCH 28.9  26.0 - 34.0 (pg)   MCHC 33.4  30.0 - 36.0 (g/dL)   RDW 40.9  81.1 - 91.4 (%)   Platelets 289  150 - 400 (K/uL)  CARDIAC PANEL(CRET KIN+CKTOT+MB+TROPI)      Component Value Range   Total CK 62  7 - 232 (U/L)   CK, MB 1.9  0.3 - 4.0 (ng/mL)   Troponin I <0.30  <0.30 (ng/mL)   Relative Index RELATIVE INDEX IS INVALID  0.0 - 2.5   COMPREHENSIVE METABOLIC PANEL      Component Value Range   Sodium 140  135 - 145 (mEq/L)   Potassium 3.9  3.5 - 5.1 (mEq/L)   Chloride 99  96 - 112 (mEq/L)   CO2 32  19 - 32 (mEq/L)   Glucose, Bld 141 (*) 70 - 99 (mg/dL)   BUN 10  6 - 23 (mg/dL)   Creatinine, Ser 7.82  0.50 - 1.35 (mg/dL)   Calcium 95.6  8.4 - 10.5 (mg/dL)   Total Protein 8.3  6.0 - 8.3 (g/dL)   Albumin 3.9  3.5 - 5.2 (g/dL)   AST 28  0 - 37 (U/L)   ALT 37  0 - 53 (U/L)   Alkaline Phosphatase 80  39 - 117 (U/L)   Total Bilirubin 0.3  0.3 - 1.2 (mg/dL)   GFR calc non Af Amer >90  >90 (mL/min)   GFR calc Af Amer >90  >90 (mL/min)  POCT I-STAT TROPONIN I      Component Value Range   Troponin i, poc 0.00  0.00 - 0.08 (ng/mL)   Comment 3               Dg Chest 2 View  06/04/2011  *RADIOLOGY REPORT*  Clinical Data: Chest pain.  CHEST - 2 VIEW  Comparison: 01/15/2011  Findings: Heart and mediastinal contours are within normal limits. No focal opacities or effusions.  No acute bony abnormality.  IMPRESSION: No active cardiopulmonary disease.  Original Report Authenticated By: Cyndie Chime, M.D.    Date: 06/04/2011 1016  Rate: 86  Rhythm: normal sinus rhythm  QRS Axis: normal  Intervals: normal  ST/T Wave abnormalities: normal  Conduction Disutrbances:none  Narrative Interpretation:   Old EKG Reviewed: unchanged c/w 01/15/11      MDM  Patient with previous work up for chest pain here with chest pain associated with shortness of breath x 3 days. Labs unremarkable, cardiac markers negative, xray negative.[id number] Spoke with Dr. Eden Emms, cardiology. He advised patient could be admitted to [facility] for r/o.1700 Discussed with Dr. Lendell Caprice, hospitalist, for possible admission to r/o with serial enzymes and have cardiology consult.She advised she would see/evaluate patient in the ER. 1759 Dr. Lendell Caprice has completed her consultation/evaluation of the patient. She does not feel he needs to be admitted at this time. He can follow up with [facility] cardiology as an outpatient for further work up. Discharged patient at her request.  I personally performed the services described in this documentation, which was scribed in my presence. The recorded information has been reviewed and considered.  MDM Reviewed: previous chart, nursing note and vitals Reviewed previous: labs, ECG and x-ray Interpretation: labs, ECG and x-ray Total time providing critical care: 50. Consults: cardiology (hospitalist.)           Nicoletta Dress. Colon Branch, MD 06/07/11 0000

## 2011-06-04 NOTE — ED Notes (Signed)
Pt c/o chest pain and sob x 3 days.  

## 2011-06-04 NOTE — Consult Note (Addendum)
Referring physician: Ellin Saba  Reason for consult: Chest pain  Date of Consult:  06/04/2011  Impression/Recommendation #1 recurrent noncardiac chest pain: The patient had a negative cardiac catheterization as he has a couple negative cardiac catheterizations over the past 10 years. His EKG is normal and cardiac enzymes are normal. He currently feels fine. The pain does not occur when he exercises daily on the treadmill. It occurred at rest today. He can be discharged home and followup with his primary care provider at the [place].  HPI Dalton Porter is an 55 y.o. male with a history of diabetes, recurrent chest pain with repeatedly negative cardiac catheterizations who presents today with intermittent chest pain that radiated to his left neck. It was left-sided, felt like pressure. It has occurred on and off for the past 3 days. According to the ED physician, his neck pain was reproducible with palpation. He's had no other symptoms. [facility] "to get checked out".  Past Medical History  Diagnosis Date  . Diabetes mellitus       . Back pain, chronic             Medications:   Medications Prior to Admission  Medication Sig Dispense Refill  . glipiZIDE (GLUCOTROL) 5 MG tablet Take 5 mg by mouth 2 (two) times daily before a meal.        . morphine (KADIAN) 30 MG 24 hr capsule Take 30 mg by mouth 2 (two) times daily.          Allergies:  Allergies  Allergen Reactions  . Codeine   . Peanuts (Nuts)     Social History:  reports that he has never smoked. He has never used smokeless tobacco. He reports that he does not drink alcohol or use illicit drugs.  Family History  Problem Relation Age of Onset  . Stroke Other     Past Surgical History  Procedure Date  . Back surgery     x3  . Appendectomy   . Cholecystectomy   . Cardiac surgery   . Thumb fusion   . Elbow surgery   . Ankle surgery    Systems reviewed. As above  otherwise negative.  Blood pressure 101/59, pulse 85, temperature 98.8 F (37.1 C), temperature source Oral, resp. rate 19, height 5\' 11"  (1.803 m), weight 107.049 kg (236 lb), SpO2 96.00%.  BP 101/59  Pulse 85  Temp(Src) 98.8 F (37.1 C) (Oral)  Resp 19  Ht 5\' 11"  (1.803 m)  Wt 107.049 kg (236 lb)  BMI 32.92 kg/m2  SpO2 96%  General Appearance:    Alert, cooperative, no distress, appears stated age. Comfortable. Watching TV.   Head:    Normocephalic, without obvious abnormality, atraumatic  Eyes:    PERRL, conjunctiva/corneas clear, EOM's intact, fundi    benign, both eyes          Nose:   Nares normal, septum midline, mucosa normal, no drainage   or sinus tenderness  Throat:   Lips, mucosa, and tongue normal; teeth and gums normal  Neck:   Supple, symmetrical, trachea midline, no adenopathy;       thyroid:  No enlargement/tenderness/nodules; no carotid   bruit or JVD  Back:     Symmetric, no curvature, ROM normal, no CVA tenderness  Lungs:     Clear to auscultation bilaterally, respirations unlabored  Chest wall:    No tenderness or deformity  Heart:  Regular rate and rhythm, S1 and S2 normal, no murmur, rub   or gallop  Abdomen:     Soft, non-tender, bowel sounds active all four quadrants,    no masses, no organomegaly  Genitalia:   deferred   Rectal:   deferred   Extremities:   Extremities normal, atraumatic, no cyanosis or edema  Pulses:   2+ and symmetric all extremities  Skin:   Skin color, texture, turgor normal, no rashes or lesions  Lymph nodes:   Cervical, supraclavicular, and axillary nodes normal  Neurologic:   CNII-XII intact. Normal strength, sensation and reflexes      throughout    Psychiatric: Normal affect   Results for orders placed during the hospital encounter of 06/04/11 (from the past 48 hour(s))  CBC     Status: Normal   Collection Time   06/04/11 10:23 AM      Component Value Range Comment   WBC 9.3  4.0 - 10.5 (K/uL)    RBC 4.71  4.22 -  5.81 (MIL/uL)    Hemoglobin 13.6  13.0 - 17.0 (g/dL)    HCT 40.9  81.1 - 91.4 (%)    MCV 86.4  78.0 - 100.0 (fL)    MCH 28.9  26.0 - 34.0 (pg)    MCHC 33.4  30.0 - 36.0 (g/dL)    RDW 78.2  95.6 - 21.3 (%)    Platelets 289  150 - 400 (K/uL)   CARDIAC PANEL(CRET KIN+CKTOT+MB+TROPI)     Status: Normal   Collection Time   06/04/11 10:23 AM      Component Value Range Comment   Total CK 62  7 - 232 (U/L)    CK, MB 1.9  0.3 - 4.0 (ng/mL)    Troponin I <0.30  <0.30 (ng/mL)    Relative Index RELATIVE INDEX IS INVALID  0.0 - 2.5    COMPREHENSIVE METABOLIC PANEL     Status: Abnormal   Collection Time   06/04/11 10:23 AM      Component Value Range Comment   Sodium 140  135 - 145 (mEq/L)    Potassium 3.9  3.5 - 5.1 (mEq/L)    Chloride 99  96 - 112 (mEq/L)    CO2 32  19 - 32 (mEq/L)    Glucose, Bld 141 (*) 70 - 99 (mg/dL)    BUN 10  6 - 23 (mg/dL)    Creatinine, Ser 0.86  0.50 - 1.35 (mg/dL)    Calcium 57.8  8.4 - 10.5 (mg/dL)    Total Protein 8.3  6.0 - 8.3 (g/dL)    Albumin 3.9  3.5 - 5.2 (g/dL)    AST 28  0 - 37 (U/L)    ALT 37  0 - 53 (U/L)    Alkaline Phosphatase 80  39 - 117 (U/L)    Total Bilirubin 0.3  0.3 - 1.2 (mg/dL)    GFR calc non Af Amer >90  >90 (mL/min)    GFR calc Af Amer >90  >90 (mL/min)   POCT I-STAT TROPONIN I     Status: Normal   Collection Time   06/04/11 10:28 AM      Component Value Range Comment   Troponin i, poc 0.00  0.00 - 0.08 (ng/mL)    Comment 3              Dg Chest 2 View  06/04/2011  *RADIOLOGY REPORT*  Clinical Data: Chest pain.  CHEST - 2 VIEW  Comparison: 01/15/2011  Findings:  Heart and mediastinal contours are within normal limits. No focal opacities or effusions.  No acute bony abnormality.  IMPRESSION: No active cardiopulmonary disease.  Original Report Authenticated By: Cyndie Chime, M.D.   EKG: Normal sinus rhythm  Layani Foronda L 06/04/2011, 5:51 PM

## 2016-04-06 DIAGNOSIS — Z7984 Long term (current) use of oral hypoglycemic drugs: Secondary | ICD-10-CM | POA: Diagnosis not present

## 2016-04-06 DIAGNOSIS — Z7982 Long term (current) use of aspirin: Secondary | ICD-10-CM | POA: Diagnosis not present

## 2016-04-06 DIAGNOSIS — R1310 Dysphagia, unspecified: Secondary | ICD-10-CM | POA: Diagnosis not present

## 2016-04-06 DIAGNOSIS — R079 Chest pain, unspecified: Secondary | ICD-10-CM | POA: Diagnosis not present

## 2016-04-06 DIAGNOSIS — E119 Type 2 diabetes mellitus without complications: Secondary | ICD-10-CM | POA: Diagnosis not present

## 2016-04-06 DIAGNOSIS — F419 Anxiety disorder, unspecified: Secondary | ICD-10-CM | POA: Diagnosis not present

## 2016-04-06 DIAGNOSIS — I219 Acute myocardial infarction, unspecified: Secondary | ICD-10-CM | POA: Diagnosis not present

## 2016-04-06 DIAGNOSIS — Z79899 Other long term (current) drug therapy: Secondary | ICD-10-CM | POA: Diagnosis not present

## 2016-04-06 DIAGNOSIS — G894 Chronic pain syndrome: Secondary | ICD-10-CM | POA: Diagnosis not present

## 2016-04-06 DIAGNOSIS — I252 Old myocardial infarction: Secondary | ICD-10-CM | POA: Diagnosis not present

## 2016-04-07 ENCOUNTER — Inpatient Hospital Stay (HOSPITAL_COMMUNITY)
Admission: AD | Admit: 2016-04-07 | Discharge: 2016-04-09 | DRG: 287 | Disposition: A | Discharge status: Home / Self Care | Payer: Medicare Other | Source: Other Acute Inpatient Hospital | Attending: Cardiovascular Disease | Admitting: Cardiovascular Disease

## 2016-04-07 ENCOUNTER — Encounter (HOSPITAL_COMMUNITY): Payer: Self-pay | Admitting: Cardiology

## 2016-04-07 DIAGNOSIS — I441 Atrioventricular block, second degree: Secondary | ICD-10-CM | POA: Diagnosis not present

## 2016-04-07 DIAGNOSIS — R079 Chest pain, unspecified: Secondary | ICD-10-CM | POA: Diagnosis not present

## 2016-04-07 DIAGNOSIS — Z823 Family history of stroke: Secondary | ICD-10-CM

## 2016-04-07 DIAGNOSIS — F419 Anxiety disorder, unspecified: Secondary | ICD-10-CM | POA: Diagnosis not present

## 2016-04-07 DIAGNOSIS — Z9049 Acquired absence of other specified parts of digestive tract: Secondary | ICD-10-CM | POA: Diagnosis not present

## 2016-04-07 DIAGNOSIS — I200 Unstable angina: Secondary | ICD-10-CM | POA: Diagnosis present

## 2016-04-07 DIAGNOSIS — Z91010 Allergy to peanuts: Secondary | ICD-10-CM

## 2016-04-07 DIAGNOSIS — G894 Chronic pain syndrome: Secondary | ICD-10-CM | POA: Diagnosis not present

## 2016-04-07 DIAGNOSIS — R9439 Abnormal result of other cardiovascular function study: Secondary | ICD-10-CM | POA: Diagnosis not present

## 2016-04-07 DIAGNOSIS — E1149 Type 2 diabetes mellitus with other diabetic neurological complication: Secondary | ICD-10-CM | POA: Diagnosis not present

## 2016-04-07 DIAGNOSIS — M549 Dorsalgia, unspecified: Secondary | ICD-10-CM | POA: Diagnosis present

## 2016-04-07 DIAGNOSIS — Z800 Family history of malignant neoplasm of digestive organs: Secondary | ICD-10-CM | POA: Diagnosis not present

## 2016-04-07 DIAGNOSIS — G8929 Other chronic pain: Secondary | ICD-10-CM | POA: Diagnosis present

## 2016-04-07 DIAGNOSIS — Z7984 Long term (current) use of oral hypoglycemic drugs: Secondary | ICD-10-CM | POA: Diagnosis not present

## 2016-04-07 DIAGNOSIS — Z7982 Long term (current) use of aspirin: Secondary | ICD-10-CM | POA: Diagnosis not present

## 2016-04-07 DIAGNOSIS — I420 Dilated cardiomyopathy: Secondary | ICD-10-CM | POA: Diagnosis present

## 2016-04-07 DIAGNOSIS — E119 Type 2 diabetes mellitus without complications: Secondary | ICD-10-CM | POA: Diagnosis not present

## 2016-04-07 DIAGNOSIS — I5042 Chronic combined systolic (congestive) and diastolic (congestive) heart failure: Secondary | ICD-10-CM | POA: Diagnosis present

## 2016-04-07 DIAGNOSIS — Z9889 Other specified postprocedural states: Secondary | ICD-10-CM | POA: Diagnosis not present

## 2016-04-07 DIAGNOSIS — Z886 Allergy status to analgesic agent status: Secondary | ICD-10-CM

## 2016-04-07 DIAGNOSIS — I428 Other cardiomyopathies: Secondary | ICD-10-CM | POA: Diagnosis not present

## 2016-04-07 HISTORY — DX: Other cardiomyopathies: I42.8

## 2016-04-07 LAB — GLUCOSE, CAPILLARY: Glucose-Capillary: 104 mg/dL — ABNORMAL HIGH (ref 65–99)

## 2016-04-07 MED ORDER — ATORVASTATIN CALCIUM 80 MG PO TABS
80.0000 mg | ORAL_TABLET | Freq: Every day | ORAL | Status: DC
  Administered 2016-04-08: 80 mg via ORAL
  Filled 2016-04-07: qty 1

## 2016-04-07 MED ORDER — NITROGLYCERIN 0.4 MG SL SUBL
0.4000 mg | SUBLINGUAL_TABLET | SUBLINGUAL | Status: DC | PRN
  Administered 2016-04-08: 0.4 mg via SUBLINGUAL

## 2016-04-07 MED ORDER — SODIUM CHLORIDE 0.9 % WEIGHT BASED INFUSION
3.0000 mL/kg/h | INTRAVENOUS | Status: DC
  Administered 2016-04-08: 3 mL/kg/h via INTRAVENOUS

## 2016-04-07 MED ORDER — CARVEDILOL 3.125 MG PO TABS: 3.1250 mg | ORAL_TABLET | Freq: Two times a day (BID) | ORAL | Status: DC

## 2016-04-07 MED ORDER — SODIUM CHLORIDE 0.9% FLUSH
3.0000 mL | Freq: Two times a day (BID) | INTRAVENOUS | Status: DC
  Administered 2016-04-08 (×2): 3 mL via INTRAVENOUS

## 2016-04-07 MED ORDER — SODIUM CHLORIDE 0.9% FLUSH: 3.0000 mL | INTRAVENOUS | Status: DC | PRN

## 2016-04-07 MED ORDER — SODIUM CHLORIDE 0.9 % WEIGHT BASED INFUSION
1.0000 mL/kg/h | INTRAVENOUS | Status: DC
  Administered 2016-04-08: 1 mL/kg/h via INTRAVENOUS

## 2016-04-07 MED ORDER — ACETAMINOPHEN 325 MG PO TABS
650.0000 mg | ORAL_TABLET | ORAL | Status: DC | PRN
  Administered 2016-04-07: 650 mg via ORAL
  Filled 2016-04-07: qty 2

## 2016-04-07 MED ORDER — INSULIN ASPART 100 UNIT/ML ~~LOC~~ SOLN: 0.0000 [IU] | Freq: Three times a day (TID) | SUBCUTANEOUS | Status: DC

## 2016-04-07 MED ORDER — ASPIRIN EC 81 MG PO TBEC: 81.0000 mg | DELAYED_RELEASE_TABLET | Freq: Every day | ORAL | Status: DC

## 2016-04-07 MED ORDER — ASPIRIN EC 81 MG PO TBEC
81.0000 mg | DELAYED_RELEASE_TABLET | Freq: Every day | ORAL | Status: DC
  Administered 2016-04-09: 81 mg via ORAL
  Filled 2016-04-07: qty 1

## 2016-04-07 MED ORDER — SODIUM CHLORIDE 0.9 % IV SOLN: 250.0000 mL | INTRAVENOUS | Status: DC | PRN

## 2016-04-07 MED ORDER — ASPIRIN 81 MG PO CHEW
81.0000 mg | CHEWABLE_TABLET | ORAL | Status: AC
  Administered 2016-04-08: 81 mg via ORAL
  Filled 2016-04-07: qty 1

## 2016-04-07 MED ORDER — ONDANSETRON HCL 4 MG/2ML IJ SOLN: 4.0000 mg | Freq: Four times a day (QID) | INTRAMUSCULAR | Status: DC | PRN

## 2016-04-07 NOTE — H&P (Signed)
Admit date: 04/07/2016 Referring Physician: Dr. 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  He has a history of cath in 2002 that showed normal coronary arteries and mild LV dysfunction with DCM and EF 40%.  According to the records he also had a cath 3 years ago but I do not have records of that and is followed at [facility].  He says that he was yesterday up around 5am and started having an "ice pick" pain in his chest that went around to his neck and back.  It was somewhat stabbing as well but then became a pressure.  It lasted a few hours and he decided he needed to go to the ER.  [facility] and initial trop was normal and EKG showed NSR with tele showing intermittent wenkebach heart block.  He underwent nuclear stress test today showing a large area of reversibility in the inferior wall with severe diffuse hypokinesis and EF 29%.  [facility]. He currently denies any chest pain or SOB.   PMH:    Past Medical History:  Diagnosis Date  . Back pain, chronic   . Diabetes mellitus     PSH:    Past Surgical History:  Procedure Laterality Date  . ANKLE SURGERY    . APPENDECTOMY    . BACK SURGERY     x3  . CARDIAC CATHETERIZATION  2002  . CHOLECYSTECTOMY    . ELBOW SURGERY    . THUMB FUSION      ALLERGIES:   Codeine and Peanuts [nuts]  Prior to Admit Meds:   Prescriptions Prior to Admission  Medication Sig Dispense Refill Last Dose  . metFORMIN (GLUCOPHAGE) 500 MG tablet Take 500 mg by mouth 2 (two) times daily with a meal.     06/04/2011 at Unknown  . morphine (KADIAN) 30 MG 24 hr capsule Take 30 mg by mouth 2 (two) times daily.     06/04/2011  at Unknown  . glipiZIDE (GLUCOTROL) 5 MG tablet Take 5 mg by mouth 2 (two) times daily before a meal.     06/04/2011 at Unknown   Family HX:    Family History  Problem Relation Age of Onset  . Stroke Other   . Pancreatic cancer Father    Social HX:    Social History   Social History  . Marital status: Married    Spouse name: N/A  . Number of children: N/A  . Years of education: N/A   Occupational History  . Not on file.   Social History Main Topics  . Smoking status: Never Smoker  . Smokeless tobacco: Never Used  . Alcohol use No  . Drug use: No  . Sexual activity: Yes   Other Topics Concern  . Not on file   Social History Narrative  . No narrative on file     ROS:  All 11 ROS were addressed and are negative except what is stated in the HPI  PHYSICAL EXAM Vitals:   04/07/16 2319  BP: (!) 146/78  Pulse: 80  Resp: 18  Temp: 98.2 F (36.8 C)   General: Well developed, well nourished, in no acute distress  Head: Eyes PERRLA, No xanthomas.   Normal cephalic and atramatic  Lungs:   Clear bilaterally to auscultation and percussion. Heart:   HRRR S1 S2 Pulses are 2+ & equal.            No carotid bruit. No JVD.  No abdominal bruits. No femoral bruits. Abdomen: Bowel sounds are positive, abdomen soft and non-tender without masses Msk:  Back normal, normal gait. Normal strength and tone for age. Extremities:   No clubbing, cyanosis or edema.  DP +1 Neuro: Alert and oriented X 3. Psych:  Good affect, responds appropriately   Labs:   Lab Results  Component Value Date   WBC 9.3 06/04/2011   HGB 13.6 06/04/2011   HCT 40.7 06/04/2011   MCV 86.4 06/04/2011   PLT 289 06/04/2011   No results for input(s): NA, K, CL, CO2, BUN, CREATININE, CALCIUM, PROT, BILITOT, ALKPHOS, ALT, AST, GLUCOSE in the last 168 hours.  Invalid input(s): LABALBU Lab Results  Component Value Date   CKTOTAL 62 06/04/2011   CKMB 1.9 06/04/2011   TROPONINI <0.30 06/04/2011   No results  found for: PTT Lab Results  Component Value Date   INR 0.98 05/22/2010     Lab Results  Component Value Date   CHOL  12/31/2006    134        ATP III CLASSIFICATION:  <200     mg/dL   Desirable  235-361  mg/dL   Borderline High  >=443    mg/dL   High   Lab Results  Component Value Date   HDL 26 (L) 12/31/2006   Lab Results  Component Value Date   LDLCALC  12/31/2006    79        Total Cholesterol/HDL:CHD Risk Coronary Heart Disease Risk Table                     Men   Women  1/2 Average Risk   3.4   3.3   Lab Results  Component Value Date   TRIG 146 12/31/2006   Lab Results  Component Value Date   CHOLHDL 5.2 12/31/2006   No results found for: LDLDIRECT    Radiology:  No results found.  EKG:  NSR with occasional PVC.  ASSESSMENT/PLAN:   1.  Chest pain with negative cardiac troponin and no ischemia on EKG but evidence of inferior wall ischemia on nuclear stress test and worsening LVF with EF now 29% (was 40% in 2003).  Continue IV Heparin gtt.  Continue ASA. No BB due to transient second degree AV block.  Start high dose statin. NPO after MN.  Plan for cath in am to redefine coronary anatomy. Cardiac catheterization was discussed with the patient fully. The patient understands that risks include but are not limited to stroke (1 in 1000), death (1 in 1000), kidney failure [usually temporary] (1 in 500), bleeding (1 in 200), allergic reaction [possibly serious] (1 in 200).  The patient understands and is willing to proceed.  Check FLP in am.   2.  Transient type I second degree AV block ? Secondary to ischemia.  Continue to monitor on tele.  3.  DCM - EF at time of echo 2003 was 40% and now 20%.  No BB due to transient heart block.  Will add ARB post cath.  Check 2D echo in am.   4.  DM - check HbgA1C in am.  Cover with SS Insulin.  Continue Glipizide but verify with pharmacy  the actual dose. Hold Metformin due to cath.   Armanda Magic, MD  04/07/2016  11:42 PM

## 2016-04-08 ENCOUNTER — Encounter (HOSPITAL_COMMUNITY)
Admission: AD | Disposition: A | Discharge status: Home / Self Care | Payer: Self-pay | Source: Other Acute Inpatient Hospital | Attending: Cardiovascular Disease

## 2016-04-08 DIAGNOSIS — R9439 Abnormal result of other cardiovascular function study: Secondary | ICD-10-CM

## 2016-04-08 DIAGNOSIS — E1149 Type 2 diabetes mellitus with other diabetic neurological complication: Secondary | ICD-10-CM

## 2016-04-08 DIAGNOSIS — I441 Atrioventricular block, second degree: Secondary | ICD-10-CM

## 2016-04-08 DIAGNOSIS — I200 Unstable angina: Principal | ICD-10-CM

## 2016-04-08 HISTORY — PX: CARDIAC CATHETERIZATION: SHX172

## 2016-04-08 LAB — PROTIME-INR
INR: 1.06
INR: 1.08
Prothrombin Time: 13.8 seconds (ref 11.4–15.2)
Prothrombin Time: 14.1 seconds (ref 11.4–15.2)

## 2016-04-08 LAB — COMPREHENSIVE METABOLIC PANEL
ALT: 17 U/L (ref 17–63)
AST: 21 U/L (ref 15–41)
Albumin: 3.9 g/dL (ref 3.5–5.0)
Alkaline Phosphatase: 57 U/L (ref 38–126)
Anion gap: 10 (ref 5–15)
BILIRUBIN TOTAL: 0.4 mg/dL (ref 0.3–1.2)
BUN: 11 mg/dL (ref 6–20)
CALCIUM: 9.2 mg/dL (ref 8.9–10.3)
CO2: 28 mmol/L (ref 22–32)
CREATININE: 1.01 mg/dL (ref 0.61–1.24)
Chloride: 101 mmol/L (ref 101–111)
GFR calc Af Amer: >60 mL/min (ref >60)
Glucose, Bld: 111 mg/dL — ABNORMAL HIGH (ref 65–99)
Potassium: 3.8 mmol/L (ref 3.5–5.1)
Sodium: 139 mmol/L (ref 135–145)
TOTAL PROTEIN: 7.4 g/dL (ref 6.5–8.1)

## 2016-04-08 LAB — CBC WITH DIFFERENTIAL/PLATELET
BASOS ABS: 0 10*3/uL (ref 0.0–0.1)
Basophils Relative: 0 %
Eosinophils Absolute: 0.3 10*3/uL (ref 0.0–0.7)
Eosinophils Relative: 3 %
HEMATOCRIT: 39.5 % (ref 39.0–52.0)
Hemoglobin: 13.1 g/dL (ref 13.0–17.0)
LYMPHS ABS: 3.2 10*3/uL (ref 0.7–4.0)
LYMPHS PCT: 30 %
MCH: 29 pg (ref 26.0–34.0)
MCHC: 33.2 g/dL (ref 30.0–36.0)
MCV: 87.4 fL (ref 78.0–100.0)
MONO ABS: 0.6 10*3/uL (ref 0.1–1.0)
MONOS PCT: 5 %
NEUTROS ABS: 6.6 10*3/uL (ref 1.7–7.7)
Neutrophils Relative %: 62 %
Platelets: 241 10*3/uL (ref 150–400)
RBC: 4.52 MIL/uL (ref 4.22–5.81)
RDW: 13.5 % (ref 11.5–15.5)
WBC: 10.6 10*3/uL — ABNORMAL HIGH (ref 4.0–10.5)

## 2016-04-08 LAB — CBC
HCT: 41.2 % (ref 39.0–52.0)
Hemoglobin: 13.6 g/dL (ref 13.0–17.0)
MCH: 29.2 pg (ref 26.0–34.0)
MCHC: 33 g/dL (ref 30.0–36.0)
MCV: 88.4 fL (ref 78.0–100.0)
PLATELETS: 257 10*3/uL (ref 150–400)
RBC: 4.66 MIL/uL (ref 4.22–5.81)
RDW: 13.5 % (ref 11.5–15.5)
WBC: 10 10*3/uL (ref 4.0–10.5)

## 2016-04-08 LAB — GLUCOSE, CAPILLARY
GLUCOSE-CAPILLARY: 92 mg/dL (ref 65–99)
GLUCOSE-CAPILLARY: 95 mg/dL (ref 65–99)
GLUCOSE-CAPILLARY: 92 mg/dL (ref 65–99)
Glucose-Capillary: 130 mg/dL — ABNORMAL HIGH (ref 65–99)

## 2016-04-08 LAB — LIPID PANEL
CHOL/HDL RATIO: 4.8 ratio
Cholesterol: 153 mg/dL (ref 0–200)
HDL: 32 mg/dL — AB (ref >40)
LDL CALC: 76 mg/dL (ref 0–99)
Triglycerides: 224 mg/dL — ABNORMAL HIGH (ref <150)
VLDL: 45 mg/dL — ABNORMAL HIGH (ref 0–40)

## 2016-04-08 LAB — BASIC METABOLIC PANEL
ANION GAP: 11 (ref 5–15)
BUN: 12 mg/dL (ref 6–20)
CO2: 29 mmol/L (ref 22–32)
Calcium: 9.3 mg/dL (ref 8.9–10.3)
Chloride: 99 mmol/L — ABNORMAL LOW (ref 101–111)
Creatinine, Ser: 1.05 mg/dL (ref 0.61–1.24)
GFR calc Af Amer: >60 mL/min (ref >60)
GFR calc non Af Amer: >60 mL/min (ref >60)
GLUCOSE: 99 mg/dL (ref 65–99)
POTASSIUM: 4.1 mmol/L (ref 3.5–5.1)
Sodium: 139 mmol/L (ref 135–145)

## 2016-04-08 LAB — TROPONIN I
Troponin I: <0.03 ng/mL (ref <0.03)
Troponin I: <0.03 ng/mL (ref <0.03)
Troponin I: <0.03 ng/mL (ref <0.03)

## 2016-04-08 LAB — MAGNESIUM: MAGNESIUM: 1.8 mg/dL (ref 1.7–2.4)

## 2016-04-08 LAB — HEPARIN LEVEL (UNFRACTIONATED)
HEPARIN UNFRACTIONATED: 0.34 [IU]/mL (ref 0.30–0.70)
Heparin Unfractionated: 0.38 IU/mL (ref 0.30–0.70)

## 2016-04-08 LAB — TSH: TSH: 1.883 u[IU]/mL (ref 0.350–4.500)

## 2016-04-08 LAB — APTT: aPTT: 58 seconds — ABNORMAL HIGH (ref 24–36)

## 2016-04-08 SURGERY — LEFT HEART CATH AND CORONARY ANGIOGRAPHY
Anesthesia: LOCAL

## 2016-04-08 MED ORDER — MORPHINE SULFATE ER 30 MG PO TBCR
30.0000 mg | EXTENDED_RELEASE_TABLET | Freq: Two times a day (BID) | ORAL | Status: DC
  Administered 2016-04-08 – 2016-04-09 (×3): 30 mg via ORAL
  Filled 2016-04-08 (×3): qty 1

## 2016-04-08 MED ORDER — HEPARIN SODIUM (PORCINE) 1000 UNIT/ML IJ SOLN
INTRAMUSCULAR | Status: AC
  Filled 2016-04-08: qty 1

## 2016-04-08 MED ORDER — HEPARIN SODIUM (PORCINE) 1000 UNIT/ML IJ SOLN
INTRAMUSCULAR | Status: DC | PRN
  Administered 2016-04-08: 5000 [IU] via INTRAVENOUS

## 2016-04-08 MED ORDER — HEPARIN (PORCINE) IN NACL 2-0.9 UNIT/ML-% IJ SOLN
INTRAMUSCULAR | Status: AC
  Filled 2016-04-08: qty 1500

## 2016-04-08 MED ORDER — SODIUM CHLORIDE 0.9 % WEIGHT BASED INFUSION
1.0000 mL/kg/h | INTRAVENOUS | Status: AC
  Administered 2016-04-08: 1 mL/kg/h via INTRAVENOUS

## 2016-04-08 MED ORDER — LIDOCAINE HCL (PF) 1 % IJ SOLN
INTRAMUSCULAR | Status: AC
  Filled 2016-04-08: qty 30

## 2016-04-08 MED ORDER — FENTANYL CITRATE (PF) 100 MCG/2ML IJ SOLN
INTRAMUSCULAR | Status: AC
  Filled 2016-04-08: qty 2

## 2016-04-08 MED ORDER — SODIUM CHLORIDE 0.9% FLUSH
3.0000 mL | Freq: Two times a day (BID) | INTRAVENOUS | Status: DC
Start: 2016-04-08 — End: 2016-04-09
  Administered 2016-04-09: 3 mL via INTRAVENOUS

## 2016-04-08 MED ORDER — HEPARIN (PORCINE) IN NACL 100-0.45 UNIT/ML-% IJ SOLN
1400.0000 [IU]/h | INTRAMUSCULAR | Status: DC
  Administered 2016-04-08: 1400 [IU]/h via INTRAVENOUS
  Filled 2016-04-08: qty 250

## 2016-04-08 MED ORDER — VERAPAMIL HCL 2.5 MG/ML IV SOLN
INTRAVENOUS | Status: AC
  Filled 2016-04-08: qty 2

## 2016-04-08 MED ORDER — FENTANYL CITRATE (PF) 100 MCG/2ML IJ SOLN
INTRAMUSCULAR | Status: DC | PRN
  Administered 2016-04-08: 25 ug via INTRAVENOUS

## 2016-04-08 MED ORDER — LIDOCAINE HCL (PF) 1 % IJ SOLN
INTRAMUSCULAR | Status: DC | PRN
  Administered 2016-04-08: 2 mL

## 2016-04-08 MED ORDER — SODIUM CHLORIDE 0.9 % IV SOLN: 250.0000 mL | INTRAVENOUS | Status: DC | PRN

## 2016-04-08 MED ORDER — IOPAMIDOL (ISOVUE-370) INJECTION 76%
INTRAVENOUS | Status: DC | PRN
  Administered 2016-04-08: 80 mL via INTRA_ARTERIAL

## 2016-04-08 MED ORDER — HEPARIN (PORCINE) IN NACL 2-0.9 UNIT/ML-% IJ SOLN
INTRAMUSCULAR | Status: DC | PRN
  Administered 2016-04-08: 1500 mL

## 2016-04-08 MED ORDER — VERAPAMIL HCL 2.5 MG/ML IV SOLN
INTRAVENOUS | Status: DC | PRN
  Administered 2016-04-08: 10 mL via INTRA_ARTERIAL

## 2016-04-08 MED ORDER — MIDAZOLAM HCL 2 MG/2ML IJ SOLN
INTRAMUSCULAR | Status: DC | PRN
  Administered 2016-04-08: 2 mg via INTRAVENOUS

## 2016-04-08 MED ORDER — IOPAMIDOL (ISOVUE-370) INJECTION 76%
INTRAVENOUS | Status: AC
  Filled 2016-04-08: qty 100

## 2016-04-08 MED ORDER — MIDAZOLAM HCL 2 MG/2ML IJ SOLN
INTRAMUSCULAR | Status: AC
  Filled 2016-04-08: qty 2

## 2016-04-08 MED ORDER — SODIUM CHLORIDE 0.9% FLUSH: 3.0000 mL | INTRAVENOUS | Status: DC | PRN

## 2016-04-08 MED ORDER — HEPARIN SODIUM (PORCINE) 5000 UNIT/ML IJ SOLN
5000.0000 [IU] | Freq: Three times a day (TID) | INTRAMUSCULAR | Status: DC
  Administered 2016-04-09: 5000 [IU] via SUBCUTANEOUS
  Filled 2016-04-08: qty 1

## 2016-04-08 SURGICAL SUPPLY — 10 items
CATH 5FR JL3.5 JR4 ANG PIG MP (CATHETERS) ×2 IMPLANT
DEVICE RAD COMP TR BAND LRG (VASCULAR PRODUCTS) ×2 IMPLANT
GLIDESHEATH SLEND SS 6F .021 (SHEATH) ×2 IMPLANT
KIT HEART LEFT (KITS) ×2 IMPLANT
PACK CARDIAC CATHETERIZATION (CUSTOM PROCEDURE TRAY) ×2 IMPLANT
SYR MEDRAD MARK V 150ML (SYRINGE) ×2 IMPLANT
TRANSDUCER W/STOPCOCK (MISCELLANEOUS) ×2 IMPLANT
TUBING CIL FLEX 10 FLL-RA (TUBING) ×2 IMPLANT
WIRE HI TORQ VERSACORE-J 145CM (WIRE) ×2 IMPLANT
WIRE SAFE-T 1.5MM-J .035X260CM (WIRE) ×2 IMPLANT

## 2016-04-08 NOTE — Progress Notes (Signed)
Porter Problem List     Active Problems:   Unstable angina Dalton Porter(HCC)    Patient Profile:   Primary Cardiologist: New - Dr. Mayford Knifeurner  60 yo male w/ PMH of NIDDM and chronic back pain who [facility] following an abnormal NST.   Subjective   Having recurrent chest pain this AM radiating to left shoulder. Relieved with SL NTG.  Repeat EKG is without acute ischemic changes.   Inpatient Medications    . [START ON 04/09/2016] aspirin EC  81 mg Oral Daily  . atorvastatin  80 mg Oral q1800  . insulin aspart  0-15 Units Subcutaneous TID WC  . morphine  30 mg Oral BID  . sodium chloride flush  3 mL Intravenous Q12H    Vital Signs    Vitals:   04/08/16 0502 04/08/16 0832 04/08/16 0836 04/08/16 0838  BP: 123/62 139/72 122/66 114/68  Pulse: 70 80 79 81  Resp: 18     Temp: 97.9 F (36.6 C)     TempSrc: Oral     SpO2: 98%     Weight:      Height:        Intake/Output Summary (Last 24 hours) at 04/08/16 0851 Last data filed at 04/08/16 0500  Gross per 24 hour  Intake                0 ml  Output              200 ml  Net             -200 ml   Filed Weights   04/07/16 2319  Weight: 216 lb (98 kg)    Physical Exam    General: Well developed, well nourished, male appearing in no acute distress. Head: Normocephalic, atraumatic.  Neck: Supple without bruits, JVD not elevated. Lungs:  Resp regular and unlabored, CTA without wheezing or rales. Heart: RRR, S1, S2, no S3, S4, or murmur; no rub. Abdomen: Soft, non-tender, non-distended with normoactive bowel sounds. No hepatomegaly. No rebound/guarding. No obvious abdominal masses. Extremities: No clubbing, cyanosis, or edema. Distal pedal pulses are 2+ bilaterally. Neuro: Alert and oriented X 3. Moves all extremities spontaneously. Psych: Normal affect.  Labs    CBC  Recent Labs  04/08/16 0006 04/08/16 0544  WBC 10.6* 10.0  NEUTROABS 6.6  --   HGB 13.1 13.6  HCT  39.5 41.2  MCV 87.4 88.4  PLT 241 257   Basic Metabolic Panel  Recent Labs  04/08/16 0006 04/08/16 0544  NA 139 139  K 3.8 4.1  CL 101 99*  CO2 28 29  GLUCOSE 111* 99  BUN 11 12  CREATININE 1.01 1.05  CALCIUM 9.2 9.3  MG 1.8  --    Liver Function Tests  Recent Labs  04/08/16 0006  AST 21  ALT 17  ALKPHOS 57  BILITOT 0.4  PROT 7.4  ALBUMIN 3.9   No results for input(s): LIPASE, AMYLASE in the last 72 hours. Cardiac Enzymes  Recent Labs  04/08/16 0006 04/08/16 0544  TROPONINI <0.03 <0.03   BNP Invalid input(s): POCBNP D-Dimer No results for input(s): DDIMER in the last 72 hours. Hemoglobin A1C No results for input(s): HGBA1C in the last 72 hours. Fasting Lipid Panel  Recent Labs  04/08/16 0544  CHOL 153  HDL 32*  LDLCALC 76  TRIG [id number]*  CHOLHDL 4.8   Thyroid Function Tests  Recent Labs  04/08/16  0007  TSH 1.883    Telemetry    NSR, HR in 60's - 70's. Episodes of Second Degree Type 1 AV Block.  ECG    NSR, HR 69 with 1st degree AV block.    Cardiac Studies and Radiology    Echocardiogram: Pending  Assessment & Plan    1.  Chest pain concerning for Unstable Angina - negative cardiac troponin and no ischemia on EKG but evidence of inferior wall ischemia on nuclear stress test and worsening LVF with EF now 29% (was 40% in 2003).   - started on Heparin drip. Continue ASA and newly started high-intensity statin. No BB due to transient second degree AV block.   - for cardiac catheterization later this afternoon. Creatinine stable at 1.05. Cardiac catheterization was discussed with the patient fully. The patient understands that risks include but are not limited to stroke (1 in 1000), death (1 in 1000), kidney failure [usually temporary] (1 in 500), bleeding (1 in 200), allergic reaction [possibly serious] (1 in 200).  The patient understands and is willing to proceed.   2. Transient type I second degree AV block - possibly secondary to  ischemia. EKG this AM shows Type 1.  - monitor on telemetry.   3. DCM - EF at time of echo 2003 was 40% and now 20%.  No BB due to transient heart block.  Will add ARB post cath.   - repeat echocardiogram is pending.   4. Type 2 DM - HbgA1C is pending . Oral medications held in setting of upcoming cardiac catheterization.  - SSI while admitted.   Signed, Ellsworth Lennox , PA-C 8:51 AM 04/08/2016 Pager: [phone]

## 2016-04-08 NOTE — Progress Notes (Addendum)
ANTICOAGULATION CONSULT NOTE - Initial Consult  Pharmacy Consult for Heparin Indication: chest pain/ACS  Allergies  Allergen Reactions  . Codeine   . Peanuts [Nuts]     Patient Measurements: Height: 5\' 11"  (180.3 cm) Weight: 216 lb (98 kg) IBW/kg (Calculated) : 75.3 Heparin Dosing Weight: 98 kg  Vital Signs: Temp: 98.2 F (36.8 C) (10/09 2319) Temp Source: Oral (10/09 2319) BP: 146/78 (10/09 2319) Pulse Rate: 80 (10/09 2319)  Labs: No results for input(s): HGB, HCT, PLT, APTT, LABPROT, INR, HEPARINUNFRC, HEPRLOWMOCWT, CREATININE, CKTOTAL, CKMB, TROPONINI in the last 72 hours.  CrCl cannot be calculated (Patient's most recent lab result is older than the maximum 21 days allowed.).   Medical History: Past Medical History:  Diagnosis Date  . Back pain, chronic   . Diabetes mellitus     Medications:  Awaiting electronic med rec  Assessment: 60 y.o. M presented to [facility] with CP. Heparin started at [facility] with 4000 unit bolus and gtt running at 1320 units/hr - appears to have been started this afternoon - [facility] records somewhat unclear. Nuclear stress test nuclear stress test today showing a large area of reversibility in the inferior wall with severe diffuse hypokinesis and EF 29%. Transferred to [facility] for further w/u. Plan for cath today.  Labs from [place]:  SCr 0.96, Hgb 13, Hct 39.8, Plt 250  Goal of Therapy:  Heparin level 0.3-0.7 units/ml Monitor platelets by anticoagulation protocol: Yes   Plan:  Change heparin to our concentration at 1400 units/hr (almost same as [facility] rate) Stat heparin level Daily heparin level and CBC  Christoper Fabian, PharmD, BCPS Clinical pharmacist, pager [phone] 04/08/2016,12:04 AM   Addendum: Heparin level therapeutic (0.38) on gtt at 1320 units/hr from [place].  Plan: Continue orders as above Will f/u confirmatory heparin level in 6 hours or f/u post cath  Christoper Fabian, PharmD, BCPS Clinical pharmacist, pager  [phone] 04/08/2016 12:53 AM

## 2016-04-08 NOTE — H&P (View-Only) (Signed)
  Hospital Problem List     Active Problems:   Unstable angina (HCC)    Patient Profile:   Primary Cardiologist: New - Dr. Turner  60 yo male w/ PMH of NIDDM and chronic back pain who presented [facility]07[facility] as a transfer from [facility] following an abnormal NST.   Subjective   Having recurrent chest pain this AM radiating to left shoulder. Relieved with SL NTG.  Repeat EKG is without acute ischemic changes.   Inpatient Medications    . [START ON 04/09/2016] aspirin EC  81 mg Oral Daily  . atorvastatin  80 mg Oral q1800  . insulin aspart  0-15 Units Subcutaneous TID WC  . morphine  30 mg Oral BID  . sodium chloride flush  3 mL Intravenous Q12H    Vital Signs    Vitals:   04/08/16 0502 04/08/16 0832 04/08/16 0836 04/08/16 0838  BP: 123/62 139/72 122/66 114/68  Pulse: 70 80 79 81  Resp: 18     Temp: 97.9 F (36.6 C)     TempSrc: Oral     SpO2: 98%     Weight:      Height:        Intake/Output Summary (Last 24 hours) at 04/08/16 0851 Last data filed at 04/08/16 0500  Gross per 24 hour  Intake                0 ml  Output              200 ml  Net             -200 ml   Filed Weights   04/07/16 2319  Weight: 216 lb (98 kg)    Physical Exam    General: Well developed, well nourished, male appearing in no acute distress. Head: Normocephalic, atraumatic.  Neck: Supple without bruits, JVD not elevated. Lungs:  Resp regular and unlabored, CTA without wheezing or rales. Heart: RRR, S1, S2, no S3, S4, or murmur; no rub. Abdomen: Soft, non-tender, non-distended with normoactive bowel sounds. No hepatomegaly. No rebound/guarding. No obvious abdominal masses. Extremities: No clubbing, cyanosis, or edema. Distal pedal pulses are 2+ bilaterally. Neuro: Alert and oriented X 3. Moves all extremities spontaneously. Psych: Normal affect.  Labs    CBC  Recent Labs  04/08/16 0006 04/08/16 0544  WBC 10.6* 10.0  NEUTROABS 6.6  --   HGB 13.1 13.6  HCT  39.5 41.2  MCV 87.4 88.4  PLT 241 257   Basic Metabolic Panel  Recent Labs  04/08/16 0006 04/08/16 0544  NA 139 139  K 3.8 4.1  CL 101 99*  CO2 28 29  GLUCOSE 111* 99  BUN 11 12  CREATININE 1.01 1.05  CALCIUM 9.2 9.3  MG 1.8  --    Liver Function Tests  Recent Labs  04/08/16 0006  AST 21  ALT 17  ALKPHOS 57  BILITOT 0.4  PROT 7.4  ALBUMIN 3.9   No results for input(s): LIPASE, AMYLASE in the last 72 hours. Cardiac Enzymes  Recent Labs  04/08/16 0006 04/08/16 0544  TROPONINI <0.03 <0.03   BNP Invalid input(s): POCBNP D-Dimer No results for input(s): DDIMER in the last 72 hours. Hemoglobin A1C No results for input(s): HGBA1C in the last 72 hours. Fasting Lipid Panel  Recent Labs  04/08/16 0544  CHOL 153  HDL 32*  LDLCALC 76  TRIG 224*  CHOLHDL 4.8   Thyroid Function Tests  Recent Labs  04/08/16   0007  TSH 1.883    Telemetry    NSR, HR in 60's - 70's. Episodes of Second Degree Type 1 AV Block.  ECG    NSR, HR 69 with 1st degree AV block.    Cardiac Studies and Radiology    Echocardiogram: Pending  Assessment & Plan    1.  Chest pain concerning for Unstable Angina - negative cardiac troponin and no ischemia on EKG but evidence of inferior wall ischemia on nuclear stress test and worsening LVF with EF now 29% (was 40% in 2003).   - started on Heparin drip. Continue ASA and newly started high-intensity statin. No BB due to transient second degree AV block.   - for cardiac catheterization later this afternoon. Creatinine stable at 1.05. Cardiac catheterization was discussed with the patient fully. The patient understands that risks include but are not limited to stroke (1 in 1000), death (1 in 1000), kidney failure [usually temporary] (1 in 500), bleeding (1 in 200), allergic reaction [possibly serious] (1 in 200).  The patient understands and is willing to proceed.   2. Transient type I second degree AV block - possibly secondary to  ischemia. EKG this AM shows Type 1.  - monitor on telemetry.   3. DCM - EF at time of echo 2003 was 40% and now 20%.  No BB due to transient heart block.  Will add ARB post cath.   - repeat echocardiogram is pending.   4. Type 2 DM - HbgA1C is pending . Oral medications held in setting of upcoming cardiac catheterization.  - SSI while admitted.   Signed, Ellsworth Lennox , PA-C 8:51 AM 04/08/2016 Pager: [phone]

## 2016-04-08 NOTE — Progress Notes (Signed)
Per protocol called cath lab regarding additional assistance for pt TR band that has remained level 1, but continues to bleed when air is taken out. Cath lab RN to bedside to evaluate TR band. Will continue to monitor.

## 2016-04-08 NOTE — Interval H&P Note (Signed)
History and Physical Interval Note:  04/08/2016 2:39 PM  Dalton Porter  has presented today for surgery, with the diagnosis of unstable angina  The various methods of treatment have been discussed with the patient and family. After consideration of risks, benefits and other options for treatment, the patient has consented to  Procedure(s): Left Heart Cath and Coronary Angiography (N/A) as a surgical intervention .  The patient's history has been reviewed, patient examined, no change in status, stable for surgery.  I have reviewed the patient's chart and labs.  Questions were answered to the patient's satisfaction.     Cherylann Ratel Lab Visit (complete for each Cath Lab visit)  Clinical Evaluation Leading to the Procedure:   ACS: Yes.    Non-ACS:    Anginal Classification: CCS III  Anti-ischemic medical therapy: No Therapy  Non-Invasive Test Results: High-risk stress test findings: cardiac mortality >3%/year  Prior CABG: No previous CABG       04/08/2016 2:39 PM

## 2016-04-08 NOTE — Research (Signed)
[place] Study Informed Consent   Subject Name: Dalton Porter  Subject met inclusion and exclusion criteria.  The informed consent form, study requirements and expectations were reviewed with the subject and questions and concerns were addressed prior to the signing of the consent form.  The subject verbalized understanding of the trial requirements.  The subject agreed to participate in the trial and signed the informed consent.  The informed consent was obtained prior to performance of any protocol-specific procedures for the subject.  A copy of the signed informed consent was given to the subject and a copy was placed in the subject's medical record.  Blossom Hoops 04/08/2016, 9:21 AM

## 2016-04-08 NOTE — Progress Notes (Signed)
ANTICOAGULATION CONSULT NOTE - Follow Up Consult  Pharmacy Consult for heparin Indication: chest pain/ACS  Allergies  Allergen Reactions  . Codeine   . Peanuts [Nuts]     Patient Measurements: Height: 5\' 11"  (180.3 cm) Weight: 216 lb (98 kg) IBW/kg (Calculated) : 75.3 Heparin Dosing Weight: 98 kg  Vital Signs: Temp: 97.9 F (36.6 C) (10/10 0502) Temp Source: Oral (10/10 0502) BP: 114/68 (10/10 0838) Pulse Rate: 81 (10/10 0838)  Labs:  Recent Labs  04/08/16 0006 04/08/16 0544  HGB 13.1 13.6  HCT 39.5 41.2  PLT 241 257  APTT 58*  --   LABPROT 14.1 13.8  INR 1.08 1.06  HEPARINUNFRC 0.38 0.34  CREATININE 1.01 1.05  TROPONINI <0.03 <0.03    Estimated Creatinine Clearance: 91.5 mL/min (by C-G formula based on SCr of 1.05 mg/dL).   Medications:  Infusions:  . sodium chloride 1 mL/kg/hr (04/08/16 0700)  . heparin 1,400 Units/hr (04/08/16 0026)    Assessment: 60 y.o. M presented to [facility] with CP. Heparin started at [facility] with 4000 unit bolus and gtt running at 1320 units/hr - appears to have been started yesterday afternoon - [facility] records somewhat unclear. Nuclear stress test nuclear stress test showing a large area of reversibility in the inferior wall with severe diffuse hypokinesis and EF 29%. Transferred to [facility] for further w/u. Plan for cath today.  Heparin level is therapeutic at 0.34. No bleeding noted, CBC is normal.  Goal of Therapy:  Heparin level 0.3-0.7 units/ml Monitor platelets by anticoagulation protocol: Yes   Plan:  Continue heparin drip at 1400 units/hr Daily heparin level and CBC Monitor for s/sx of bleeding F/u after cath today  Loura Back, PharmD, BCPS Clinical Pharmacist Phone for today [phone] Main pharmacy - [phone] 04/08/2016 10:04 AM

## 2016-04-08 NOTE — Progress Notes (Signed)
0830 Pt calls complaining chest pain 3/10 localized to L shoulder describing pain as "pressure that does not radiate anywhere else". Pt also complaining of nausea. Vitals taken. EKG done. Nitro sublingual given. Chest pain relieved to 1/10. Randall An PA notified in person 33. Will continue to monitor pt.

## 2016-04-09 ENCOUNTER — Inpatient Hospital Stay (HOSPITAL_COMMUNITY): Payer: Medicare Other

## 2016-04-09 ENCOUNTER — Other Ambulatory Visit: Payer: Self-pay | Admitting: Student

## 2016-04-09 ENCOUNTER — Telehealth: Payer: Self-pay | Admitting: *Deleted

## 2016-04-09 ENCOUNTER — Encounter (HOSPITAL_COMMUNITY): Payer: Self-pay | Admitting: Cardiology

## 2016-04-09 ENCOUNTER — Other Ambulatory Visit: Payer: Self-pay | Admitting: Cardiology

## 2016-04-09 DIAGNOSIS — R079 Chest pain, unspecified: Secondary | ICD-10-CM

## 2016-04-09 DIAGNOSIS — I428 Other cardiomyopathies: Secondary | ICD-10-CM

## 2016-04-09 DIAGNOSIS — I441 Atrioventricular block, second degree: Secondary | ICD-10-CM

## 2016-04-09 DIAGNOSIS — I420 Dilated cardiomyopathy: Secondary | ICD-10-CM

## 2016-04-09 LAB — ECHOCARDIOGRAM COMPLETE
HEIGHTINCHES: 71 in
Weight: 3452.8 oz

## 2016-04-09 LAB — HEMOGLOBIN A1C
HEMOGLOBIN A1C: 5.5 % (ref 4.8–5.6)
MEAN PLASMA GLUCOSE: 111 mg/dL

## 2016-04-09 LAB — GLUCOSE, CAPILLARY
GLUCOSE-CAPILLARY: 189 mg/dL — AB (ref 65–99)
Glucose-Capillary: 119 mg/dL — ABNORMAL HIGH (ref 65–99)

## 2016-04-09 LAB — FERRITIN: Ferritin: 53 ng/mL (ref 24–336)

## 2016-04-09 MED ORDER — LOSARTAN POTASSIUM 25 MG PO TABS: 12.5000 mg | ORAL_TABLET | Freq: Every day | ORAL | 6 refills | Status: DC

## 2016-04-09 MED ORDER — ROSUVASTATIN CALCIUM 20 MG PO TABS: 20.0000 mg | ORAL_TABLET | Freq: Every day | ORAL | 6 refills | Status: DC

## 2016-04-09 MED ORDER — LOSARTAN POTASSIUM 25 MG PO TABS
12.5000 mg | ORAL_TABLET | Freq: Every day | ORAL | Status: DC
  Administered 2016-04-09: 12.5 mg via ORAL
  Filled 2016-04-09: qty 1

## 2016-04-09 MED ORDER — ROSUVASTATIN CALCIUM 20 MG PO TABS: 20.0000 mg | ORAL_TABLET | Freq: Every day | ORAL | Status: DC

## 2016-04-09 NOTE — Progress Notes (Signed)
Pt has orders to be discharged. Discharge instructions given and pt has no additional questions at this time. Medication regimen reviewed and pt educated. Pt verbalized understanding and has no additional questions. Telemetry box removed. IV removed and site in good condition. Pt stable and waiting for transportation.   Isac Lincks RN 

## 2016-04-09 NOTE — Discharge Summary (Addendum)
Discharge Summary    Patient ID: Dalton Porter,  MRN: [id number], DOB/AGE: 07-Jun-1956 60 y.o.  Admit date: 04/07/2016 Discharge date: 04/09/2016  Primary Care Provider: No primary care provider on file. Primary Cardiologist: Wishes to follow-up in [place], [place]  Discharge Diagnoses    Principal Problem:   Chest pain on exertion Active Problems:   Diabetes mellitus type 2 with neurological manifestations (HCC)   Nonischemic cardiomyopathy (HCC)   Chronic combined systolic/diastolic CHF   History of Present Illness     Dalton Porter is a 60 y.o. male with past medical history of NIDDM, chronic back pain, and dilated cardiomyopathy (EF 40% by echo in 2003, normal cath per patient in 2014) who presented to [facility] on 04/07/2016 as a transfer from [facility].   He reported having chest discomfort starting at 0500 the previous morning which he described as an "ice-pick" in his chest which radiated to his neck and back. No associated nausea, vomiting, diaphoresis, or dyspnea.   He went to [facility] for evaluation with his initial troponin being negative and EKG showing no acute ischemic changes. Telemetry did show intermittent Wenkebach heart block. He underwent nuclear stress testing which showed a large area of reversibility in the inferior wall with severe diffuse hypokinesis and EF 29%, therefore being transferred to [facility] for further evaluation.    Hospital Course     Consultants: None  He denied any pain upon arrival but had recurrent pain the following morning which was relieved with SL NTG. A cardiac catheterization was recommended for definitive evaluation in the setting of his recurrent symptoms and abnormal NST and he agreed to proceed.   Cardiac cath showed severe LV dysfunction with EF of 25-35% with normal cors, consistent with nonischemic cardiomyopathy. A repeat echocardiogram was obtained the following morning which showed an EF of 40% to 45% with  diffuse hypokinesis and Grade 1 DD.  He has chronic combined systolic/diastolic CHF and appears euvolemic on exam despite mildly elevated LVEDP at time of cath.  BP too soft for addition of diuretic at this time.  He was started on a low-dose ARB. BB therapy was avoided with his transient 2nd degree Type 1 AV block.  He was last examined by Dr. Mayford Knife and deemed stable for discharge. Ferritin, HIV Antibody, and Protein Electrophoresis were drawn prior to discharge and will need to be reviewed at follow-up. Dr. Mayford Knife recommended a cardiac MRI (has been ordered and in work queue) and a 30-day event monitor (arranged by [employer]). He was discharged home in good condition and has Cardiology hospital follow-up on 04/22/2016 with Dr. Wyline Mood in [place], [place].  _____________  Discharge Vitals Blood pressure (!) 101/56, pulse 70, temperature 97.9 F (36.6 C), temperature source Oral, resp. rate 18, height 5\' 11"  (1.803 m), weight 215 lb 12.8 oz (97.9 kg), SpO2 96 %.  Filed Weights   04/07/16 2319 04/09/16 0340  Weight: 216 lb (98 kg) 215 lb 12.8 oz (97.9 kg)    Labs & Radiologic Studies     CBC  Recent Labs  04/08/16 0006 04/08/16 0544  WBC 10.6* 10.0  NEUTROABS 6.6  --   HGB 13.1 13.6  HCT 39.5 41.2  MCV 87.4 88.4  PLT 241 257   Basic Metabolic Panel  Recent Labs  04/08/16 0006 04/08/16 0544  NA 139 139  K 3.8 4.1  CL 101 99*  CO2 28 29  GLUCOSE 111* 99  BUN 11 12  CREATININE  1.01 1.05  CALCIUM 9.2 9.3  MG 1.8  --    Liver Function Tests  Recent Labs  04/08/16 0006  AST 21  ALT 17  ALKPHOS 57  BILITOT 0.4  PROT 7.4  ALBUMIN 3.9   No results for input(s): LIPASE, AMYLASE in the last 72 hours. Cardiac Enzymes  Recent Labs  04/08/16 0006 04/08/16 0544 04/08/16 1313  TROPONINI <0.03 <0.03 <0.03   BNP Invalid input(s): POCBNP D-Dimer No results for input(s): DDIMER in the last 72 hours. Hemoglobin A1C  Recent Labs  04/08/16 0007  HGBA1C 5.5   Fasting  Lipid Panel  Recent Labs  04/08/16 0544  CHOL 153  HDL 32*  LDLCALC 76  TRIG [id number]*  CHOLHDL 4.8   Thyroid Function Tests  Recent Labs  04/08/16 0007  TSH 1.883    No results found.   Diagnostic Studies/Procedures    Echocardiogram: 04/09/2016 Study Conclusions  - Left ventricle: The cavity size was normal. There was mild focal basal hypertrophy of the septum. Systolic function was mildly to moderately reduced. The estimated ejection fraction was in the range of 40% to 45%. Diffuse hypokinesis. Doppler parameters are consistent with abnormal left ventricular relaxation (grade 1 diastolic dysfunction). - Mitral valve: There was mild regurgitation. - Left atrium: The atrium was mildly dilated. - Pulmonary arteries: Systolic pressure was mildly increased.   Cardiac Catheterization: 04/08/2016  There is severe left ventricular systolic dysfunction.  LV end diastolic pressure is mildly elevated.  The left ventricular ejection fraction is 25-35% by visual estimate.  1. Normal coronary anatomy 2. Severe LV dysfunction- global 3. Mildly elevated LVEDP  Plan: optimize medical therapy for CHF.  Disposition   Pt is being discharged home today in good condition.  Follow-up Plans & Appointments    Follow-up Information    Dina RichBranch, Jonathan, MD .   Specialty:  Cardiology Why:  Cardiology Hospital Follow-up on 04/22/2016 at 11:40AM. Contact information: [address] [place] [address] [phone]          Discharge Instructions    Diet - low sodium heart healthy    Complete by:  As directed    Discharge instructions    Complete by:  As directed    PLEASE REMEMBER TO BRING ALL OF YOUR MEDICATIONS TO EACH OF YOUR FOLLOW-UP OFFICE VISITS.  PLEASE ATTEND ALL SCHEDULED FOLLOW-UP APPOINTMENTS.   Activity: Increase activity slowly as tolerated. You may shower, but no soaking baths (or swimming) for 1 week. No driving for 24 hours.  No lifting over 5 lbs for 1 week. No sexual activity for 1 week.   You May Return to Work: in 1 week (if applicable)  Wound Care: You may wash cath site gently with soap and water. Keep cath site clean and dry. If you notice pain, swelling, bleeding or pus at your cath site, please call [phone].   Increase activity slowly    Complete by:  As directed       Discharge Medications     Medication List    TAKE these medications   glipiZIDE 5 MG tablet Commonly known as:  GLUCOTROL Take 5 mg by mouth 2 (two) times daily before a meal.   losartan 25 MG tablet Commonly known as:  COZAAR Take 0.5 tablets (12.5 mg total) by mouth daily. Start taking on:  04/10/2016   metFORMIN 500 MG tablet Commonly known as:  GLUCOPHAGE Take 500 mg by mouth 2 (two) times daily with a meal. Notes to patient:  RESUME ON 04/11/2016   morphine 30 MG 24 hr capsule Commonly known as:  KADIAN Take 30 mg by mouth 2 (two) times daily.   rosuvastatin 20 MG tablet Commonly known as:  CRESTOR Take 1 tablet (20 mg total) by mouth daily at 6 PM.       Allergies Allergies  Allergen Reactions  . 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 Codeine Rash    headache     Outstanding Labs/Studies   Lipid Panel and LFT's in 6 weeks. Cardiac MRI.  Duration of Discharge Encounter   Greater than 30 minutes including physician time.  Signed, Ellsworth Lennox, PA-C 04/09/2016, 1:00 PM

## 2016-04-09 NOTE — Telephone Encounter (Signed)
Pt being d/c'd today and per [place], PA - 30 day event monitor for pt enrolled on [employer] - monitor will be mailed to pt - appt with Dr. Wyline Mood scheduled 04/22/16 [place] office.

## 2016-04-09 NOTE — Progress Notes (Signed)
  Echocardiogram 2D Echocardiogram has been performed.  Arvil Chaco 04/09/2016, 8:52 AM

## 2016-04-09 NOTE — Progress Notes (Addendum)
Hospital Problem List     Principal Problem:   Chest pain on exertion Active Problems:   Diabetes mellitus type 2 with neurological manifestations (HCC)   Nonischemic cardiomyopathy [facility])    Patient Profile:   [facility] Cardiologist: [facility]  60 yo male w/ PMH of NIDDM and chronic back pain who presented to [facility] on 04/07/2016 as a transfer from [facility] following an abnormal NST.   Subjective   No recurrent chest pain. No palpitations or dyspnea.   Inpatient Medications    . aspirin EC  81 mg Oral Daily  . atorvastatin  80 mg Oral q1800  . heparin  5,000 Units Subcutaneous Q8H  . insulin aspart  0-15 Units Subcutaneous TID WC  . morphine  30 mg Oral BID  . sodium chloride flush  3 mL Intravenous Q12H    Vital Signs    Vitals:   04/08/16 1730 04/08/16 2010 04/09/16 0000 04/09/16 0340  BP: 117/65 120/68 (!) 106/56 (!) 101/56  Pulse: 82 82 80 70  Resp:  18    Temp:  98.4 F (36.9 C) 97.8 F (36.6 C) 97.9 F (36.6 C)  TempSrc:  Oral Oral Oral  SpO2:  96% 97% 96%  Weight:    215 lb 12.8 oz (97.9 kg)  Height:        Intake/Output Summary (Last 24 hours) at 04/09/16 1004 Last data filed at 04/09/16 0956  Gross per 24 hour  Intake              769 ml  Output             1970 ml  Net            -1201 ml   Filed Weights   04/07/16 2319 04/09/16 0340  Weight: 216 lb (98 kg) 215 lb 12.8 oz (97.9 kg)    Physical Exam    General: Well developed, well nourished, male appearing in no acute distress. Head: Normocephalic, atraumatic.  Neck: Supple without bruits, JVD not elevated. Lungs:  Resp regular and unlabored, CTA without wheezing or rales. Heart: RRR, S1, S2, no S3, S4, or murmur; no rub. Abdomen: Soft, non-tender, non-distended with normoactive bowel sounds. No hepatomegaly. No rebound/guarding. No obvious abdominal masses. Extremities: No clubbing, cyanosis, or edema. Distal pedal pulses are 2+ bilaterally. Neuro: Alert and oriented X 3. Moves all  extremities spontaneously. Psych: Normal affect.  Labs    CBC  Recent Labs  04/08/16 0006 04/08/16 0544  WBC 10.6* 10.0  NEUTROABS 6.6  --   HGB 13.1 13.6  HCT 39.5 41.2  MCV 87.4 88.4  PLT 241 257   Basic Metabolic Panel  Recent Labs  04/08/16 0006 04/08/16 0544  NA 139 139  K 3.8 4.1  CL 101 99*  CO2 28 29  GLUCOSE 111* 99  BUN 11 12  CREATININE 1.01 1.05  CALCIUM 9.2 9.3  MG 1.8  --    Liver Function Tests  Recent Labs  04/08/16 0006  AST 21  ALT 17  ALKPHOS 57  BILITOT 0.4  PROT 7.4  ALBUMIN 3.9   No results for input(s): LIPASE, AMYLASE in the last 72 hours. Cardiac Enzymes  Recent Labs  04/08/16 0006 04/08/16 0544 04/08/16 1313  TROPONINI <0.03 <0.03 <0.03   BNP Invalid input(s): POCBNP D-Dimer No results for input(s): DDIMER in the last 72 hours. Hemoglobin A1C  Recent Labs  04/08/16 0007  HGBA1C 5.5   Fasting Lipid Panel  Recent Labs  04/08/16 0544  CHOL 153  HDL 32*  LDLCALC 76  TRIG [id number]*  CHOLHDL 4.8   Thyroid Function Tests  Recent Labs  04/08/16 0007  TSH 1.883    Telemetry    NSR, HR in 60's - 70's. Occasional PAC's and PVC's.   ECG    NSR, HR 69 with 1st degree AV block.    Cardiac Studies and Radiology    Echocardiogram: 04/09/2016 Study Conclusions  - Left ventricle: The cavity size was normal. There was mild focal   basal hypertrophy of the septum. Systolic function was mildly to   moderately reduced. The estimated ejection fraction was in the   range of 40% to 45%. Diffuse hypokinesis. Doppler parameters are   consistent with abnormal left ventricular relaxation (grade 1   diastolic dysfunction). - Mitral valve: There was mild regurgitation. - Left atrium: The atrium was mildly dilated. - Pulmonary arteries: Systolic pressure was mildly increased.  Cardiac Catheterization: 04/08/2016  There is severe left ventricular systolic dysfunction.  LV end diastolic pressure is mildly  elevated.  The left ventricular ejection fraction is 25-35% by visual estimate.   1. Normal coronary anatomy 2. Severe LV dysfunction- global 3. Mildly elevated LVEDP  Plan: optimize medical therapy for CHF.  Assessment & Plan    1.  Chest pain concerning for Unstable Angina - negative cardiac troponin and no ischemia on EKG but evidence of inferior wall ischemia on nuclear stress test and worsening LVF with EF now 29% (was 40% in 2003).   - cardiac cath showed severe LV dysfunction with EF of 25-35%. Normal cors by cath, consistent with nonischemic cardiomyopathy.  - denies any recurrent chest discomfort. Likely of non-cardiac etiology.  2. Transient type I second degree AV block - transient episodes on telemetry.   3. DCM with chronic combined systolic/diastolic  CHF - patient appears euvolemic on exam despite mildly elevated LVEDP at time of cath.   - EF at time of echo in 2003 was 40% and now 20%. Echo this admission shows EF of 40% to 45% with diffuse hypokinesis and Grade 1 DD. - No BB due to transient heart block. Will start low-dose ARB. Can titrate as outpatient if BP allows. BP too soft for diuretic at this time.  4. Type 2 DM - HbgA1C at 5.5 this admission. Oral medications held until 48 hours following cath  - SSI while admitted.   Signed, Ellsworth LennoxBrittany M Strader , PA-C 10:04 AM 04/09/2016 Pager: [phone]

## 2016-04-10 LAB — HIV ANTIBODY (ROUTINE TESTING W REFLEX): HIV Screen 4th Generation wRfx: NONREACTIVE

## 2016-04-10 LAB — PROTEIN ELECTROPHORESIS, SERUM
A/G Ratio: 1 (ref 0.7–1.7)
ALBUMIN ELP: 3.6 g/dL (ref 2.9–4.4)
ALPHA-1-GLOBULIN: 0.3 g/dL (ref 0.0–0.4)
Alpha-2-Globulin: 1.2 g/dL — ABNORMAL HIGH (ref 0.4–1.0)
BETA GLOBULIN: 1.1 g/dL (ref 0.7–1.3)
GAMMA GLOBULIN: 1 g/dL (ref 0.4–1.8)
Globulin, Total: 3.6 g/dL (ref 2.2–3.9)
TOTAL PROTEIN ELP: 7.2 g/dL (ref 6.0–8.5)

## 2016-04-11 ENCOUNTER — Encounter: Payer: Self-pay | Admitting: Cardiology

## 2016-04-11 ENCOUNTER — Telehealth: Payer: Self-pay | Admitting: Cardiology

## 2016-04-11 ENCOUNTER — Encounter: Payer: Self-pay | Admitting: *Deleted

## 2016-04-11 NOTE — Telephone Encounter (Signed)
New message ° ° ° ° °Returning a call to the nurse °

## 2016-04-11 NOTE — Telephone Encounter (Signed)
Called patient at home number but was not the name of the patient.  Called cell phone number and the number was no longer in service.  Mailing letter to patient regarding cardiac MRI.

## 2016-04-11 NOTE — Telephone Encounter (Signed)
This encounter was created in error - please disregard.

## 2016-04-16 NOTE — Telephone Encounter (Signed)
Pt called says he received text message from [employer] that event monitor had been delivered but pt had not received it - verified address and pt says it was incorrect address -[employer] says pt verified address on 10/11 - update address and also [employer] will send out another monitor to updated address

## 2016-04-17 ENCOUNTER — Other Ambulatory Visit: Payer: Self-pay | Admitting: *Deleted

## 2016-04-17 ENCOUNTER — Encounter (INDEPENDENT_AMBULATORY_CARE_PROVIDER_SITE_OTHER): Payer: Medicare Other

## 2016-04-17 DIAGNOSIS — I459 Conduction disorder, unspecified: Secondary | ICD-10-CM | POA: Diagnosis not present

## 2016-04-17 DIAGNOSIS — I441 Atrioventricular block, second degree: Secondary | ICD-10-CM

## 2016-04-18 ENCOUNTER — Other Ambulatory Visit: Payer: Self-pay | Admitting: *Deleted

## 2016-04-18 DIAGNOSIS — I459 Conduction disorder, unspecified: Secondary | ICD-10-CM

## 2016-04-22 ENCOUNTER — Encounter: Payer: Self-pay | Admitting: Cardiology

## 2016-04-22 ENCOUNTER — Ambulatory Visit (INDEPENDENT_AMBULATORY_CARE_PROVIDER_SITE_OTHER): Payer: Medicare Other | Admitting: Cardiology

## 2016-04-22 VITALS — BP 115/72 | HR 83 | Ht 71.0 in | Wt 228.2 lb

## 2016-04-22 DIAGNOSIS — I5022 Chronic systolic (congestive) heart failure: Secondary | ICD-10-CM | POA: Diagnosis not present

## 2016-04-22 DIAGNOSIS — I441 Atrioventricular block, second degree: Secondary | ICD-10-CM | POA: Diagnosis not present

## 2016-04-22 DIAGNOSIS — R0789 Other chest pain: Secondary | ICD-10-CM

## 2016-04-22 NOTE — Progress Notes (Signed)
Clinical Summary Dalton Porter is a 60 y.o.male seen for hospital follow up, this is our first visit together.  1. Chest pain - recent admit 03/2016 with chest pain.  - cath 03/2016 with normal coronaries.  - 03/2016 echo LVEF 40-45%  - no recurrent chest pain. No SOB or DOE - no LE edema.  2. Chronic systoilc HF - echo 03/2016 LVEF 40-45%, recent cath patent coronaries - medical therapy limited by soft bp's and Wenchebach heart block - from discharge notes concern about possible infiltratrive process, a cardiac MRI which is pending.  -no recent SOB/DOE/LE edema  3. Heart block - second degree type I heart block noted during recent admission - patient denies any lightheadness or dizziness, no syncope - he was discharged with an event monitor to evaluate for higher grade block   Past Medical History:  Diagnosis Date  . Back pain, chronic   . Diabetes mellitus   . Nonischemic cardiomyopathy (HCC)    a. EF 40% by echo in 2003 b. 03/2016: cath showing normal cors and EF of 25-35%, EF 40-45% by echo with diffuse hypokinesis.     Allergies  Allergen Reactions  . 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 Codeine Rash    headache     Current Outpatient Prescriptions  Medication Sig Dispense Refill  . glipiZIDE (GLUCOTROL) 5 MG tablet Take 5 mg by mouth 2 (two) times daily before a meal.      . losartan (COZAAR) 25 MG tablet Take 0.5 tablets (12.5 mg total) by mouth daily. 30 tablet 6  . metFORMIN (GLUCOPHAGE) 500 MG tablet Take 500 mg by mouth 2 (two) times daily with a meal.      . morphine (KADIAN) 30 MG 24 hr capsule Take 30 mg by mouth 2 (two) times daily.      . rosuvastatin (CRESTOR) 20 MG tablet Take 1 tablet (20 mg total) by mouth daily at 6 PM. 30 tablet 6   No current facility-administered medications for this visit.      Past Surgical History:  Procedure Laterality Date  . ANKLE SURGERY    . APPENDECTOMY    . BACK SURGERY     x3  . CARDIAC CATHETERIZATION  2002    . CARDIAC CATHETERIZATION N/A 04/08/2016   Procedure: Left Heart Cath and Coronary Angiography;  Surgeon: Peter M SwazilandJordan, MD;  [facility];  Service: Cardiovascular;  Laterality: N/A;  . CHOLECYSTECTOMY    . ELBOW SURGERY    . THUMB FUSION       Allergies  Allergen Reactions  . 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 Codeine Rash    headache      Family History  Problem Relation Age of Onset  . Stroke Other   . Pancreatic cancer Father      Social History Dalton Porter reports that he has never smoked. He has never used smokeless tobacco. Dalton Porter reports that he does not drink alcohol.   Review of Systems CONSTITUTIONAL: No weight loss, fever, chills, weakness or fatigue.  HEENT: Eyes: No visual loss, blurred vision, double vision or yellow sclerae.No hearing loss, sneezing, congestion, runny nose or sore throat.  SKIN: No rash or itching.  CARDIOVASCULAR: per HPI RESPIRATORY: No shortness of breath, cough or sputum.  GASTROINTESTINAL: No anorexia, nausea, vomiting or diarrhea. No abdominal pain or blood.  GENITOURINARY: No burning on urination, no polyuria NEUROLOGICAL: No headache, dizziness, syncope, paralysis, ataxia, numbness or tingling in the extremities. No change in  bowel or bladder control.  MUSCULOSKELETAL: No muscle, back pain, joint pain or stiffness.  LYMPHATICS: No enlarged nodes. No history of splenectomy.  PSYCHIATRIC: No history of depression or anxiety.  ENDOCRINOLOGIC: No reports of sweating, cold or heat intolerance. No polyuria or polydipsia.  [name]   Physical Examination Vitals:   04/22/16 1130  BP: 115/72  Pulse: 83   Vitals:   04/22/16 1130  Weight: 228 lb 3.2 oz (103.5 kg)  Height: 5\' 11"  (1.803 m)    Gen: resting comfortably, no acute distress HEENT: no scleral icterus, pupils equal round and reactive, no palptable cervical adenopathy,  CV: RRR, no m/r/g, no jvd Resp: Clear to auscultation bilaterally GI: abdomen is soft,  non-tender, non-distended, normal bowel sounds, no hepatosplenomegaly MSK: extremities are warm, no edema.  Skin: warm, no rash Neuro:  no focal deficits Psych: appropriate affect   Diagnostic Studies 03/2016 cath  There is severe left ventricular systolic dysfunction.  LV end diastolic pressure is mildly elevated.  The left ventricular ejection fraction is 25-35% by visual estimate.   1. Normal coronary anatomy 2. Severe LV dysfunction- global 3. Mildly elevated LVEDP  Plan: optimize medical therapy for CHF.  03/2016 echo Study Conclusions  - Left ventricle: The cavity size was normal. There was mild focal   basal hypertrophy of the septum. Systolic function was mildly to   moderately reduced. The estimated ejection fraction was in the   range of 40% to 45%. Diffuse hypokinesis. Doppler parameters are   consistent with abnormal left ventricular relaxation (grade 1   diastolic dysfunction). - Mitral valve: There was mild regurgitation. - Left atrium: The atrium was mildly dilated. - Pulmonary arteries: Systolic pressure was mildly increased.    Assessment and Plan  1. Chronic systolic HF - NICM, LVEF 40-45%. NYHA I - medical therapy limited due to Wenckebach block noted during recent admission. F/u event monitor, if not significant bradycardia or high grade block try low dose beta blocker next visit - continue current meds at this time - f/u cardiac MRI  2. Heart block - Wenchebach block noted during recent admission,discharged with event monitor - f/u results  3. Chest pain - resolved, negative cath recently - continue to monitor  F/u 1 month    Antoine Poche, M.D.

## 2016-04-22 NOTE — Patient Instructions (Signed)
Your physician recommends that you schedule a follow-up appointment in: 1 MONTH WITH DR BRANCH  Your physician recommends that you continue on your current medications as directed. Please refer to the Current Medication list given to you today.  Thank you for choosing [facility]!!    

## 2016-04-23 ENCOUNTER — Ambulatory Visit (HOSPITAL_COMMUNITY): Admission: RE | Admit: 2016-04-23 | Payer: Medicare Other | Source: Ambulatory Visit

## 2016-05-02 ENCOUNTER — Ambulatory Visit (HOSPITAL_COMMUNITY)
Admission: RE | Admit: 2016-05-02 | Discharge: 2016-05-02 | Disposition: A | Discharge status: Home / Self Care | Payer: Medicare Other | Source: Ambulatory Visit | Attending: Cardiology | Admitting: Cardiology

## 2016-05-02 DIAGNOSIS — I071 Rheumatic tricuspid insufficiency: Secondary | ICD-10-CM | POA: Diagnosis not present

## 2016-05-02 DIAGNOSIS — I429 Cardiomyopathy, unspecified: Secondary | ICD-10-CM | POA: Diagnosis not present

## 2016-05-02 DIAGNOSIS — I420 Dilated cardiomyopathy: Secondary | ICD-10-CM | POA: Insufficient documentation

## 2016-05-02 MED ORDER — GADOBENATE DIMEGLUMINE 529 MG/ML IV SOLN
40.0000 mL | Freq: Once | INTRAVENOUS | Status: AC
  Administered 2016-05-02: 36 mL via INTRAVENOUS

## 2016-05-15 ENCOUNTER — Telehealth: Payer: Self-pay | Admitting: Cardiology

## 2016-05-15 NOTE — Telephone Encounter (Signed)
Mr. Misner called wanting to speak with someone about his monitor.

## 2016-05-15 NOTE — Telephone Encounter (Signed)
Pt wanting to know when he could take monitor off and mail back - developed small rashes - suggested he change pad everyday and place in different areas according to guidelines for placement - per [employer] baseline was 10/18 - pt will take off Saturday (30days) and mail back

## 2016-05-21 ENCOUNTER — Telehealth: Payer: Self-pay | Admitting: *Deleted

## 2016-05-21 NOTE — Telephone Encounter (Signed)
Pt aware - routed to pcp  

## 2016-05-21 NOTE — Telephone Encounter (Signed)
-----   Message from Antoine Poche, MD sent at 05/21/2016 12:49 PM EST ----- Heart monitor overall looks good, no significant abnormalities. Will discuss further at f/u  Dominga Ferry MD

## 2016-05-27 ENCOUNTER — Ambulatory Visit (INDEPENDENT_AMBULATORY_CARE_PROVIDER_SITE_OTHER): Payer: Medicare Other | Admitting: Cardiology

## 2016-05-27 ENCOUNTER — Encounter: Payer: Self-pay | Admitting: Cardiology

## 2016-05-27 VITALS — BP 116/74 | HR 75 | Ht 71.0 in | Wt 227.6 lb

## 2016-05-27 DIAGNOSIS — I441 Atrioventricular block, second degree: Secondary | ICD-10-CM

## 2016-05-27 DIAGNOSIS — I5022 Chronic systolic (congestive) heart failure: Secondary | ICD-10-CM

## 2016-05-27 DIAGNOSIS — R0789 Other chest pain: Secondary | ICD-10-CM | POA: Diagnosis not present

## 2016-05-27 MED ORDER — METOPROLOL SUCCINATE ER 25 MG PO TB24: 12.5000 mg | ORAL_TABLET | Freq: Every day | ORAL | 3 refills | Status: DC

## 2016-05-27 NOTE — Progress Notes (Signed)
Clinical Summary [name] is a 60 y.o.male seen today for follow up of the following medical problems.   1. Chest pain - recent admit 03/2016 with chest pain.  - cath 03/2016 with normal coronaries.  - 03/2016 echo LVEF 40-45% - cardiac MRI 04/2016 without evidence of infiltrative or inflammatory process. LVEF by MRI 33%, diffuse hypokinesis.   - no recurrent chest pain. No SOB or DOE - no LE edema.  2. Chronic systoilc HF - echo 03/2016 LVEF 40-45%, recent cath patent coronaries - medical therapy limited by soft bp's and Wenchebach heart block - from discharge notes concern about possible infiltratrive process. cardiac MRI 04/2016 without evidence of infiltrative or inflammatory process. LVEF by MRI 33%, diffuse hypokinesis.    -no recent SOB/DOE/LE edema  3. 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 OSA screen - +snoring, can some have apneic episodes. Occasional daytime somnolence.     Past Medical History:  Diagnosis Date  . Back pain, chronic   . Diabetes mellitus   . Nonischemic cardiomyopathy (HCC)    a. EF 40% by echo in 2003 b. 03/2016: cath showing normal cors and EF of 25-35%, EF 40-45% by echo with diffuse hypokinesis.     Allergies  Allergen Reactions  . 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 Codeine Rash    headache     Current Outpatient Prescriptions  Medication Sig Dispense Refill  . aspirin EC 81 MG tablet Take 81 mg by mouth daily.    [name] glipiZIDE (GLUCOTROL) 5 MG tablet Take 5 mg by mouth 2 (two) times daily before a meal.      . losartan (COZAAR) 25 MG tablet Take 0.5  tablets (12.5 mg total) by mouth daily. 30 tablet 6  . metFORMIN (GLUCOPHAGE) 500 MG tablet Take 500 mg by mouth 2 (two) times daily with a meal.      . morphine (KADIAN) 30 MG 24 hr capsule Take 30 mg by mouth 2 (two) times daily.      . rosuvastatin (CRESTOR) 20 MG tablet Take 1 tablet (20 mg total) by mouth daily at 6 PM. 30 tablet 6   No current facility-administered medications for this visit.      Past Surgical History:  Procedure Laterality Date  . ANKLE SURGERY    . APPENDECTOMY    . BACK SURGERY     x3  . CARDIAC CATHETERIZATION  2002  . CARDIAC CATHETERIZATION N/A 04/08/2016   Procedure: Left Heart Cath and Coronary Angiography;  Surgeon: Peter M Swaziland, MD;  Location: [facility] INVASIVE CV LAB;  Service: Cardiovascular;  Laterality: N/A;  . CHOLECYSTECTOMY    . ELBOW SURGERY    . THUMB FUSION       Allergies  Allergen Reactions  . 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 Codeine Rash    headache      Family History  Problem Relation Age of Onset  . Stroke Other   .  Pancreatic cancer Father      Social History Dalton Porter reports that he has never smoked. He has never used smokeless tobacco. Dalton Porter reports that he does not drink alcohol.   Review of Systems CONSTITUTIONAL: No weight loss, fever, chills, weakness or fatigue.  HEENT: Eyes: No visual loss, blurred vision, double vision or yellow sclerae.No hearing loss, sneezing, congestion, runny nose or sore throat.  SKIN: No rash or itching.  CARDIOVASCULAR: per HPI RESPIRATORY: No shortness of breath, cough or sputum.  GASTROINTESTINAL: No anorexia, nausea, vomiting or diarrhea. No abdominal pain or blood.  GENITOURINARY: No burning on urination, no polyuria NEUROLOGICAL: No headache, dizziness, syncope, paralysis, ataxia, numbness or tingling in the extremities. No change in bowel or bladder control.    MUSCULOSKELETAL: No muscle, back pain, joint pain or stiffness.  LYMPHATICS: No enlarged nodes. No history of splenectomy.  PSYCHIATRIC: No history of depression or anxiety.  ENDOCRINOLOGIC: No reports of sweating, cold or heat intolerance. No polyuria or polydipsia.  [name].   Physical Examination Vitals:   05/27/16 1619  BP: 116/74  Pulse: 75   Vitals:   05/27/16 1619  Weight: 227 lb 9.6 oz (103.2 kg)  Height: 5\' 11"  (1.803 m)    Gen: resting comfortably, no acute distress HEENT: no scleral icterus, pupils equal round and reactive, no palptable cervical adenopathy,  CV: RRR, no m/r/g, no jvd Resp: Clear to auscultation bilaterally GI: abdomen is soft, non-tender, non-distended, normal bowel sounds, no hepatosplenomegaly MSK: extremities are warm, no edema.  Skin: warm, no rash Neuro:  no focal deficits Psych: appropriate affect   Diagnostic Studies  03/2016 cath  There is severe left ventricular systolic dysfunction.  LV end diastolic pressure is mildly elevated.  The left ventricular ejection fraction is 25-35% by visual estimate.  1. Normal coronary anatomy 2. Severe LV dysfunction- global 3. Mildly elevated LVEDP  Plan: optimize medical therapy for CHF.  03/2016 echo Study Conclusions  - Left ventricle: The cavity size was normal. There was mild focal basal hypertrophy of the septum. Systolic function was mildly to moderately reduced. The estimated ejection fraction was in the range of 40% to 45%. Diffuse hypokinesis. Doppler parameters are consistent with abnormal left ventricular relaxation (grade 1 diastolic dysfunction). - Mitral valve: There was mild regurgitation. - Left atrium: The atrium was mildly dilated. - Pulmonary arteries: Systolic pressure was mildly increased.   04/2016 Cardiac MRI IMPRESSION: 1. Moderately dilated left ventricle with normal wall thickness and moderately decreased systolic function (LVEF = 33%) with  diffuse hypokinesis.  There is no late gadolinium enhancement.  There is no evidence for infiltrative or inflammatory myocardial disease. 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  2. Normal right ventricular size, thickness and systolic function (RVEF = 50%) with no wall motion abnormalities.  3.  Moderately dilated left atrium (45 mm).  4. Normal size of the aortic root, ascending aorta and pulmonary artery.  5.  Mild mitral and tricuspid regurgitation.  03/2016 Event monitor  Telemetry tracings show primarily sinus rhythm with occasional first degree and second degree Mobitz I Aurora Mask(Wenchebach) block, occasional PVCs  Reported symptoms correlate with sinus rhtyhm with occasional PVCs   Assessment and Plan   1. Chronic systolic HF - NICM, LVEF 40-45%. NYHA I - medical therapy limited due to Wenckebach block noted during recent admission.  - we will try low dose Toprol XL 12.5mg  daily, avoid aggressive titration given occasional Wenchebach block.   2. Heart block - Wenchebach block noted  during recent admission and on event monitor, no high grade block - continue to monitor  3. Chest pain - no recent symptoms, negative cath recently - we will continue to monitor  4. OSA screen - signs and symptoms of OSA, he wishes to think over a possible sleep study at this time.    F/u 1 month     Antoine Poche, M.D.

## 2016-05-27 NOTE — Patient Instructions (Signed)
Your physician recommends that you schedule a follow-up appointment in: 1 MONTH WITH DR. BRANCH  Your physician has recommended you make the following change in your medication:   START TORPOL XL 12.5 MG DAILY  Thank you for choosing [facility]!!

## 2016-07-07 ENCOUNTER — Encounter: Payer: Self-pay | Admitting: Cardiology

## 2016-07-07 ENCOUNTER — Ambulatory Visit (INDEPENDENT_AMBULATORY_CARE_PROVIDER_SITE_OTHER): Payer: Medicare Other | Admitting: Cardiology

## 2016-07-07 VITALS — BP 101/66 | HR 74 | Ht 71.0 in | Wt 230.4 lb

## 2016-07-07 DIAGNOSIS — R0789 Other chest pain: Secondary | ICD-10-CM

## 2016-07-07 DIAGNOSIS — I441 Atrioventricular block, second degree: Secondary | ICD-10-CM | POA: Diagnosis not present

## 2016-07-07 DIAGNOSIS — I5022 Chronic systolic (congestive) heart failure: Secondary | ICD-10-CM

## 2016-07-07 MED ORDER — LOSARTAN POTASSIUM 25 MG PO TABS: 25.0000 mg | ORAL_TABLET | Freq: Every day | ORAL | 6 refills | Status: DC

## 2016-07-07 NOTE — Patient Instructions (Addendum)
Medication Instructions:   Increase Losartan to 25mg  daily.   Continue all other current medications.  Labwork:  BMET - due in 2 weeks.  Order given today.   Office will contact with results via phone or letter.    Testing/Procedures: none  Follow-Up: Your physician wants you to follow up in:  3 months.  You will receive a reminder letter in the mail one-two months in advance.  If you don't receive a letter, please call our office to schedule the follow up appointment   Any Other Special Instructions Will Be Listed Below (If Applicable).  If you need a refill on your cardiac medications before your next appointment, please call your pharmacy.

## 2016-07-07 NOTE — Progress Notes (Signed)
Clinical Summary Mr. Buckhalter is a 61 y.o.male seen today for follow up of the following medical problems.   1. Chest pain - recent admit 03/2016 with chest pain.  - cath 03/2016 with normal coronaries.  - 03/2016 echo LVEF 40-45% - cardiac MRI 04/2016 without evidence of infiltrative or inflammatory process. LVEF by MRI 33%, diffuse hypokinesis.   - no recent symptoms    2. Chronic systoilc HF - echo 03/2016 LVEF 40-45%, recent cath patent coronaries - medical therapy limited by soft bp's and Wenchebach heart block - from discharge notes concern about possible infiltratrive process. cardiac MRI 04/2016 without evidence of infiltrative or inflammatory process. LVEF by MRI 33%, diffuse hypokinesis.   - last visit we started Toprol XL 12.5mg  daily. Denies any significant symptoms.  - No recent SOB or DOE. No recent LE edema - limiting sodium intake. Avoid NSAIDs. Home weights stable around 228-230s.    3. 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  - denies any recent symptoms.   4. OSA screen - +snoring, can some have apneic episodes. Occasional daytime somnolence.  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   Past Medical History:  Diagnosis Date  . Back pain, chronic   . Diabetes mellitus   . Nonischemic cardiomyopathy (HCC)    a. EF 40% by echo in 2003 b. 03/2016: cath showing normal cors and EF of 25-35%, EF 40-45% by echo with diffuse hypokinesis.     Allergies  Allergen Reactions  . 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 Meat Extract     Alpha  gal - different types of mammal meat - causes anything from SOB to anaphylaxis   . Codeine Rash    headache     Current Outpatient Prescriptions  Medication Sig Dispense Refill  . aspirin EC 81 MG tablet Take 81 mg by mouth daily.    [name] glipiZIDE (GLUCOTROL) 5 MG tablet Take 5 mg by mouth 2 (two) times daily before a meal.      . losartan (COZAAR) 25 MG tablet Take 0.5 tablets (12.5 mg total) by mouth daily. 30 tablet 6  . metFORMIN (GLUCOPHAGE) 500 MG tablet Take 500 mg by mouth 2 (two) times daily with a meal.      . metoprolol succinate (TOPROL XL) 25 MG 24 hr tablet Take 0.5 tablets (12.5 mg total) by mouth daily. 45 tablet 3  . morphine (KADIAN) 30 MG 24 hr capsule Take 30 mg by mouth 2 (two) times daily.      . rosuvastatin (CRESTOR) 20 MG tablet Take 1 tablet (20 mg total) by mouth daily at 6 PM. 30 tablet 6  . TRAZODONE HCL PO Take by mouth.     No current facility-administered medications for this visit.      Past Surgical History:  Procedure Laterality Date  . ANKLE SURGERY    . APPENDECTOMY    . BACK SURGERY     x3  . CARDIAC CATHETERIZATION  2002  . CARDIAC CATHETERIZATION N/A 04/08/2016   Procedure: Left Heart Cath  and Coronary Angiography;  Surgeon: Peter M Swaziland, MD;  Location: [facility] INVASIVE CV LAB;  Service: Cardiovascular;  Laterality: N/A;  . CHOLECYSTECTOMY    . ELBOW SURGERY    . THUMB FUSION       Allergies  Allergen Reactions  . 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 Codeine Rash    headache      Family History  Problem Relation Age of Onset  . Stroke Other   . Pancreatic cancer Father      Social History Mr. Galindo reports that he has never smoked. He has never used smokeless tobacco. Mr. Szilagyi reports that he does not drink alcohol.   Review of Systems CONSTITUTIONAL: No weight loss, fever, chills, weakness or fatigue.  HEENT: Eyes: No  visual loss, blurred vision, double vision or yellow sclerae.No hearing loss, sneezing, congestion, runny nose or sore throat.  SKIN: No rash or itching.  CARDIOVASCULAR: per HPI RESPIRATORY: No shortness of breath, cough or sputum.  GASTROINTESTINAL: No anorexia, nausea, vomiting or diarrhea. No abdominal pain or blood.  GENITOURINARY: No burning on urination, no polyuria NEUROLOGICAL: No headache, dizziness, syncope, paralysis, ataxia, numbness or tingling in the extremities. No change in bowel or bladder control.  MUSCULOSKELETAL: No muscle, back pain, joint pain or stiffness.  LYMPHATICS: No enlarged nodes. No history of splenectomy.  PSYCHIATRIC: No history of depression or anxiety.  ENDOCRINOLOGIC: No reports of sweating, cold or heat intolerance. No polyuria or polydipsia.  [name]   Physical Examination Vitals:   07/07/16 0934  BP: 101/66  Pulse: 74   Vitals:   07/07/16 0934  Weight: 230 lb 6.4 oz (104.5 kg)  Height: 5\' 11"  (1.803 m)    Gen: resting comfortably, no acute distress HEENT: no scleral icterus, pupils equal round and reactive, no palptable cervical adenopathy,  CV: RRR, 2/6 systolic murmur RUSB, no jvd Resp: Clear to auscultation bilaterally GI: abdomen is soft, non-tender, non-distended, normal bowel sounds, no hepatosplenomegaly MSK: extremities are warm, no edema.  Skin: warm, no rash Neuro:  no focal deficits Psych: appropriate affect   Diagnostic Studies 03/2016 cath  There is severe left ventricular systolic dysfunction.  LV end diastolic pressure is mildly elevated.  The left ventricular ejection fraction is 25-35% by visual estimate.  1. Normal coronary anatomy 2. Severe LV dysfunction- global 3. Mildly elevated LVEDP  Plan: optimize medical therapy for CHF.  03/2016 echo Study Conclusions  - Left ventricle: The cavity size was normal. There was mild focal basal hypertrophy of the septum. Systolic function was mildly to moderately  reduced. The estimated ejection fraction was in the range of 40% to 45%. Diffuse hypokinesis. Doppler parameters are consistent with abnormal left ventricular relaxation (grade 1 diastolic dysfunction). - Mitral valve: There was mild regurgitation. - Left atrium: The atrium was mildly dilated. - Pulmonary arteries: Systolic pressure was mildly increased.   04/2016 Cardiac MRI IMPRESSION: 1. Moderately dilated left ventricle with normal wall thickness and moderately decreased systolic function (LVEF = 33%) with diffuse hypokinesis.  There is no late gadolinium enhancement.  There is no evidence for infiltrative or inflammatory myocardial disease. 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  2. Normal right ventricular size, thickness and systolic function (RVEF = 50%) with no wall motion abnormalities.  3. Moderately dilated left atrium (45 mm).  4. Normal size of the aortic root, ascending  aorta and pulmonary artery.  5. Mild mitral and tricuspid regurgitation.  03/2016 Event monitor  Telemetry tracings show primarily sinus rhythm with occasional first degree and second degree Mobitz I Aurora Mask) block, occasional PVCs  Reported symptoms correlate with sinus rhtyhm with occasional PVCs    Assessment and Plan  1. Chronic systolic HF - NICM, LVEF 40-45%. NYHA I. No current symptoms.  - medical therapy limited due to Wenckebach block noted during recent admission.  - we will increase losartan to 25mg  daily, repeat BMET in 2 weeks.   2. Heart block - Wenchebach block noted during recent admission and on event monitor, no high grade block - we will continue to monitor  3. Chest pain - no recent symptoms, negative cath  - we will continue to follow  4. OSA screen - signs and symptoms of OSA. He remains not interested in sleep evaluation.    F/u 3 months      Antoine Poche, M.D.

## 2016-10-07 DIAGNOSIS — I509 Heart failure, unspecified: Secondary | ICD-10-CM | POA: Diagnosis not present

## 2016-10-07 DIAGNOSIS — E119 Type 2 diabetes mellitus without complications: Secondary | ICD-10-CM | POA: Diagnosis not present

## 2016-10-07 DIAGNOSIS — S61305A Unspecified open wound of left ring finger with damage to nail, initial encounter: Secondary | ICD-10-CM | POA: Diagnosis not present

## 2016-10-07 DIAGNOSIS — Z833 Family history of diabetes mellitus: Secondary | ICD-10-CM | POA: Diagnosis not present

## 2016-10-07 DIAGNOSIS — Z79899 Other long term (current) drug therapy: Secondary | ICD-10-CM | POA: Diagnosis not present

## 2016-10-07 DIAGNOSIS — S62636A Displaced fracture of distal phalanx of right little finger, initial encounter for closed fracture: Secondary | ICD-10-CM | POA: Diagnosis not present

## 2016-10-07 DIAGNOSIS — W228XXA Striking against or struck by other objects, initial encounter: Secondary | ICD-10-CM | POA: Diagnosis not present

## 2016-10-07 DIAGNOSIS — S62636B Displaced fracture of distal phalanx of right little finger, initial encounter for open fracture: Secondary | ICD-10-CM | POA: Diagnosis not present

## 2016-10-07 DIAGNOSIS — I252 Old myocardial infarction: Secondary | ICD-10-CM | POA: Diagnosis not present

## 2016-10-07 DIAGNOSIS — Z23 Encounter for immunization: Secondary | ICD-10-CM | POA: Diagnosis not present

## 2016-10-07 DIAGNOSIS — Z7984 Long term (current) use of oral hypoglycemic drugs: Secondary | ICD-10-CM | POA: Diagnosis not present

## 2016-10-13 DIAGNOSIS — S62606D Fracture of unspecified phalanx of right little finger, subsequent encounter for fracture with routine healing: Secondary | ICD-10-CM | POA: Diagnosis not present

## 2016-10-27 DIAGNOSIS — S62606D Fracture of unspecified phalanx of right little finger, subsequent encounter for fracture with routine healing: Secondary | ICD-10-CM | POA: Diagnosis not present

## 2017-09-27 DIAGNOSIS — I509 Heart failure, unspecified: Secondary | ICD-10-CM | POA: Diagnosis not present

## 2017-09-27 DIAGNOSIS — Z91018 Allergy to other foods: Secondary | ICD-10-CM | POA: Diagnosis not present

## 2017-09-27 DIAGNOSIS — Z833 Family history of diabetes mellitus: Secondary | ICD-10-CM | POA: Diagnosis not present

## 2017-09-27 DIAGNOSIS — I5023 Acute on chronic systolic (congestive) heart failure: Secondary | ICD-10-CM | POA: Diagnosis not present

## 2017-09-27 DIAGNOSIS — F419 Anxiety disorder, unspecified: Secondary | ICD-10-CM | POA: Diagnosis not present

## 2017-09-27 DIAGNOSIS — R079 Chest pain, unspecified: Secondary | ICD-10-CM | POA: Diagnosis not present

## 2017-09-27 DIAGNOSIS — I252 Old myocardial infarction: Secondary | ICD-10-CM | POA: Diagnosis not present

## 2017-09-27 DIAGNOSIS — R0600 Dyspnea, unspecified: Secondary | ICD-10-CM | POA: Diagnosis not present

## 2017-09-27 DIAGNOSIS — Z91010 Allergy to peanuts: Secondary | ICD-10-CM | POA: Diagnosis not present

## 2017-09-27 DIAGNOSIS — I5031 Acute diastolic (congestive) heart failure: Secondary | ICD-10-CM | POA: Diagnosis not present

## 2017-09-27 DIAGNOSIS — I10 Essential (primary) hypertension: Secondary | ICD-10-CM | POA: Diagnosis not present

## 2017-09-27 DIAGNOSIS — R0602 Shortness of breath: Secondary | ICD-10-CM | POA: Diagnosis not present

## 2017-09-27 DIAGNOSIS — I959 Hypotension, unspecified: Secondary | ICD-10-CM | POA: Diagnosis not present

## 2017-09-27 DIAGNOSIS — R7989 Other specified abnormal findings of blood chemistry: Secondary | ICD-10-CM | POA: Diagnosis not present

## 2017-09-27 DIAGNOSIS — Z886 Allergy status to analgesic agent status: Secondary | ICD-10-CM | POA: Diagnosis not present

## 2017-09-27 DIAGNOSIS — I5033 Acute on chronic diastolic (congestive) heart failure: Secondary | ICD-10-CM | POA: Diagnosis not present

## 2017-09-27 DIAGNOSIS — I2510 Atherosclerotic heart disease of native coronary artery without angina pectoris: Secondary | ICD-10-CM | POA: Diagnosis present

## 2017-09-27 DIAGNOSIS — Z8249 Family history of ischemic heart disease and other diseases of the circulatory system: Secondary | ICD-10-CM | POA: Diagnosis not present

## 2017-09-27 DIAGNOSIS — I5030 Unspecified diastolic (congestive) heart failure: Secondary | ICD-10-CM | POA: Diagnosis not present

## 2017-09-27 DIAGNOSIS — I110 Hypertensive heart disease with heart failure: Secondary | ICD-10-CM | POA: Diagnosis not present

## 2017-09-27 DIAGNOSIS — R05 Cough: Secondary | ICD-10-CM | POA: Diagnosis not present

## 2017-09-27 DIAGNOSIS — Z7982 Long term (current) use of aspirin: Secondary | ICD-10-CM | POA: Diagnosis not present

## 2017-09-27 DIAGNOSIS — Z7984 Long term (current) use of oral hypoglycemic drugs: Secondary | ICD-10-CM | POA: Diagnosis not present

## 2017-09-27 DIAGNOSIS — I700 Atherosclerosis of aorta: Secondary | ICD-10-CM | POA: Diagnosis not present

## 2017-09-27 DIAGNOSIS — R0789 Other chest pain: Secondary | ICD-10-CM | POA: Diagnosis not present

## 2017-09-27 DIAGNOSIS — E119 Type 2 diabetes mellitus without complications: Secondary | ICD-10-CM | POA: Diagnosis not present

## 2017-09-27 DIAGNOSIS — Z79899 Other long term (current) drug therapy: Secondary | ICD-10-CM | POA: Diagnosis not present

## 2017-09-27 DIAGNOSIS — J441 Chronic obstructive pulmonary disease with (acute) exacerbation: Secondary | ICD-10-CM | POA: Diagnosis not present

## 2017-09-27 DIAGNOSIS — I429 Cardiomyopathy, unspecified: Secondary | ICD-10-CM | POA: Diagnosis not present

## 2018-01-26 HISTORY — PX: ICD IMPLANT: EP1208

## 2018-04-18 DIAGNOSIS — E119 Type 2 diabetes mellitus without complications: Secondary | ICD-10-CM | POA: Diagnosis not present

## 2018-04-18 DIAGNOSIS — I509 Heart failure, unspecified: Secondary | ICD-10-CM | POA: Diagnosis not present

## 2018-04-18 DIAGNOSIS — Z7982 Long term (current) use of aspirin: Secondary | ICD-10-CM | POA: Diagnosis not present

## 2018-04-18 DIAGNOSIS — Z95810 Presence of automatic (implantable) cardiac defibrillator: Secondary | ICD-10-CM | POA: Diagnosis not present

## 2018-04-18 DIAGNOSIS — R0602 Shortness of breath: Secondary | ICD-10-CM | POA: Diagnosis not present

## 2018-04-18 DIAGNOSIS — M542 Cervicalgia: Secondary | ICD-10-CM | POA: Diagnosis not present

## 2018-04-18 DIAGNOSIS — R079 Chest pain, unspecified: Secondary | ICD-10-CM | POA: Diagnosis not present

## 2018-04-18 DIAGNOSIS — Z79899 Other long term (current) drug therapy: Secondary | ICD-10-CM | POA: Diagnosis not present

## 2018-04-18 DIAGNOSIS — R0789 Other chest pain: Secondary | ICD-10-CM | POA: Diagnosis not present

## 2018-04-18 DIAGNOSIS — Z7984 Long term (current) use of oral hypoglycemic drugs: Secondary | ICD-10-CM | POA: Diagnosis not present

## 2018-04-18 DIAGNOSIS — R7989 Other specified abnormal findings of blood chemistry: Secondary | ICD-10-CM | POA: Diagnosis not present

## 2018-04-18 DIAGNOSIS — R05 Cough: Secondary | ICD-10-CM | POA: Diagnosis not present

## 2018-04-18 DIAGNOSIS — I252 Old myocardial infarction: Secondary | ICD-10-CM | POA: Diagnosis not present

## 2018-04-20 DIAGNOSIS — E119 Type 2 diabetes mellitus without complications: Secondary | ICD-10-CM | POA: Diagnosis not present

## 2018-04-20 DIAGNOSIS — Z95810 Presence of automatic (implantable) cardiac defibrillator: Secondary | ICD-10-CM | POA: Diagnosis not present

## 2018-04-20 DIAGNOSIS — I252 Old myocardial infarction: Secondary | ICD-10-CM | POA: Diagnosis not present

## 2018-04-20 DIAGNOSIS — R079 Chest pain, unspecified: Secondary | ICD-10-CM | POA: Diagnosis not present

## 2018-04-20 DIAGNOSIS — Z91010 Allergy to peanuts: Secondary | ICD-10-CM | POA: Diagnosis not present

## 2018-04-20 DIAGNOSIS — R0789 Other chest pain: Secondary | ICD-10-CM | POA: Diagnosis not present

## 2018-04-20 DIAGNOSIS — Z886 Allergy status to analgesic agent status: Secondary | ICD-10-CM | POA: Diagnosis not present

## 2018-04-20 DIAGNOSIS — I110 Hypertensive heart disease with heart failure: Secondary | ICD-10-CM | POA: Diagnosis not present

## 2018-04-20 DIAGNOSIS — I959 Hypotension, unspecified: Secondary | ICD-10-CM | POA: Diagnosis not present

## 2018-04-20 DIAGNOSIS — R072 Precordial pain: Secondary | ICD-10-CM | POA: Diagnosis not present

## 2018-04-20 DIAGNOSIS — R0902 Hypoxemia: Secondary | ICD-10-CM | POA: Diagnosis not present

## 2018-04-20 DIAGNOSIS — I509 Heart failure, unspecified: Secondary | ICD-10-CM | POA: Diagnosis not present

## 2018-05-07 ENCOUNTER — Encounter: Payer: Self-pay | Admitting: Cardiology

## 2018-05-07 ENCOUNTER — Encounter: Payer: Self-pay | Admitting: *Deleted

## 2018-05-07 ENCOUNTER — Ambulatory Visit (INDEPENDENT_AMBULATORY_CARE_PROVIDER_SITE_OTHER): Payer: No Typology Code available for payment source | Admitting: Cardiology

## 2018-05-07 VITALS — BP 106/66 | HR 88 | Ht 71.0 in | Wt 239.4 lb

## 2018-05-07 DIAGNOSIS — I5022 Chronic systolic (congestive) heart failure: Secondary | ICD-10-CM

## 2018-05-07 DIAGNOSIS — Z95810 Presence of automatic (implantable) cardiac defibrillator: Secondary | ICD-10-CM

## 2018-05-07 MED ORDER — SACUBITRIL-VALSARTAN 24-26 MG PO TABS: 1.0000 | ORAL_TABLET | Freq: Two times a day (BID) | ORAL | 0 refills | Status: DC

## 2018-05-07 MED ORDER — SACUBITRIL-VALSARTAN 24-26 MG PO TABS: 1.0000 | ORAL_TABLET | Freq: Two times a day (BID) | ORAL | 3 refills | Status: DC

## 2018-05-07 NOTE — Progress Notes (Signed)
Clinical Summary Mr. Lamere is a 62 y.o.male seen today for follow up of the following medical problems.   1. Chest pain - recent admit 03/2016 with chest pain.  - cath 03/2016 with normal coronaries.  - 03/2016 echo LVEF 40-45% - cardiac MRI 04/2016 without evidence of infiltrative or inflammatory process. LVEF by MRI 33%, diffuse hypokinesis.   - - denies any recent chest pain.     2. Chronic systoilc HF - echo 03/2016 LVEF 40-45%, recent cath patent coronaries - medical therapy limited by soft bp's and Wenchebach heart block - from discharge notes concern about possible infiltratrive process. cardiac MRI 04/2016 without evidence of infiltrative or inflammatory process. LVEF by MRI 33%, diffuse hypokinesis.    - [facility] L[facility]F 30%, grade II diastolic dysfunction - ICD placed January 26, 2018 at [facility] in  [place] - had follow about 6 weeks after  - can have some SOB/DOE with about 1 block, stable over the last few years - occasional swelling at times. Limiting sodium intake. - checking weights daily, typically 239 lbs and stable.        3. 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 OSA screen - +snoring, can some have apneic episodes. Occasional daytime somnolence.  - has not been interested in sleep study  - has not had sleep study since last visti.     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    Past Medical History:  Diagnosis Date  . Back pain, chronic   . Diabetes mellitus   . Nonischemic cardiomyopathy (HCC)    a. EF 40% by echo in 2003 b. 03/2016: cath showing normal cors and EF of 25-35%, EF 40-45% by echo with diffuse hypokinesis.      Allergies  Allergen Reactions  . 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 Codeine Rash    headache     Current Outpatient Medications  Medication Sig Dispense Refill  . aspirin EC 81 MG tablet Take 81 mg by mouth daily.    . Cholecalciferol (VITAMIN D3) 5000 units CAPS Take 1 capsule by mouth daily.    [name] glipiZIDE (GLUCOTROL) 5 MG tablet Take 5 mg by mouth 2 (two) times daily before a meal.      . losartan (COZAAR) 25 MG tablet Take 1 tablet (25 mg total) by mouth daily. 30 tablet 6  . metFORMIN (GLUCOPHAGE) 500 MG tablet Take 500 mg by mouth 2 (two) times daily with a meal.      . metoprolol succinate (TOPROL XL) 25 MG 24 hr tablet Take 0.5 tablets (12.5 mg total) by mouth daily. 45 tablet 3  . morphine (KADIAN) 30 MG 24 hr capsule Take 30 mg by mouth 2 (two) times daily.      . rosuvastatin (CRESTOR) 20 MG tablet Take 1 tablet (20 mg total) by mouth daily at 6 PM. 30 tablet 6  . TRAZODONE HCL PO Take by mouth.     No current facility-administered medications for this visit.      Past  Surgical History:  Procedure Laterality Date  . ANKLE SURGERY    . APPENDECTOMY    . BACK SURGERY     x3  . CARDIAC CATHETERIZATION  2002  . CARDIAC CATHETERIZATION N/A 04/08/2016   Procedure: Left Heart Cath and Coronary Angiography;  Surgeon: Peter M Swaziland, MD;  Location: [facility] INVASIVE CV LAB;  Service: Cardiovascular;  Laterality: N/A;  . CHOLECYSTECTOMY    . ELBOW SURGERY    . THUMB FUSION       Allergies  Allergen Reactions  . 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 Codeine Rash    headache      Family History  Problem Relation Age of Onset  . Stroke Other   . Pancreatic cancer Father      Social History Mr. Pullin reports that he has never smoked. He has never used smokeless  tobacco. Mr. Calabretta reports that he does not drink alcohol.   Review of Systems CONSTITUTIONAL: No weight loss, fever, chills, weakness or fatigue.  HEENT: Eyes: No visual loss, blurred vision, double vision or yellow sclerae.No hearing loss, sneezing, congestion, runny nose or sore throat.  SKIN: No rash or itching.  CARDIOVASCULAR: per hpi RESPIRATORY: No shortness of breath, cough or sputum.  GASTROINTESTINAL: No anorexia, nausea, vomiting or diarrhea. No abdominal pain or blood.  GENITOURINARY: No burning on urination, no polyuria NEUROLOGICAL: No headache, dizziness, syncope, paralysis, ataxia, numbness or tingling in the extremities. No change in bowel or bladder control.  MUSCULOSKELETAL: No muscle, back pain, joint pain or stiffness.  LYMPHATICS: No enlarged nodes. No history of splenectomy.  PSYCHIATRIC: No history of depression or anxiety.  ENDOCRINOLOGIC: No reports of sweating, cold or heat intolerance. No polyuria or polydipsia.  [name]   Physical Examination Vitals:   05/07/18 0923  BP: 106/66  Pulse: 88  SpO2: 97%   Vitals:   05/07/18 0923  Weight: 239 lb 6.4 oz (108.6 kg)  Height: 5\' 11"  (1.803 m)    Gen: resting comfortably, no acute distress HEENT: no scleral icterus, pupils equal round and reactive, no palptable cervical adenopathy,  CV: RRR, no m/r/g, no jvd Resp: Clear to auscultation bilaterally GI: abdomen is soft, non-tender, non-distended, normal bowel sounds, no hepatosplenomegaly MSK: extremities are warm, no edema.  Skin: warm, no rash Neuro:  no focal deficits Psych: appropriate affect   Diagnostic Studies 03/2016 cath  There is severe left ventricular systolic dysfunction.  LV end diastolic pressure is mildly elevated.  The left ventricular ejection fraction is 25-35% by visual estimate.  1. Normal coronary anatomy 2. Severe LV dysfunction- global 3. Mildly elevated LVEDP  Plan: optimize medical therapy for CHF.  03/2016  echo Study Conclusions  - Left ventricle: The cavity size was normal. There was mild focal basal hypertrophy of the septum. Systolic function was mildly to moderately reduced. The estimated ejection fraction was in the range of 40% to 45%. Diffuse hypokinesis. Doppler parameters are consistent with abnormal left ventricular relaxation (grade 1 diastolic dysfunction). - Mitral valve: There was mild regurgitation. - Left atrium: The atrium was mildly dilated. - Pulmonary arteries: Systolic pressure was mildly increased.   04/2016 Cardiac MRI IMPRESSION: 1. Moderately dilated left ventricle with normal wall thickness and moderately decreased systolic function (LVEF = 33%) with diffuse hypokinesis.  There is no late gadolinium enhancement.  There is no evidence for infiltrative or inflammatory myocardial  disease. 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  2. Normal right ventricular size, thickness and systolic function (RVEF = 50%) with no wall motion abnormalities.  3. Moderately dilated left atrium (45 mm).  4. Normal size of the aortic root, ascending aorta and pulmonary artery.  5. Mild mitral and tricuspid regurgitation.  03/2016 Event monitor  Telemetry tracings show primarily sinus rhythm with occasional first degree and second degree Mobitz I Aurora Mask) block, occasional PVCs  Reported symptoms correlate with sinus rhtyhm with occasional PVCs   11[facility]2017 [facility] Wenchebach block noted. NSVT up to 19 beats.   [facility] LVEF 30%   Assessment and Plan  1. Chronic systolic HF - nearly 2 years since our last visit, he had followed with [facility] hospital and is reestablishing - last LVEF 30%. 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 WIll have him establish in our device clinic - d/c losartan, start entresto 24/26mg  bid. Lower aldactone to 25mg  daily. Continue lasix 40mg  in AM and 20mg   in PM  2. OSA screen - signs and symptoms of OSA. - he will discuss with his PCP having a sleep study through the [place] system      F/u 6 weeks      Antoine Poche, M.D.

## 2018-05-07 NOTE — Patient Instructions (Addendum)
Your physician recommends that you schedule a follow-up appointment in: 6 WEEKS WITH DR [facility]  Your physician has recommended you make the following change in your medication:   STOP LOSARTAN   START ENTRESTO 24/26 MG TWICE DAILY - WE HAVE GIVEN YOU A WRITTEN RX ALONG WITH SAMPLES   PLEASE CONTACT YOUR PRIMARY CARE PROVIDER TO SCHEDULE A SLEEP STUDY AS RECOMMENDED BY DR BRANCH   You have been referred to DEVICE CLINIC    Thank you for choosing [facility]!!

## 2018-05-31 ENCOUNTER — Encounter: Payer: Self-pay | Admitting: Internal Medicine

## 2018-05-31 ENCOUNTER — Ambulatory Visit (INDEPENDENT_AMBULATORY_CARE_PROVIDER_SITE_OTHER): Payer: No Typology Code available for payment source | Admitting: Internal Medicine

## 2018-05-31 VITALS — BP 114/66 | HR 74 | Ht 71.0 in | Wt 259.0 lb

## 2018-05-31 DIAGNOSIS — I5022 Chronic systolic (congestive) heart failure: Secondary | ICD-10-CM | POA: Diagnosis not present

## 2018-05-31 LAB — CUP PACEART INCLINIC DEVICE CHECK
Brady Statistic RV Percent Paced: 0.97 %
Date Time Interrogation Session: 20191202172134
HIGH POWER IMPEDANCE MEASURED VALUE: 72 Ohm
Lead Channel Impedance Value: 462.5 Ohm
Lead Channel Pacing Threshold Amplitude: 0.75 V
Lead Channel Pacing Threshold Pulse Width: 0.5 ms
Lead Channel Sensing Intrinsic Amplitude: 12 mV
Lead Channel Setting Pacing Amplitude: 2.5 V
MDC IDC LEAD IMPLANT DT: 20190730
MDC IDC LEAD LOCATION: 753860
MDC IDC MSMT BATTERY REMAINING LONGEVITY: 112 mo
MDC IDC PG IMPLANT DT: 20190730
MDC IDC SET LEADCHNL RV PACING PULSEWIDTH: 0.5 ms
MDC IDC SET LEADCHNL RV SENSING SENSITIVITY: 0.5 mV
Pulse Gen Serial Number: 9839597

## 2018-05-31 NOTE — Patient Instructions (Addendum)
Medication Instructions:  Your physician recommends that you continue on your current medications as directed. Please refer to the Current Medication list given to you today.  *If you need a refill on your cardiac medications before your next appointment, please call your pharmacy*  Labwork: None ordered  Testing/Procedures: None ordered  Follow-Up: You will be enrolled in our [facility] clinic.  Jacki Cones, RN will contact you to arrange this monitoring from home.  Remote monitoring is used to monitor your Pacemaker or ICD from home. This monitoring reduces the number of office visits required to check your device to one time per year. It allows [place] to keep an eye on the functioning of your device to ensure it is working properly. You are scheduled for a device check from home on 08/30/2018. You may send your transmission at any time that day. If you have a wireless device, the transmission will be sent automatically. After your physician reviews your transmission, you will receive a postcard with your next transmission date.  Your physician wants you to follow-up in: 1 year with Dr. Johney Frame in [place].  You will receive a reminder letter in the mail two months in advance. If you don't receive a letter, please call our office to schedule the follow-up appointment.  Thank you for choosing [facility]!!

## 2018-05-31 NOTE — Progress Notes (Signed)
Dalton Porter., MD: Primary Cardiologist:  Dr Wyline Mood EP:  Dr Maxine Glenn is a 62 y.o. male with a h/o nonischemic CM, EF 30%, chronic systolic dysfunction sp ICD (SJM) by the [facility] who presents today to establish care in the Electrophysiology device clinic.   The patient reports doing very well since having an ICD implanted and remains very active.  Cath 2017 revealed normal cors.  EF 30%.  Medical therapy has been limited by soft BPs and bradycardia.  He has been observed to have mobitz I second degree AV block.  Today, he  denies symptoms of palpitations, chest pain, shortness of breath, orthopnea, PND, lower extremity edema, dizziness, presyncope, syncope, or neurologic sequela.  The patientis tolerating medications without difficulties and is otherwise without complaint today.   Past Medical History:  Diagnosis Date  . Back pain, chronic   . Diabetes mellitus   . Nonischemic cardiomyopathy (HCC)    a. EF 40% by echo in 2003 b. 03/2016: cath showing normal cors and EF of 25-35%, EF 40-45% by echo with diffuse hypokinesis.   Past Surgical History:  Procedure Laterality Date  . ANKLE SURGERY    . APPENDECTOMY    . BACK SURGERY     x3  . CARDIAC CATHETERIZATION  2002  . CARDIAC CATHETERIZATION N/A 04/08/2016   Procedure: Left Heart Cath and Coronary Angiography;  Surgeon: Peter M Swaziland, MD;  Location: [facility] INVASIVE CV LAB;  Service: Cardiovascular;  Laterality: N/A;  . CHOLECYSTECTOMY    . ELBOW SURGERY    . ICD IMPLANT  01/26/2018   SJM Fortify Assura VR ICD implanted by Dr Marily Memos for primary prevention of sudden death  . THUMB FUSION      Social History   Socioeconomic History  . Marital status: Married    Spouse name: Not on file  . Number of children: Not on file  . Years of education: Not on file  . Highest education level: Not on file  Occupational History  . Not on file  Social Needs  . Financial resource strain: Not on file  . Food  insecurity:    Worry: Not on file    Inability: Not on file  . Transportation needs:    Medical: Not on file    Non-medical: Not on file  Tobacco Use  . Smoking status: Never Smoker  . Smokeless tobacco: Never Used  Substance and Sexual Activity  . Alcohol use: No  . Drug use: No  . Sexual activity: Yes  Lifestyle  . Physical activity:    Days per week: Not on file    Minutes per session: Not on file  . Stress: Not on file  Relationships  . Social connections:    Talks on phone: Not on file    Gets together: Not on file    Attends religious service: Not on file    Active member of club or organization: Not on file    Attends meetings of clubs or organizations: Not on file    Relationship status: Not on file  . Intimate partner violence:    Fear of current or ex partner: Not on file    Emotionally abused: Not on file    Physically abused: Not on file    Forced sexual activity: Not on file  Other Topics Concern  . 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 Stroke Other   . Pancreatic cancer Father     Allergies  Allergen Reactions  . 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 Codeine Rash    headache    Current Outpatient Medications  Medication Sig Dispense Refill  . aspirin EC 81 MG tablet Take 81 mg by mouth daily.    . Cholecalciferol (VITAMIN D3) 5000 units CAPS Take 1 capsule by mouth daily.    . furosemide (LASIX) 20 MG tablet Take 20 mg by mouth. Take 40 mg in the morning and 20 mg in the evening    . glipiZIDE (GLUCOTROL) 5 MG tablet Take 5 mg by mouth 2 (two) times daily before a meal.      . metFORMIN (GLUCOPHAGE) 500 MG tablet Take 500 mg by mouth 2 (two) times daily with a meal.      . metoprolol succinate (TOPROL XL) 25 MG 24 hr tablet  Take 0.5 tablets (12.5 mg total) by mouth daily. 45 tablet 3  . sacubitril-valsartan (ENTRESTO) 24-26 MG Take 1 tablet by mouth 2 (two) times daily. 28 tablet 0  . TRAZODONE HCL PO Take 100 mg by mouth at bedtime.      No current facility-administered medications for this visit.     ROS- all systems are reviewed and negative except as per HPI  Physical Exam: Vitals:   05/31/18 1444  BP: 114/66  Pulse: 74  SpO2: 98%  Weight: 259 lb (117.5 kg)  Height: 5\' 11"  (1.803 m)    GEN- The patient is well appearing, alert and oriented x 3 today.   Head- normocephalic, atraumatic Eyes-  Sclera clear, conjunctiva pink Ears- hearing intact Oropharynx- clear Neck- supple,   Lungs- Clear to ausculation bilaterally, normal work of breathing Chest- ICD pocket is well healed Heart- Regular rate and rhythm, no murmurs, rubs or gallops, PMI not laterally displaced GI- soft, NT, ND, + BS Extremities- no clubbing, cyanosis, or edema MS- no significant deformity or atrophy Skin- no rash or lesion Psych- euthymic mood, full affect Neuro- strength and sensation are intact  ICD interrogation- reviewed in detail today,  See PACEART report  Assessment and Plan:  1. Nonischemic CM/ chronic systolic dysfunction Doing well s/p ICD implantation Normal ICD interrogation today V paces <1% and histograms look ok  No indication for upgrade at this time to either dual chamber or CRT (QRS <130 msec)  [name] Enroll in [facility] device clinic Follow-up with me in [place] in a year  Hillis Range MD, [facility] 05/31/2018 3:33 PM

## 2018-06-01 DIAGNOSIS — Z23 Encounter for immunization: Secondary | ICD-10-CM | POA: Diagnosis not present

## 2018-06-21 ENCOUNTER — Ambulatory Visit: Payer: Non-veteran care | Admitting: Cardiology

## 2018-06-21 NOTE — Progress Notes (Deleted)
Clinical Summary [name] is a 62 y.o.male seen today for follow up of the following medical problems.   1. Chest pain - recent admit 03/2016 with chest pain.  - cath 03/2016 with normal coronaries.  - 03/2016 echo LVEF 40-45% - cardiac MRI 04/2016 without evidence of infiltrative or inflammatory process. LVEF by MRI 33%, diffuse hypokinesis.   - - denies any recent chest pain.     2. Chronic systoilc HF - echo 03/2016 LVEF 40-45%, recent cath patent coronaries - medical therapy limited by soft bp's and Wenchebach heart block - from discharge notes concern about possible infiltratrive process. cardiac MRI 04/2016 without evidence of infiltrative or inflammatory process. LVEF by MRI 33%, diffuse hypokinesis.    - [facility]%, grade II diastolic dysfunction - [facility]  [place] - had follow about 6 weeks after  - can have some SOB/DOE with about 1 block, stable over the last few years - occasional swelling at times. Limiting sodium intake. - checking weights daily, typically 239 lbs and stable.     - last visit changed to entresto 24/26mg  bid. -    3. 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 OSA screen - +snoring, can some have apneic episodes. Occasional daytime somnolence. - has not been interested in sleep study  - has not had sleep study since last visti.     SH: works at [employer], goes in around [employer] for morning shift.    Past Medical History:  Diagnosis Date  . Back pain, chronic   . Diabetes mellitus   . Nonischemic cardiomyopathy (HCC)    a. EF 40% by echo in 2003 b. 03/2016: cath showing normal cors and EF of  25-35%, EF 40-45% by echo with diffuse hypokinesis.     Allergies  Allergen Reactions  . 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 Codeine Rash    headache     Current Outpatient Medications  Medication Sig Dispense Refill  . aspirin EC 81 MG tablet Take 81 mg by mouth daily.    . Cholecalciferol (VITAMIN D3) 5000 units CAPS Take 1 capsule by mouth daily.    . furosemide (LASIX) 20 MG tablet Take 20 mg by mouth. Take 40 mg in the morning and 20 mg in the evening    . glipiZIDE (GLUCOTROL) 5 MG tablet Take 5 mg by mouth 2 (two) times daily before a meal.      . metFORMIN (GLUCOPHAGE) 500 MG tablet Take 500 mg by mouth 2 (two) times daily with a meal.      . metoprolol succinate (TOPROL XL) 25 MG 24 hr tablet Take 0.5 tablets (12.5 mg total) by mouth daily. 45 tablet 3  . sacubitril-valsartan (ENTRESTO) 24-26 MG Take 1 tablet by mouth 2 (two) times daily. 28 tablet 0  . TRAZODONE HCL PO Take 100 mg by mouth at bedtime.      No current facility-administered medications for this visit.      Past Surgical History:  Procedure Laterality Date  .  ANKLE SURGERY    . APPENDECTOMY    . BACK SURGERY     x3  . CARDIAC CATHETERIZATION  2002  . CARDIAC CATHETERIZATION N/A 04/08/2016   Procedure: Left Heart Cath and Coronary Angiography;  Surgeon: Peter M Swaziland, MD;  Location: [facility] INVASIVE CV LAB;  Service: Cardiovascular;  Laterality: N/A;  . CHOLECYSTECTOMY    . ELBOW SURGERY    . ICD IMPLANT  01/26/2018   SJM Fortify Assura VR ICD implanted by Dr Marily Memos for primary prevention of sudden death  . THUMB FUSION       Allergies  Allergen Reactions  . 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 Codeine Rash    headache      Family History  Problem Relation Age of Onset  . Stroke Other   .  Pancreatic cancer Father      Social History [name] reports that he has never smoked. He has never used smokeless tobacco. [name] reports no history of alcohol use.   Review of Systems CONSTITUTIONAL: No weight loss, fever, chills, weakness or fatigue.  HEENT: Eyes: No visual loss, blurred vision, double vision or yellow sclerae.No hearing loss, sneezing, congestion, runny nose or sore throat.  SKIN: No rash or itching.  CARDIOVASCULAR:  RESPIRATORY: No shortness of breath, cough or sputum.  GASTROINTESTINAL: No anorexia, nausea, vomiting or diarrhea. No abdominal pain or blood.  GENITOURINARY: No burning on urination, no polyuria NEUROLOGICAL: No headache, dizziness, syncope, paralysis, ataxia, numbness or tingling in the extremities. No change in bowel or bladder control.  MUSCULOSKELETAL: No muscle, back pain, joint pain or stiffness.  LYMPHATICS: No enlarged nodes. No history of splenectomy.  PSYCHIATRIC: No history of depression or anxiety.  ENDOCRINOLOGIC: No reports of sweating, cold or heat intolerance. No polyuria or polydipsia.  [name]   Physical Examination There were no vitals filed for this visit. There were no vitals filed for this visit.  Gen: resting comfortably, no acute distress HEENT: no scleral icterus, pupils equal round and reactive, no palptable cervical adenopathy,  CV Resp: Clear to auscultation bilaterally GI: abdomen is soft, non-tender, non-distended, normal bowel sounds, no hepatosplenomegaly MSK: extremities are warm, no edema.  Skin: warm, no rash Neuro:  no focal deficits Psych: appropriate affect   Diagnostic Studies 03/2016 cath  There is severe left ventricular systolic dysfunction.  LV end diastolic pressure is mildly elevated.  The left ventricular ejection fraction is 25-35% by visual estimate.  1. Normal coronary anatomy 2. Severe LV dysfunction- global 3. Mildly elevated LVEDP  Plan: optimize medical therapy for  CHF.  03/2016 echo Study Conclusions  - Left ventricle: The cavity size was normal. There was mild focal basal hypertrophy of the septum. Systolic function was mildly to moderately reduced. The estimated ejection fraction was in the range of 40% to 45%. Diffuse hypokinesis. Doppler parameters are consistent with abnormal left ventricular relaxation (grade 1 diastolic dysfunction). - Mitral valve: There was mild regurgitation. - Left atrium: The atrium was mildly dilated. - Pulmonary arteries: Systolic pressure was mildly increased.   04/2016 Cardiac MRI IMPRESSION: 1. Moderately dilated left ventricle with normal wall thickness and moderately decreased systolic function (LVEF = 33%) with diffuse hypokinesis.  There is no late gadolinium enhancement.  There is no evidence for infiltrative or inflammatory myocardial disease. 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  2. Normal  right ventricular size, thickness and systolic function (RVEF = 50%) with no wall motion abnormalities.  3. Moderately dilated left atrium (45 mm).  4. Normal size of the aortic root, ascending aorta and pulmonary artery.  5. Mild mitral and tricuspid regurgitation.  03/2016 Event monitor  Telemetry tracings show primarily sinus rhythm with occasional first degree and second degree Mobitz I Aurora Mask) block, occasional PVCs  Reported symptoms correlate with sinus rhtyhm with occasional PVCs   11/2017 [facility] Wenchebach block noted. NSVT up to 19 beats.   [facility] LVEF 30%    Assessment and Plan  1. Chronic systolic HF - nearly 2 years since our last visit, he had followed with [facility] hospital and is reestablishing - last LVEF 30%. 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 WIll have him establish in our device clinic - d/c losartan, start entresto 24/26mg  bid. Lower aldactone to 25mg  daily. Continue  lasix 40mg  in AM and 20mg  in PM  2. OSA screen - signs and symptoms of OSA. - he will discuss with his PCP having a sleep study through the [place] system      Antoine Poche, M.D.

## 2018-06-29 ENCOUNTER — Telehealth: Payer: Self-pay

## 2018-06-29 NOTE — Telephone Encounter (Signed)
Attempted call to the [place] at [phone] and left message for device clinic to release patient from [place] clinic as requested in [facility] website.  Left call back number for questions.

## 2018-06-29 NOTE — Telephone Encounter (Signed)
Referred to ICM clinic by Dr Allred.   Attempted call to patient for ICM intro and left message for return call.   

## 2018-06-29 NOTE — Telephone Encounter (Signed)
Spoke with patient and agreeable to monthly follow up.  Advised monitor should be by bedside in order for it to automatically transmit a report during sleep hours of 12 midnight and 6 AM.   Provided ICM number and explained should call if experiencing any fluid symptoms such as weight gain, shortness of breath or extremity/abdominal swelling. 1st ICM remote transmission has not been scheduled due to waiting on transfer from [place] clinic to [facility] clinic.  Request was submitted 05/31/2018 via [facility] [employer] site.

## 2018-07-23 ENCOUNTER — Telehealth: Payer: Self-pay

## 2018-07-23 NOTE — Telephone Encounter (Signed)
Received voice mail message from [place] stating patients Auth [id number] is good to 10/27/2018. A new authorization will be need to be submitted in March.  Dalton Porter from [place] can be reached at [phone] ext [phone].   Routed to [facility] for review.

## 2018-07-23 NOTE — Telephone Encounter (Signed)
Received voice mail message from [place] stating [facility] [place] to [facility] device clinic.

## 2018-07-23 NOTE — Telephone Encounter (Signed)
Received call back from patient.  Advised the [facility] needs authorization from him to transfer to [facility].  He said he will call [facility] today.

## 2018-07-23 NOTE — Telephone Encounter (Signed)
Attempted patient call and left message to return call.  

## 2018-08-04 NOTE — Telephone Encounter (Signed)
lmov for [facility] to call to discuss.    Dalton Porter

## 2018-08-04 NOTE — Telephone Encounter (Signed)
lmov to call office for additional information.  No response .  Awaiting Return call

## 2018-08-10 ENCOUNTER — Ambulatory Visit (INDEPENDENT_AMBULATORY_CARE_PROVIDER_SITE_OTHER): Payer: Medicare Other

## 2018-08-10 DIAGNOSIS — I5022 Chronic systolic (congestive) heart failure: Secondary | ICD-10-CM | POA: Diagnosis not present

## 2018-08-10 DIAGNOSIS — Z95810 Presence of automatic (implantable) cardiac defibrillator: Secondary | ICD-10-CM | POA: Diagnosis not present

## 2018-08-11 ENCOUNTER — Telehealth: Payer: Self-pay

## 2018-08-11 NOTE — Progress Notes (Signed)
EPIC Encounter for ICM Monitoring  Patient Name: KLARK CLERC is a 63 y.o. male Date: 08/11/2018 Primary Care Physican: Frederich Chick., MD Primary Cardiologist: Branch Electrophysiologist: Allred Today's Weight:  unknown       1st ICM remote.  Attempted call to patient and unable to reach.  Left message to return call. Transmission reviewed.    Report: Thoracic impedance abnormal suggesting dryness but trending back to baseline.  Impedance decreased suggesting fluid from 07/29/2018 - 08/08/2018 with exception of few days at baseline.  Prescribed: Furosemide 20 mg Take 20 mg by mouth. Take 40 mg in the morning and 20 mg in the evening  Recommendations: Unable to reach.  Follow-up plan: ICM clinic phone appointment on 09/14/2018.      Copy of ICM check sent to Dr. Johney Frame.   3 month ICM trend: 08/10/2018    1 Year ICM trend:       Karie Soda, RN 08/11/2018 9:09 AM

## 2018-08-11 NOTE — Progress Notes (Signed)
Patient returned call and stated he is doing well but has noticed some lightheadedness in the last couple of days. Reviewed transmission and explained the report suggests he may be dehydrated and lightheadedness can be a symptom.  Advised to make sure he is drinking around 64 oz a day.  Advised in the last month he has had some days that he has retained fluid and advised to limit salt intake.  Baseline weight is 237 lbs.  Encouraged him to call for any fluid symptoms and next transmission 09/13/2018.

## 2018-08-11 NOTE — Telephone Encounter (Signed)
Remote ICM transmission received.  Attempted call to patient regarding ICM remote transmission and left message to return call   

## 2018-08-27 DIAGNOSIS — Z79899 Other long term (current) drug therapy: Secondary | ICD-10-CM | POA: Diagnosis not present

## 2018-08-27 DIAGNOSIS — I639 Cerebral infarction, unspecified: Secondary | ICD-10-CM | POA: Diagnosis not present

## 2018-08-27 DIAGNOSIS — I6502 Occlusion and stenosis of left vertebral artery: Secondary | ICD-10-CM | POA: Diagnosis not present

## 2018-08-27 DIAGNOSIS — E119 Type 2 diabetes mellitus without complications: Secondary | ICD-10-CM | POA: Diagnosis not present

## 2018-08-27 DIAGNOSIS — Z7984 Long term (current) use of oral hypoglycemic drugs: Secondary | ICD-10-CM | POA: Diagnosis not present

## 2018-08-27 DIAGNOSIS — I509 Heart failure, unspecified: Secondary | ICD-10-CM | POA: Diagnosis not present

## 2018-08-27 DIAGNOSIS — Z7982 Long term (current) use of aspirin: Secondary | ICD-10-CM | POA: Diagnosis not present

## 2018-08-27 DIAGNOSIS — Z885 Allergy status to narcotic agent status: Secondary | ICD-10-CM | POA: Diagnosis not present

## 2018-08-27 DIAGNOSIS — R42 Dizziness and giddiness: Secondary | ICD-10-CM | POA: Diagnosis not present

## 2018-08-27 DIAGNOSIS — I6522 Occlusion and stenosis of left carotid artery: Secondary | ICD-10-CM | POA: Diagnosis not present

## 2018-08-27 DIAGNOSIS — I252 Old myocardial infarction: Secondary | ICD-10-CM | POA: Diagnosis not present

## 2018-08-30 ENCOUNTER — Ambulatory Visit (INDEPENDENT_AMBULATORY_CARE_PROVIDER_SITE_OTHER): Payer: Medicare Other | Admitting: *Deleted

## 2018-08-30 DIAGNOSIS — I5022 Chronic systolic (congestive) heart failure: Secondary | ICD-10-CM | POA: Diagnosis not present

## 2018-08-30 LAB — CUP PACEART REMOTE DEVICE CHECK
Battery Remaining Longevity: 109 mo
Battery Remaining Percentage: 94 %
Battery Voltage: 3.19 V
Brady Statistic RV Percent Paced: 6.1 %
Date Time Interrogation Session: 20200302180145
HIGH POWER IMPEDANCE MEASURED VALUE: 66 Ohm
HIGH POWER IMPEDANCE MEASURED VALUE: 66 Ohm
Lead Channel Impedance Value: 390 Ohm
Lead Channel Pacing Threshold Amplitude: 0.75 V
Lead Channel Pacing Threshold Pulse Width: 0.5 ms
Lead Channel Setting Pacing Amplitude: 2.5 V
Lead Channel Setting Pacing Pulse Width: 0.5 ms
MDC IDC LEAD IMPLANT DT: 20190730
MDC IDC LEAD LOCATION: 753860
MDC IDC MSMT LEADCHNL RV SENSING INTR AMPL: 11.5 mV
MDC IDC PG IMPLANT DT: 20190730
MDC IDC SET LEADCHNL RV SENSING SENSITIVITY: 0.5 mV
Pulse Gen Serial Number: 9839597

## 2018-09-06 ENCOUNTER — Encounter: Payer: Self-pay | Admitting: Cardiology

## 2018-09-06 NOTE — Progress Notes (Signed)
Remote ICD transmission.   

## 2018-09-13 ENCOUNTER — Other Ambulatory Visit: Payer: Self-pay | Admitting: Cardiology

## 2018-09-13 MED ORDER — SACUBITRIL-VALSARTAN 24-26 MG PO TABS: 1.0000 | ORAL_TABLET | Freq: Two times a day (BID) | ORAL | 2 refills | Status: DC

## 2018-09-13 NOTE — Telephone Encounter (Signed)
° °  1. Which medications need to be refilled? (please list name of each medication and dose if known)    sacubitril-valsartan (ENTRESTO) 24-26    2. Which pharmacy/location (including street and city if local pharmacy) is medication to be sent to?  [facility] [place] [place]   3. Do they need a 30 day or 90 day supply?   Please take out [facility] pharmacy .

## 2018-09-14 ENCOUNTER — Telehealth: Payer: Self-pay

## 2018-09-14 ENCOUNTER — Ambulatory Visit (INDEPENDENT_AMBULATORY_CARE_PROVIDER_SITE_OTHER): Payer: Medicare Other

## 2018-09-14 ENCOUNTER — Other Ambulatory Visit: Payer: Self-pay

## 2018-09-14 DIAGNOSIS — Z95810 Presence of automatic (implantable) cardiac defibrillator: Secondary | ICD-10-CM | POA: Diagnosis not present

## 2018-09-14 DIAGNOSIS — I5022 Chronic systolic (congestive) heart failure: Secondary | ICD-10-CM | POA: Diagnosis not present

## 2018-09-14 NOTE — Telephone Encounter (Signed)
Left message for patient to remind of missed remote transmission.  

## 2018-09-15 NOTE — Progress Notes (Signed)
EPIC Encounter for ICM Monitoring  Patient Name: Dalton Porter is a 63 y.o. male Date: 09/15/2018 Primary Care Physican: Frederich Chick., MD Primary Cardiologist: Branch Electrophysiologist: Allred Baseline Weight:  237 lbs 09/15/2018 Weight: unknown                                                           Attempted call to patient and unable to reach.  Left message to return call. Transmission reviewed.    Report: Thoracic impedance abnormal suggesting dryness but trending back to baseline.   Prescribed: Furosemide 20 mg take 2 tablets (40 mg total) in the morning and 20 mg in the evening  Recommendations: Unable to reach.  Follow-up plan: ICM clinic phone appointment on 10/18/2018.      Copy of ICM check sent to Dr. Johney Frame.  3 month ICM trend: 09/14/2018    1 Year ICM trend:       Karie Soda, RN 09/15/2018 11:01 AM

## 2018-10-18 ENCOUNTER — Ambulatory Visit (INDEPENDENT_AMBULATORY_CARE_PROVIDER_SITE_OTHER): Payer: No Typology Code available for payment source

## 2018-10-18 ENCOUNTER — Other Ambulatory Visit: Payer: Self-pay

## 2018-10-18 DIAGNOSIS — I5022 Chronic systolic (congestive) heart failure: Secondary | ICD-10-CM | POA: Diagnosis not present

## 2018-10-18 DIAGNOSIS — Z95810 Presence of automatic (implantable) cardiac defibrillator: Secondary | ICD-10-CM

## 2018-10-19 NOTE — Progress Notes (Signed)
EPIC Encounter for ICM Monitoring  Patient Name: Dalton Porter is a 63 y.o. male Date: 10/19/2018 Primary Care Physican: Frederich Chick., MD Primary Cardiologist:Branch Electrophysiologist:Allred Baseline Weight: 237 -239 lbs 10/19/2018 Weight: 237 lbs   Heart failure questions reviewed and pt asymptomatic.  Reports he is feeling well.   [place] Thoracic impedance normal but was decreased suggesting fluid accumulation from 10/13/2018 -10/17/2018.  Prescribed: Furosemide20 mgtake 2 tablets (40 mg total) in the morning and 20 mg in the evening  Recommendations: No changes and encouraged to call for fluid symptoms.   Follow-up plan: ICM clinic phone appointment on6/07/2018.   Copy of ICM check sent to Dr.Allred.  3 month ICM trend: 10/18/2018    1 Year ICM trend:       Karie Soda, RN 10/19/2018 4:02 PM

## 2018-11-16 ENCOUNTER — Other Ambulatory Visit: Payer: Self-pay | Admitting: Cardiology

## 2018-11-16 MED ORDER — METOPROLOL SUCCINATE ER 25 MG PO TB24: 12.5000 mg | ORAL_TABLET | Freq: Every day | ORAL | 0 refills | Status: DC

## 2018-11-16 MED ORDER — FUROSEMIDE 20 MG PO TABS: 40.0000 mg | ORAL_TABLET | ORAL | 0 refills | Status: DC

## 2018-11-16 NOTE — Telephone Encounter (Signed)
° ° °  1. Which medications need to be refilled? (please list name of each medication and dose if known)  Toprol 25mg  - Lasix 20 mg   2. Which pharmacy/location (including street and city if local pharmacy) is medication to [facility] sent to?[facility] [place] [place]  3. Do they need a 30 day or 90 day supply?

## 2018-11-29 ENCOUNTER — Ambulatory Visit (INDEPENDENT_AMBULATORY_CARE_PROVIDER_SITE_OTHER): Payer: Medicare Other | Admitting: *Deleted

## 2018-11-29 DIAGNOSIS — I5022 Chronic systolic (congestive) heart failure: Secondary | ICD-10-CM

## 2018-11-29 DIAGNOSIS — I428 Other cardiomyopathies: Secondary | ICD-10-CM

## 2018-11-30 ENCOUNTER — Telehealth: Payer: Self-pay

## 2018-11-30 ENCOUNTER — Ambulatory Visit (INDEPENDENT_AMBULATORY_CARE_PROVIDER_SITE_OTHER): Payer: Medicare Other

## 2018-11-30 DIAGNOSIS — I5022 Chronic systolic (congestive) heart failure: Secondary | ICD-10-CM

## 2018-11-30 DIAGNOSIS — Z95810 Presence of automatic (implantable) cardiac defibrillator: Secondary | ICD-10-CM | POA: Diagnosis not present

## 2018-11-30 LAB — CUP PACEART REMOTE DEVICE CHECK
Date Time Interrogation Session: 20200602091845
Implantable Lead Implant Date: 20190730
Implantable Lead Location: 753860
Implantable Pulse Generator Implant Date: 20190730
Pulse Gen Serial Number: 9839597

## 2018-11-30 NOTE — Progress Notes (Signed)
EPIC Encounter for ICM Monitoring  Patient Name: Dalton Porter is a 63 y.o. male Date: 11/30/2018 Primary Care Physican: Frederich Chick., MD Primary Cardiologist:Branch Electrophysiologist:Allred BaselineWeight: 237 -239 lbs 10/19/2018 Weight:237 lbs 11/30/2018 Weight: 238 lbs   Heart failure questions reviewed and pt asymptomatic.  Reports he is feeling well.   He has noticed urine output has increased for the last week.  He does not think his diet is high in salt and fluid intake is <64 oz.  Highest weight in last week was 239 and today was 238 lbs.  [place] Thoracic impedance abnormal suggesting fluid accumulation since 11/20/2018 with worsening on 11/26/2018.  Prescribed: Furosemide20 mgtake2 tablets (40 mgtotal)in the morning and 20 mg in the evening.  He reports taking Spironolactone 25 mg daily that is prescribed by [place] but is not a new prescription.  Labs: 10/21/2017 Creatinine 0.96, BUN 16, Potassium 4.1, Sodium 138, GFR 80 A complete set of results can be found in Results Review.  Recommendations: Advised to take Furosemide 3 tablets 60 mg every morning and 3 tablets every evening x 2 days.  After 2nd day return to prescribed dosage.   Follow-up plan: ICM clinic phone appointment on6/10/2018 (manual send) to recheck fluid levels.  Advised to call the office to schedule visit with Dr Wyline Mood since last visit was 05/07/2018 (was instructed to f/u at 6 weeks).  Copy of ICM check sent to Dr.Allred and phone note sent to Dr Wyline Mood for review and recommendations if needed.  3 month ICM trend: 11/29/2018    1 Year ICM trend:       Karie Soda, RN 11/30/2018 8:53 AM

## 2018-11-30 NOTE — Telephone Encounter (Signed)
ICM call to patient.  Reviewed transmission results which suggests he has fluid accumulation.   SX:  Asymptomatic. Weight stable at 238-239 lbs for the last week.  He reports less urine output than normal in the last week.     DIET: He does not think his diet is high in salt and fluid intake is <64 oz.  Highest weight in last week was 239 and today was 238 lbs.  REPORT: Corvue Thoracic impedance abnormal suggesting fluid accumulation since 11/20/2018 with worsening on 11/26/2018.  MEDS: Takes Furosemide20 mgtake2 tablets (40 mgtotal)in the morning and 20 mg in the evening.   He takes Spironolactone 25 mg daily that is prescribed by [place] but is not listed in Epic med list.  This is not a new prescription.  Labs: 10/21/2017 Creatinine 0.96, BUN 16, Potassium 4.1, Sodium 138, GFR 80  Recommendations: Advised to take Furosemide 3 tablets 60 mg every morning and 3 tablets every evening x 2 days.  After 2nd day return to prescribed dosage.  Advised to call Dr Verna Czech office for OV since he was last seen 04/2018.  Next transmission 12/03/2018 to recheck fluid levels.    Forwarded to Dr Wyline Mood for further recommendations if needed.   3 month ICM trend: 11/29/2018

## 2018-12-03 ENCOUNTER — Telehealth: Payer: Self-pay

## 2018-12-03 NOTE — Telephone Encounter (Signed)
Left message for patient to remind of missed remote transmission.  

## 2018-12-03 NOTE — Telephone Encounter (Signed)
ICM remote transmission scheduled 6/5 to recheck fluid levels.

## 2018-12-06 ENCOUNTER — Ambulatory Visit (INDEPENDENT_AMBULATORY_CARE_PROVIDER_SITE_OTHER): Payer: Non-veteran care

## 2018-12-06 ENCOUNTER — Telehealth: Payer: Self-pay | Admitting: Cardiology

## 2018-12-06 DIAGNOSIS — I5022 Chronic systolic (congestive) heart failure: Secondary | ICD-10-CM

## 2018-12-06 DIAGNOSIS — Z95810 Presence of automatic (implantable) cardiac defibrillator: Secondary | ICD-10-CM

## 2018-12-07 ENCOUNTER — Encounter: Payer: Self-pay | Admitting: Cardiology

## 2018-12-07 NOTE — Progress Notes (Signed)
Remote ICD transmission.   

## 2018-12-10 NOTE — Progress Notes (Signed)
EPIC Encounter for ICM Monitoring  Patient Name: Dalton Porter is a 63 y.o. male Date: 12/10/2018 Primary Care Physican: Sherald Hess., MD Primary Cardiologist:Branch Electrophysiologist:Allred BaselineWeight: 263 -239lbs 4/21/2020Weight:237 lbs 11/30/2018 Weight: 238 lbs   Attempted call to patient and unable to reach.  Left message to return call.  CorvueThoracic impedancereturned to normalsince 11/30/2018 remote transmission and after taking extra Furosemide x 2 days  Prescribed: Furosemide20 mgtake2 tablets (40 mgtotal)in the morning and 20 mg in the evening.  He reports taking Spironolactone 25 mg daily that is prescribed by [place] but is not a new prescription.  Labs: 10/21/2017 Creatinine 0.96, BUN 16, Potassium 4.1, Sodium 138, GFR 80 A complete set of results can be found in Results Review.  Recommendations: Unable to reach.    Follow-up plan: ICM clinic phone appointment on7/12/2018.   Patient needs to schedule visit with Dr Harl Bowie (last visit was 05/07/2018 and was instructed to f/u at 6 weeks).  Copy of ICM check sent to [facility]  Direct Trend View through 12/04/2018    Rosalene Billings, RN 12/10/2018 9:21 AM

## 2018-12-16 ENCOUNTER — Other Ambulatory Visit: Payer: Self-pay | Admitting: *Deleted

## 2018-12-16 MED ORDER — FUROSEMIDE 20 MG PO TABS: 40.0000 mg | ORAL_TABLET | ORAL | 0 refills | Status: DC

## 2019-01-05 NOTE — Progress Notes (Signed)
No ICM remote transmission received for 01/04/2019 and next ICM transmission scheduled for 01/31/2019.   

## 2019-01-18 NOTE — Patient Instructions (Addendum)
Medication Instructions:   Your physician recommends that you continue on your current medications as directed. Please refer to the Current Medication list given to you today.  Labwork:  NONE-need lab work from the [facility]  Testing/Procedures:  NONE  Follow-Up:  Your physician recommends that you schedule a follow-up appointment in: 6 months. You will receive a reminder letter in the mail in about 4 months reminding you to call and schedule your appointment. If you don't receive this letter, please contact our office.  Any Other Special Instructions Will Be Listed Below (If Applicable).  Please have lab work done at the [facility] sent to our office.  If you need a refill on your cardiac medications before your next appointment, please call your pharmacy.   Lifestyle Modifications to Prevent and Treat Heart Disease  -Recommend heart healthy/Mediterranean diet, with whole grains, fruits, vegetable, fish, lean meats, nuts, and olive oil.  -Limit salt. -Recommend moderate walking, 3-5 times/week for 30-50 minutes each session. Aim for at least 150 minutes.week. Goal should be pace of 3 miles/hours, or walking 1.5 miles in 30 minutes -Recommend avoidance of tobacco products. Avoid excess alcohol. -Keep blood pressure well controlled, ideally less than 130/80.

## 2019-01-18 NOTE — Progress Notes (Signed)
Cardiology Office Note:    Date:  01/19/2019   ID:  Dalton Porter, DOB 1956-02-22, MRN [id number]  PCP:  Dalton Hess., MD  Cardiologist:  Dalton Dolly, MD  Electrophysiology: Dalton Grayer, MD  Referring MD: Dalton Hess., MD   Chief Complaint  Patient presents with  . Follow-up    Non-ischemic cardiomyopathy    History of Present Illness:    Dalton Porter is a 63 y.o. male with a past medical history significant for hypertension, nonischemic cardiomyopathy with EF 30%, ICD placed 12/2017, diabetes type 2, second-degree type I heart block, PTSD, CVA at [facility] 07/2018.  The patient was admitted to the hospital for chest pain in 03/2016.  Cardiac cath showed normal coronary arteries.  Echo in 03/2016 showed EF 40-45%.  Cardiac MRI in 04/2016 without evidence of infiltrative or inflammatory process.  LVEF by MRI 33% with diffuse hypokinesis.  Medical therapy was limited by soft blood pressures and Wenchebach heart block.  The patient was followed by cardiology at the [facility] for a time and then came back to [facility] heart care. Echocardiogram done at the [place] in 12/2017 showed LVEF 30% with grade 2 diastolic dysfunction.  ICD was placed 01/26/2018 at [facility] hospital in [name].  This is now followed by Dr. Rayann Heman.  Dalton Porter is here today for 6 month follow up. He had an ischemic CVA in 07/2018. His symptom was a bad headache. He developed a balance issue after the headache. This is followed by his PCP. His aspirin was switched to 324 mg.   Cardiac wise he feels well. Has has unchanged shortness of breath with exertion like walking about 1/2-1 block or climbing more than one flight of stairs. No recent changes. He has occ mild chest discomfort if very hot outside and walking a distance, not very often. He has dizziness if he gets up too quickly or if he walks a longer distance. No orthopnea, PND or edema. No palpitations. He feels much better since being on the [facility] as far as  breathing and activity tolerance.   Patient works at [employer], about 4 hours per day. 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 He has to take more breaks when it is very hot out.    Past Medical History:  Diagnosis Date  . Back pain, chronic   . Diabetes mellitus   . Nonischemic cardiomyopathy ([place])    a. EF 40% by echo in 2003 b. 03/2016: cath showing normal cors and EF of 25-35%, EF 40-45% by echo with diffuse hypokinesis.    Past Surgical History:  Procedure Laterality Date  . ANKLE SURGERY    . APPENDECTOMY    . BACK SURGERY     x3  . CARDIAC CATHETERIZATION  2002  . CARDIAC CATHETERIZATION N/A 04/08/2016   Procedure: Left Heart Cath and Coronary Angiography;  Surgeon: Dalton M Martinique, MD;  Location: [facility];  Service: Cardiovascular;  Laterality: N/A;  . CHOLECYSTECTOMY    . ELBOW SURGERY    . ICD IMPLANT  01/26/2018   SJM Fortify Assura VR ICD implanted by Dr [name] for primary prevention of sudden death  . THUMB FUSION      Current Medications: Current Meds  Medication Sig  . aspirin EC 81 MG tablet Take 81 mg by mouth daily.  . Cholecalciferol (VITAMIN D3) 5000 units CAPS Take 1 capsule by mouth daily.  . furosemide (LASIX) 20 MG tablet Take 2 tablets (40  mg total) by mouth every morning. and 20 mg in the evening  . glipiZIDE (GLUCOTROL) 5 MG tablet Take 5 mg by mouth 2 (two) times daily before a meal.    . metFORMIN (GLUCOPHAGE) 500 MG tablet Take 500 mg by mouth 2 (two) times daily with a meal.    . metoprolol succinate (TOPROL XL) 25 MG 24 hr tablet Take 0.5 tablets (12.5 mg total) by mouth daily.  . sacubitril-valsartan (ENTRESTO) 24-26 MG Take 1 tablet by mouth 2 (two) times daily.  [name]. spironolactone (ALDACTONE) 25 MG tablet Take 25 mg by mouth daily. [facility] and [facility] reports on 11/30/2018 that he takes daily.  . TRAZODONE HCL PO Take 100 mg by mouth at bedtime.      Allergies:   Peanuts [nuts], Meat extract, and Codeine   Social  History   Socioeconomic History  . Marital status: Married    Spouse name: Not on file  . Number of children: Not on file  . Years of education: Not on file  . Highest education level: Not on file  Occupational History  . Not on file  Social Needs  . Financial resource strain: Not on file  . Food insecurity    Worry: Not on file    Inability: Not on file  . Transportation needs    Medical: Not on file    Non-medical: Not on file  Tobacco Use  . Smoking status: Never Smoker  . Smokeless tobacco: Never Used  Substance and Sexual Activity  . Alcohol use: No  . Drug use: No  . Sexual activity: Yes  Lifestyle  . Physical activity    Days per week: Not on file    Minutes per session: Not on file  . Stress: Not on file  Relationships  . Social Musicianconnections    Talks on phone: Not on file    Gets together: Not on file    Attends religious service: Not on file    Active member of club or organization: Not on file    Attends meetings of clubs or organizations: Not on file    Relationship status: Not on file  Other Topics Concern  . 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 ROS:   Please see the history of present illness.     All other systems reviewed and are negative.  EKGs/Labs/Other Studies Reviewed:    The following studies were reviewed today: 03/2016 cath  There is severe left ventricular systolic dysfunction.  LV end diastolic pressure is mildly elevated.  The left ventricular ejection fraction is 25-35% by visual estimate.  1. Normal coronary anatomy 2. Severe LV dysfunction- global 3. Mildly elevated LVEDP  Plan: optimize medical therapy for CHF.  03/2016 echo Study  Conclusions  - Left ventricle: The cavity size was normal. There was mild focal basal hypertrophy of the septum. Systolic function was mildly to moderately reduced. The estimated ejection fraction was in the range of 40% to 45%. Diffuse hypokinesis. Doppler parameters are consistent with abnormal left ventricular relaxation (grade 1 diastolic dysfunction). - Mitral valve: There was mild regurgitation. - Left atrium: The atrium was mildly dilated. - Pulmonary arteries: Systolic pressure  was mildly increased.   04/2016 Cardiac MRI IMPRESSION: 1. Moderately dilated left ventricle with normal wall thickness and moderately decreased systolic function (LVEF = 33%) with diffuse hypokinesis.  There is no late gadolinium enhancement.  There is no evidence for infiltrative or inflammatory myocardial disease. 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  2. Normal right ventricular size, thickness and systolic function (RVEF = 50%) with no wall motion abnormalities.  3. Moderately dilated left atrium (45 mm).  4. Normal size of the aortic root, ascending aorta and pulmonary artery.  5. Mild mitral and tricuspid regurgitation.  03/2016 Event monitor  Telemetry tracings show primarily sinus rhythm with occasional first degree and second degree Mobitz I Aurora Mask(Wenchebach) block, occasional PVCs  Reported symptoms correlate with sinus rhtyhm with occasional PVCs   11/2017 [facility] Ziopatch Wenchebach block noted. NSVT up to 19 beats.   [facility] LVEF 30%   EKG:  EKG is not ordered today.   Recent Labs: No results found for requested labs within last 8760 hours.   Recent Lipid Panel    Component Value Date/Time   CHOL 153 04/08/2016 0544   TRIG 224 (H) 04/08/2016 0544   HDL 32 (L) 04/08/2016 0544   CHOLHDL 4.8 04/08/2016 0544   VLDL 45 (H) 04/08/2016 0544   LDLCALC 76 04/08/2016 0544    Physical Exam:    VS:   BP 110/62   Pulse 65   Temp 98 F (36.7 C)   Ht 5\' 11"  (1.803 m)   Wt 248 lb (112.5 kg)   SpO2 96%   BMI 34.59 kg/m     Wt Readings from Last 3 Encounters:  01/19/19 248 lb (112.5 kg)  05/31/18 259 lb (117.5 kg)  05/07/18 239 lb 6.4 oz (108.6 kg)     Physical Exam  Constitutional: He is oriented to person, place, and time. He appears well-developed and well-nourished. No distress.  HENT:  Head: Normocephalic and atraumatic.  Neck: Normal range of motion. Neck supple. No JVD present.  Cardiovascular: Normal rate, regular rhythm, normal heart sounds and intact distal pulses. Exam reveals no gallop and no friction rub.  No murmur heard. Pulmonary/Chest: Effort normal and breath sounds normal. No respiratory distress. He has no wheezes. He has no rales.  Abdominal: Soft. Bowel sounds are normal.  Central obesity  Musculoskeletal: Normal range of motion.        General: No edema.  Neurological: He is alert and oriented to person, place, and time.  Skin: Skin is warm and dry.  Psychiatric: He has a normal mood and affect. His behavior is normal. Judgment and thought content normal.  Vitals reviewed.    ASSESSMENT:    1. Chronic systolic heart failure (HCC)   2. ICD (implantable cardioverter-defibrillator) in place   3. Type 2 diabetes mellitus without complication, without long-term current use of insulin (HCC)   4. History of stroke    PLAN:    In order of problems listed above:  Chronic systolic heart failure -Normal coronary arteries by cardiac cath in 2017. -[facility]  Patient now has ICD.  Medical therapy limited by soft blood pressure and second-degree AV block type I.  Patient was started on low-dose Entresto in 04/2018.  Aldactone was lowered to 25 mg daily.  He was continued on Lasix 40 mg in the morning and 20 mg in the evening. -He needs renal function and potassium monitored [facility]. [facility]  [facility]. He will have labs sent to our office.   S/P St Jude ICD placed at the [place] in 12/2017 -Now followed by Dr. Johney Frame.  V paced 6.9% by last interrogation 11/29/2018.   Diabetes type 2: A1c 6.0 per pt. Well controlled.   Suspicion for obstructive sleep apnea -Previously noted signs and symptoms of OSA. Pt says his wife says he snores if he is on his back, not on his side. She does not notice that he stops breathing.  -Patient discussed sleep study with his PCP at the [place]. It has been put on hold due to COVID. He will bring it up again at the [place].   History of stroke in 07/2018: Management per PCP. On full dose aspirin now. ?if needs to be continued beyond 6 months or back to 81 mg. Pt is not on a statin. I will forward this note to the [place] for consideration of a statin.    Medication Adjustments/Labs and Tests Ordered: Current medicines are reviewed at length with the patient today.  Concerns regarding medicines are outlined above. Labs and tests ordered and medication changes are outlined in the patient instructions below:  Patient Instructions  Medication Instructions:   Your physician recommends that you continue on your current medications as directed. Please refer to the Current Medication list given to you today.  Labwork:  NONE-need lab work from the [facility]  Testing/Procedures:  NONE  Follow-Up:  Your physician recommends that you schedule a follow-up appointment in: 6 months. You will receive a reminder letter in the mail in about 4 months reminding you to call and schedule your appointment. If you don't receive this letter, please contact our office.  Any Other Special Instructions Will Be Listed Below (If Applicable).  Please have lab work done at the [facility] sent to our office.  If you need a refill on your cardiac medications before your next appointment, please call your pharmacy.   Lifestyle Modifications to Prevent and Treat Heart Disease  -Recommend  heart healthy/Mediterranean diet, with whole grains, fruits, vegetable, fish, lean meats, nuts, and olive oil.  -Limit salt. -Recommend moderate walking, 3-5 times/week for 30-50 minutes each session. Aim for at least 150 minutes.week. Goal should be pace of 3 miles/hours, or walking 1.5 miles in 30 minutes -Recommend avoidance of tobacco products. Avoid excess alcohol. -Keep blood pressure well controlled, ideally less than 130/80.      Signed, Berton Bon, NP  01/19/2019 1:17 PM    [facility]

## 2019-01-19 ENCOUNTER — Other Ambulatory Visit: Payer: Self-pay

## 2019-01-19 ENCOUNTER — Ambulatory Visit (INDEPENDENT_AMBULATORY_CARE_PROVIDER_SITE_OTHER): Payer: Medicare Other | Admitting: Cardiology

## 2019-01-19 ENCOUNTER — Encounter: Payer: Self-pay | Admitting: Cardiology

## 2019-01-19 ENCOUNTER — Encounter: Payer: Self-pay | Admitting: *Deleted

## 2019-01-19 VITALS — BP 110/62 | HR 65 | Temp 98.0°F | Ht 71.0 in | Wt 248.0 lb

## 2019-01-19 DIAGNOSIS — Z8673 Personal history of transient ischemic attack (TIA), and cerebral infarction without residual deficits: Secondary | ICD-10-CM

## 2019-01-19 DIAGNOSIS — Z95810 Presence of automatic (implantable) cardiac defibrillator: Secondary | ICD-10-CM | POA: Diagnosis not present

## 2019-01-19 DIAGNOSIS — E119 Type 2 diabetes mellitus without complications: Secondary | ICD-10-CM

## 2019-01-19 DIAGNOSIS — I5022 Chronic systolic (congestive) heart failure: Secondary | ICD-10-CM

## 2019-01-19 NOTE — Addendum Note (Signed)
Addended by: Merlene Laughter on: 01/19/2019 02:14 PM   Modules accepted: Orders

## 2019-01-31 ENCOUNTER — Ambulatory Visit (INDEPENDENT_AMBULATORY_CARE_PROVIDER_SITE_OTHER): Payer: No Typology Code available for payment source

## 2019-01-31 DIAGNOSIS — I5022 Chronic systolic (congestive) heart failure: Secondary | ICD-10-CM

## 2019-01-31 DIAGNOSIS — Z95810 Presence of automatic (implantable) cardiac defibrillator: Secondary | ICD-10-CM

## 2019-02-02 ENCOUNTER — Telehealth: Payer: Self-pay

## 2019-02-02 NOTE — Telephone Encounter (Signed)
Remote ICM transmission received.  Attempted call to patient regarding ICM remote transmission and left message, per DPR, to return call.    

## 2019-02-02 NOTE — Progress Notes (Signed)
EPIC Encounter for ICM Monitoring  Patient Name: Dalton Porter is a 63 y.o. male Date: 02/02/2019 Primary Care Physican: [facility] Primary Cardiologist:Branch Electrophysiologist:Allred BaselineWeight: 237 -239lbs 4/21/2020Weight:237 lbs 11/30/2018 Weight:238lbs   Attempted call to patient and unable to reach.  Left message to return call.  CorvueThoracic impedancenormalbut suggestive of possible fluid accumulation 7/3 - 7/5, 7/14 - 7/20, 7/21 - 7/27, 8/1 - 8/2.  Prescribed: Furosemide20 mgtake2 tablets (40 mgtotal)in the morning and 20 mg in the evening. He reports taking Spironolactone 25 mg daily that is prescribed by [place] but is not a new prescription.  Labs: 04/24/2019Creatinine 0.96, BUN16, Potassium4.1, Sodium138, GFR80 A complete set of results can be found in Results Review.  Recommendations: Unable to reach.    Follow-up plan: ICM clinic phone appointment on9/01/2019.    Copy of ICM check sent to Dr.Allred   3 month ICM trend: 01/31/2019    1 Year ICM trend:       Rosalene Billings, RN 02/02/2019 3:28 PM

## 2019-02-07 NOTE — Progress Notes (Signed)
Returned patient call as requested by voice mail message.  He reports feeling well and denied any fluid symptoms.  Weight stable at 238 lbs - 240 lbs. Discussed transmission results and encouraged to review diet for hidden salt and limit to 2000 mg daily.  He said he will watch diet more closely.  No changes and encouraged to call if experiencing any fluid symptoms.  Next remote transmission 03/08/2019.

## 2019-02-28 ENCOUNTER — Ambulatory Visit (INDEPENDENT_AMBULATORY_CARE_PROVIDER_SITE_OTHER): Payer: No Typology Code available for payment source | Admitting: *Deleted

## 2019-02-28 DIAGNOSIS — I428 Other cardiomyopathies: Secondary | ICD-10-CM | POA: Diagnosis not present

## 2019-03-01 LAB — CUP PACEART REMOTE DEVICE CHECK
Battery Remaining Longevity: 104 mo
Battery Remaining Percentage: 90 %
Battery Voltage: 3.14 V
Brady Statistic RV Percent Paced: 8.2 %
Date Time Interrogation Session: 20200831221257
HighPow Impedance: 68 Ohm
HighPow Impedance: 68 Ohm
Implantable Lead Implant Date: 20190730
Implantable Lead Location: 753860
Implantable Pulse Generator Implant Date: 20190730
Lead Channel Impedance Value: 350 Ohm
Lead Channel Pacing Threshold Amplitude: 0.75 V
Lead Channel Pacing Threshold Pulse Width: 0.5 ms
Lead Channel Sensing Intrinsic Amplitude: 11.5 mV
Lead Channel Setting Pacing Amplitude: 2.5 V
Lead Channel Setting Pacing Pulse Width: 0.5 ms
Lead Channel Setting Sensing Sensitivity: 0.5 mV
Pulse Gen Serial Number: 9839597

## 2019-03-08 ENCOUNTER — Ambulatory Visit (INDEPENDENT_AMBULATORY_CARE_PROVIDER_SITE_OTHER): Payer: Medicare Other

## 2019-03-08 DIAGNOSIS — Z95810 Presence of automatic (implantable) cardiac defibrillator: Secondary | ICD-10-CM

## 2019-03-08 DIAGNOSIS — I5022 Chronic systolic (congestive) heart failure: Secondary | ICD-10-CM

## 2019-03-09 ENCOUNTER — Encounter: Payer: Self-pay | Admitting: Cardiology

## 2019-03-09 LAB — CUP PACEART REMOTE DEVICE CHECK
Battery Remaining Longevity: 104 mo
Battery Remaining Percentage: 90 %
Battery Voltage: 3.14 V
Brady Statistic RV Percent Paced: 8.3 %
Date Time Interrogation Session: 20200908201556
HighPow Impedance: 78 Ohm
HighPow Impedance: 78 Ohm
Implantable Lead Implant Date: 20190730
Implantable Lead Location: 753860
Implantable Pulse Generator Implant Date: 20190730
Lead Channel Impedance Value: 360 Ohm
Lead Channel Pacing Threshold Amplitude: 0.75 V
Lead Channel Pacing Threshold Pulse Width: 0.5 ms
Lead Channel Sensing Intrinsic Amplitude: 11.5 mV
Lead Channel Setting Pacing Amplitude: 2.5 V
Lead Channel Setting Pacing Pulse Width: 0.5 ms
Lead Channel Setting Sensing Sensitivity: 0.5 mV
Pulse Gen Serial Number: 9839597

## 2019-03-09 NOTE — Progress Notes (Signed)
Remote ICD transmission.   

## 2019-03-11 ENCOUNTER — Telehealth: Payer: Self-pay

## 2019-03-11 NOTE — Telephone Encounter (Signed)
Remote ICM transmission received.  Attempted call to patient regarding ICM remote transmission and left message to return call   

## 2019-03-11 NOTE — Progress Notes (Signed)
EPIC Encounter for ICM Monitoring  Patient Name: Dalton Porter is a 63 y.o. male Date: 03/11/2019 Primary Care Physican: [facility], [place] Primary Cardiologist:Branch Electrophysiologist:Allred 11/30/2018 Weight:238lbs   Attempted call to patient and unable to reach. Left message to return call.  CorvueThoracic impedancenormalbut suggestive of possible fluid accumulation 8/25-8/31.  Prescribed: Furosemide20 mgtake2 tablets (40 mgtotal)in the morning and 20 mg in the evening. He reports taking Spironolactone 25 mg daily that is prescribed by [place] but is not a new prescription.  Labs: 04/24/2019Creatinine 0.96, BUN16, Potassium4.1, Sodium138, GFR80 A complete set of results can be found in Results Review.  Recommendations: Unable to reach.  Follow-up plan: ICM clinic phone appointment on 04/25/2019.91 device clinic remote transmission scheduled 05/30/2019.  OV with Dr Rayann Heman on 06/03/2019.  Copy of ICM check sent to Dr.Allred   3 month ICM trend: 03/08/2019    1 Year ICM trend:       Rosalene Billings, RN 03/11/2019 2:44 PM

## 2019-03-16 NOTE — Telephone Encounter (Signed)
Error

## 2019-03-24 ENCOUNTER — Telehealth: Payer: Self-pay | Admitting: Cardiology

## 2019-03-24 NOTE — Telephone Encounter (Signed)
Complaints of active chest pain now (4/10) - has had off/on in the past, but this time having nausea and pain in left shoulder lasting for several hours.  Does not have NTG.  Suggested he be evaluated in the ED at [facility].  He verbalized understanding.

## 2019-03-24 NOTE — Telephone Encounter (Signed)
Pt c/o of Chest Pain: 1. Are you having CP right now? Yes  2. Are you experiencing any other symptoms (ex. SOB, nausea, vomiting, sweating)? Nausea and pain in left shoulder  3. How long have you been experiencing CP? Several days   4. Is your CP continuous or coming and going? Yes  5. Have you taken Nitroglycerin? No    States for the last hour the pain continues . Scale of 1-10    4

## 2019-03-25 DIAGNOSIS — Z91018 Allergy to other foods: Secondary | ICD-10-CM | POA: Diagnosis not present

## 2019-03-25 DIAGNOSIS — R079 Chest pain, unspecified: Secondary | ICD-10-CM | POA: Diagnosis not present

## 2019-03-25 DIAGNOSIS — R0789 Other chest pain: Secondary | ICD-10-CM | POA: Diagnosis not present

## 2019-03-25 DIAGNOSIS — Z95810 Presence of automatic (implantable) cardiac defibrillator: Secondary | ICD-10-CM | POA: Diagnosis not present

## 2019-03-25 DIAGNOSIS — I509 Heart failure, unspecified: Secondary | ICD-10-CM | POA: Diagnosis not present

## 2019-03-25 DIAGNOSIS — E119 Type 2 diabetes mellitus without complications: Secondary | ICD-10-CM | POA: Diagnosis not present

## 2019-03-25 DIAGNOSIS — Z79899 Other long term (current) drug therapy: Secondary | ICD-10-CM | POA: Diagnosis not present

## 2019-03-25 DIAGNOSIS — R071 Chest pain on breathing: Secondary | ICD-10-CM | POA: Diagnosis not present

## 2019-03-25 DIAGNOSIS — R0902 Hypoxemia: Secondary | ICD-10-CM | POA: Diagnosis not present

## 2019-03-25 DIAGNOSIS — Z7984 Long term (current) use of oral hypoglycemic drugs: Secondary | ICD-10-CM | POA: Diagnosis not present

## 2019-03-25 DIAGNOSIS — Z885 Allergy status to narcotic agent status: Secondary | ICD-10-CM | POA: Diagnosis not present

## 2019-03-25 DIAGNOSIS — I110 Hypertensive heart disease with heart failure: Secondary | ICD-10-CM | POA: Diagnosis not present

## 2019-03-28 ENCOUNTER — Encounter: Payer: Self-pay | Admitting: Cardiology

## 2019-03-28 ENCOUNTER — Ambulatory Visit (INDEPENDENT_AMBULATORY_CARE_PROVIDER_SITE_OTHER): Payer: No Typology Code available for payment source | Admitting: Physician Assistant

## 2019-03-28 ENCOUNTER — Telehealth: Payer: Self-pay | Admitting: Cardiology

## 2019-03-28 ENCOUNTER — Encounter: Payer: Self-pay | Admitting: Physician Assistant

## 2019-03-28 ENCOUNTER — Encounter: Payer: Self-pay | Admitting: *Deleted

## 2019-03-28 ENCOUNTER — Other Ambulatory Visit: Payer: Self-pay

## 2019-03-28 VITALS — BP 112/62 | HR 82 | Temp 97.7°F | Ht 71.0 in | Wt 234.0 lb

## 2019-03-28 DIAGNOSIS — I10 Essential (primary) hypertension: Secondary | ICD-10-CM | POA: Diagnosis not present

## 2019-03-28 DIAGNOSIS — I208 Other forms of angina pectoris: Secondary | ICD-10-CM

## 2019-03-28 DIAGNOSIS — R079 Chest pain, unspecified: Secondary | ICD-10-CM

## 2019-03-28 DIAGNOSIS — Z8673 Personal history of transient ischemic attack (TIA), and cerebral infarction without residual deficits: Secondary | ICD-10-CM | POA: Diagnosis not present

## 2019-03-28 DIAGNOSIS — E1149 Type 2 diabetes mellitus with other diabetic neurological complication: Secondary | ICD-10-CM | POA: Diagnosis not present

## 2019-03-28 DIAGNOSIS — I428 Other cardiomyopathies: Secondary | ICD-10-CM

## 2019-03-28 NOTE — Patient Instructions (Signed)
Medication Instructions:  Your physician recommends that you continue on your current medications as directed. Please refer to the Current Medication list given to you today.  If you need a refill on your cardiac medications before your next appointment, please call your pharmacy.   Lab work: NONE  If you have labs (blood work) drawn today and your tests are completely normal, you will receive your results only by: [name] MyChart Message (if you have MyChart) OR . A paper copy in the mail If you have any lab test that is abnormal or we need to change your treatment, we will call you to review the results.  Testing/Procedures: Your physician has requested that you have an echocardiogram. Echocardiography is a painless test that uses sound waves to create images of your heart. It provides your doctor with information about the size and shape of your heart and how well your heart's chambers and valves are working. This procedure takes approximately one hour. There are no restrictions for this procedure.  Your physician has requested that you have a lexiscan myoview. For further information please visit [email or web address]. Please follow instruction sheet, as given.    Follow-Up: At [facility], you and your health needs are our priority.  As part of our continuing mission to provide you with exceptional heart care, we have created designated Provider Care Teams.  These Care Teams include your primary Cardiologist (physician) and Advanced Practice Providers (APPs -  Physician Assistants and Nurse Practitioners) who all work together to provide you with the care you need, when you need it. You will need a follow up appointment after test.  Please call our office 2 months in advance to schedule this appointment.  You may see Carlyle Dolly, MD or one of the following Advanced Practice Providers on your designated Care Team:   Bernerd Pho, PA-C [facility]) . Ermalinda Barrios, PA-C  ([place])  Any Other Special Instructions Will Be Listed Below (If Applicable). Thank you for choosing [facility]!

## 2019-03-28 NOTE — Telephone Encounter (Signed)
Was in [facility] this past Friday for chest pain and was told to contact our office first thing this morning to speak with office to see if he needs to be seen in office. Records have been requested

## 2019-03-28 NOTE — Progress Notes (Signed)
Cardiology Office Note    Date:  03/28/2019   ID:  Dalton Porter, DOB 08-22-1955, MRN [id number]  PCP:  [facility] [place]  Cardiologist: Carlyle Dolly, MD EPS: Thompson Grayer, MD  Chief Complaint  Patient presents with  . Follow-up    History of Present Illness:  Dalton Porter is a 63 y.o. male with history of chest pain,cath 2017 normal coronary arteries.NICM LVEF 40-45% with grade 2 DD echo 03/2018, ICD 01/26/18 [facility] now followed by Dr. Rayann Heman, DM2, CVA [facility] 07/2018. Corvue thoracic impedance normal but possible fluid accumulation through July and early august.  03/25/19 went to [facility] with chest pain. Troponins negative and sent home. All records reviewed. Labs stable, Crt 1.53. EKG NSR, low voltage-no change when compared to EKG 04/20/18.  Patient says he was having sharp shooting pain in chest off and on all last week going into neck and short of breath.Sometimes felt heavy. Says NTG helped it in ER. Had more Sat and awakened at 4 am today with sharp shooting pain lasting 4 min and eased spontaneously. Works as a Engineer, materials. Doesn't think he's strained himself. Not sore to touch. Worse with exertion. Walks about a mile daily. Defibrillator hasn't gone off but thinks his heart was skipping Friday.    Past Medical History:  Diagnosis Date  . Back pain, chronic   . Diabetes mellitus   . Nonischemic cardiomyopathy ([place])    a. EF 40% by echo in 2003 b. 03/2016: cath showing normal cors and EF of 25-35%, EF 40-45% by echo with diffuse hypokinesis.    Past Surgical History:  Procedure Laterality Date  . ANKLE SURGERY    . APPENDECTOMY    . BACK SURGERY     x3  . CARDIAC CATHETERIZATION  2002  . CARDIAC CATHETERIZATION N/A 04/08/2016   Procedure: Left Heart Cath and Coronary Angiography;  Surgeon: Peter M Martinique, MD;  Location: [facility];  Service: Cardiovascular;  Laterality: N/A;  . CHOLECYSTECTOMY    . ELBOW SURGERY    . ICD  IMPLANT  01/26/2018   SJM Fortify Assura VR ICD implanted by Dr [name] for primary prevention of sudden death  . THUMB FUSION      Current Medications: Current Meds  Medication Sig  . aspirin 325 MG tablet Take 325 mg by mouth daily.  [name] atorvastatin (LIPITOR) 40 MG tablet Take 40 mg by mouth daily.  . Cholecalciferol (VITAMIN D3) 5000 units CAPS Take 1 capsule by mouth daily.  . furosemide (LASIX) 20 MG tablet Take 2 tablets (40 mg total) by mouth every morning. and 20 mg in the evening  . glipiZIDE (GLUCOTROL) 5 MG tablet Take 5 mg by mouth 2 (two) times daily before a meal.    . metFORMIN (GLUCOPHAGE) 500 MG tablet Take 500 mg by mouth 2 (two) times daily with a meal.    . metoprolol succinate (TOPROL XL) 25 MG 24 hr tablet Take 0.5 tablets (12.5 mg total) by mouth daily.  . sacubitril-valsartan (ENTRESTO) 24-26 MG Take 1 tablet by mouth 2 (two) times daily.  [name] spironolactone (ALDACTONE) 25 MG tablet Take 25 mg by mouth daily. Prescribed by [facility] and pt reports on 11/30/2018 that he takes daily.  . TRAZODONE HCL PO Take 100 mg by mouth at bedtime.      Allergies:   Peanuts [nuts], Meat extract, and Codeine   Social History   Socioeconomic History  . Marital status: Married  Spouse name: Not on file  . Number of children: Not on file  . Years of education: Not on file  . Highest education level: Not on file  Occupational History  . Not on file  Social Needs  . Financial resource strain: Not on file  . Food insecurity    Worry: Not on file    Inability: Not on file  . Transportation needs    Medical: Not on file    Non-medical: Not on file  Tobacco Use  . Smoking status: Never Smoker  . Smokeless tobacco: Never Used  Substance and Sexual Activity  . Alcohol use: No  . Drug use: No  . Sexual activity: Yes  Lifestyle  . Physical activity    Days per week: Not on file    Minutes per session: Not on file  . Stress: Not on file  Relationships  . Social Wellsite geologist on phone: Not on file    Gets together: Not on file    Attends religious service: Not on file    Active member of club or organization: Not on file    Attends meetings of clubs or organizations: Not on file    Relationship status: Not on file  Other Topics Concern  . 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   ROS:   Please see the history of present illness.    ROS All other systems reviewed and are negative.   PHYSICAL EXAM:   VS:  BP 112/62   Pulse 82   Temp 97.7 F (36.5 C)   Ht 5\' 11"  (1.803 m)   Wt 234 lb (106.1 kg)   SpO2 97%   BMI 32.64 kg/m   Physical Exam  GEN: Obese, in no acute distress  Neck: no JVD, carotid bruits, or masses Cardiac:RRR; no murmurs, rubs, or gallops nontender to palpation Respiratory:  clear to auscultation bilaterally, normal work of breathing GI: soft, nontender, nondistended, + BS Ext: without cyanosis, clubbing, or edema, Good distal pulses bilaterally Neuro:  Alert and Oriented x 3 Psych: euthymic mood, full affect  Wt Readings from Last 3 Encounters:  03/28/19 234 lb (106.1 kg)  01/19/19 248 lb (112.5 kg)  05/31/18 259 lb (117.5 kg)      Studies/Labs Reviewed:   EKG:  EKG is not ordered today.  The ekg reviewed from [facility]19 [facility] demonstratesNSR, low voltage-no change when compared to EKG 04/20/18  Recent Labs: No results found for requested labs within last 8760 hours.   Lipid Panel    Component Value Date/Time   CHOL 153 04/08/2016 0544   TRIG 224 (H) 04/08/2016 0544   HDL 32 (L) 04/08/2016 0544   CHOLHDL 4.8 04/08/2016 0544   VLDL 45 (H) 04/08/2016 0544   LDLCALC 76 04/08/2016 0544    Additional studies/ records that were  reviewed today include:   Echo 2017Study Conclusions   - Left ventricle: The cavity size was normal. There was mild focal   basal hypertrophy of the septum. Systolic function was mildly to   moderately reduced. The estimated ejection fraction was in the   range of 40% to 45%. Diffuse hypokinesis. Doppler parameters  are   consistent with abnormal left ventricular relaxation (grade 1   diastolic dysfunction). - Mitral valve: There was mild regurgitation. - Left atrium: The atrium was mildly dilated. - Pulmonary arteries: Systolic pressure was mildly increased.     Cardiac cath 2017There is severe left ventricular systolic dysfunction.  LV end diastolic pressure is mildly elevated.  The left ventricular ejection fraction is 25-35% by visual estimate.   1. Normal coronary anatomy 2. Severe LV dysfunction- global 3. Mildly elevated LVEDP   Plan: optimize medical therapy for CHF.      ASSESSMENT:    1. Nonischemic cardiomyopathy (HCC)   2. Essential hypertension   3. Chest pain on exertion   4. History of stroke   5. Diabetes mellitus type 2 with neurological manifestations (HCC)   6. Stable angina pectoris (HCC)      PLAN:  In order of problems listed above:  NICM LVEF 40-45% 2017 on echo-no CHF on exam. On entresto, metoprolol and aldactone-will repeat echo to see if any improvement  Essential HTN BP controlled  Chest pain with ER visit to [facility] 03/24/19 Troponins negative and EKG without acute change. Normal Coronary arteries on cath 2017. Pain exertional and on Friday had heaviness. Also dyspnea on exertion. Will order Lexiscan myoview  History of CVA/TIA a few months ago and placed on ASA 325 mg daily  DM2-well controlled    Medication Adjustments/Labs and Tests Ordered: Current medicines are reviewed at length with the patient today.  Concerns regarding medicines are outlined above.  Medication changes, Labs and Tests ordered today are listed in the  Patient Instructions below. There are no Patient Instructions on file for this visit.   Elson Clan, PA-C  03/28/2019 2:03 PM    [facility] [address] [place], [place], [place]  [address] Phone: [phone]; Fax: [phone]

## 2019-03-28 NOTE — Telephone Encounter (Signed)
Appointment given to patient for today @1 :30 pm with [facility].

## 2019-03-28 NOTE — Telephone Encounter (Signed)
Will need [facility] d/c f/u chest pain-1-2 weeks

## 2019-03-28 NOTE — Telephone Encounter (Signed)
Returning call.

## 2019-04-08 ENCOUNTER — Encounter (HOSPITAL_BASED_OUTPATIENT_CLINIC_OR_DEPARTMENT_OTHER)
Admission: RE | Admit: 2019-04-08 | Discharge: 2019-04-08 | Disposition: A | Discharge status: Home / Self Care | Payer: No Typology Code available for payment source | Source: Ambulatory Visit | Attending: Physician Assistant | Admitting: Physician Assistant

## 2019-04-08 ENCOUNTER — Encounter (HOSPITAL_COMMUNITY): Payer: Self-pay

## 2019-04-08 ENCOUNTER — Telehealth: Payer: Self-pay | Admitting: Physician Assistant

## 2019-04-08 ENCOUNTER — Other Ambulatory Visit: Payer: Self-pay

## 2019-04-08 ENCOUNTER — Encounter (HOSPITAL_COMMUNITY)
Admission: RE | Admit: 2019-04-08 | Discharge: 2019-04-08 | Disposition: A | Discharge status: Home / Self Care | Payer: No Typology Code available for payment source | Source: Ambulatory Visit | Attending: Physician Assistant | Admitting: Physician Assistant

## 2019-04-08 ENCOUNTER — Ambulatory Visit (HOSPITAL_COMMUNITY)
Admission: RE | Admit: 2019-04-08 | Discharge: 2019-04-08 | Disposition: A | Discharge status: Home / Self Care | Payer: Medicare Other | Source: Ambulatory Visit | Attending: Physician Assistant | Admitting: Physician Assistant

## 2019-04-08 DIAGNOSIS — I428 Other cardiomyopathies: Secondary | ICD-10-CM | POA: Diagnosis not present

## 2019-04-08 DIAGNOSIS — I208 Other forms of angina pectoris: Secondary | ICD-10-CM | POA: Insufficient documentation

## 2019-04-08 LAB — NM MYOCAR MULTI W/SPECT W/WALL MOTION / EF
LV dias vol: 186 mL (ref 62–150)
LV sys vol: 126 mL
Peak HR: 88 {beats}/min
RATE: 0.44
Rest HR: 47 {beats}/min
SDS: 3
SRS: 7
SSS: 10
TID: 1.31

## 2019-04-08 MED ORDER — REGADENOSON 0.4 MG/5ML IV SOLN
INTRAVENOUS | Status: AC
  Administered 2019-04-08: 0.4 mg via INTRAVENOUS
  Filled 2019-04-08: qty 5

## 2019-04-08 MED ORDER — TECHNETIUM TC 99M TETROFOSMIN IV KIT
30.0000 | PACK | Freq: Once | INTRAVENOUS | Status: AC | PRN
  Administered 2019-04-08: 09:00:00 32 via INTRAVENOUS

## 2019-04-08 MED ORDER — TECHNETIUM TC 99M TETROFOSMIN IV KIT
10.0000 | PACK | Freq: Once | INTRAVENOUS | Status: AC | PRN
  Administered 2019-04-08: 08:00:00 10 via INTRAVENOUS

## 2019-04-08 MED ORDER — SODIUM CHLORIDE 0.9% FLUSH
INTRAVENOUS | Status: AC
  Administered 2019-04-08: 09:00:00 10 mL via INTRAVENOUS
  Filled 2019-04-08: qty 10

## 2019-04-08 NOTE — Telephone Encounter (Signed)
Returning call for results  °

## 2019-04-08 NOTE — Progress Notes (Signed)
2D complete echocardiogram performed.

## 2019-04-08 NOTE — Telephone Encounter (Signed)
Echo results given 

## 2019-04-13 ENCOUNTER — Ambulatory Visit (INDEPENDENT_AMBULATORY_CARE_PROVIDER_SITE_OTHER): Payer: Medicare Other | Admitting: Student

## 2019-04-13 ENCOUNTER — Other Ambulatory Visit: Payer: Self-pay

## 2019-04-13 ENCOUNTER — Encounter: Payer: Self-pay | Admitting: Student

## 2019-04-13 VITALS — BP 126/66 | HR 70 | Ht 71.0 in | Wt 244.0 lb

## 2019-04-13 DIAGNOSIS — R079 Chest pain, unspecified: Secondary | ICD-10-CM

## 2019-04-13 DIAGNOSIS — R0602 Shortness of breath: Secondary | ICD-10-CM

## 2019-04-13 DIAGNOSIS — I5022 Chronic systolic (congestive) heart failure: Secondary | ICD-10-CM | POA: Diagnosis not present

## 2019-04-13 DIAGNOSIS — R0600 Dyspnea, unspecified: Secondary | ICD-10-CM | POA: Diagnosis not present

## 2019-04-13 DIAGNOSIS — I10 Essential (primary) hypertension: Secondary | ICD-10-CM | POA: Diagnosis not present

## 2019-04-13 DIAGNOSIS — I209 Angina pectoris, unspecified: Secondary | ICD-10-CM | POA: Diagnosis not present

## 2019-04-13 DIAGNOSIS — E785 Hyperlipidemia, unspecified: Secondary | ICD-10-CM

## 2019-04-13 DIAGNOSIS — I208 Other forms of angina pectoris: Secondary | ICD-10-CM

## 2019-04-13 DIAGNOSIS — R0609 Other forms of dyspnea: Secondary | ICD-10-CM

## 2019-04-13 MED ORDER — ATORVASTATIN CALCIUM 20 MG PO TABS: 20.0000 mg | ORAL_TABLET | Freq: Every day | ORAL | 3 refills | Status: DC

## 2019-04-13 MED ORDER — METOPROLOL SUCCINATE ER 25 MG PO TB24: 25.0000 mg | ORAL_TABLET | Freq: Two times a day (BID) | ORAL | 0 refills | Status: DC

## 2019-04-13 MED ORDER — NITROGLYCERIN 0.4 MG SL SUBL: 0.4000 mg | SUBLINGUAL_TABLET | SUBLINGUAL | 3 refills | Status: AC | PRN

## 2019-04-13 NOTE — Patient Instructions (Signed)
Medication Instructions:  Your physician has recommended you make the following change in your medication:  Decrease Atorvastatin to 20 mg Daily    If you need a refill on your cardiac medications before your next appointment, please call your pharmacy.   Lab work: Your physician recommends that you return for lab work in: 1 week before CT Scan   If you have labs (blood work) drawn today and your tests are completely normal, you will receive your results only by: [name] MyChart Message (if you have MyChart) OR . A paper copy in the mail If you have any lab test that is abnormal or we need to change your treatment, we will call you to review the results.  Testing/Procedures: Non-Cardiac CT scanning, (CAT scanning), is a noninvasive, special x-ray that produces cross-sectional images of the body using x-rays and a computer. CT scans help physicians diagnose and treat medical conditions. For some CT exams, a contrast material is used to enhance visibility in the area of the body being studied. CT scans provide greater clarity and reveal more details than regular x-ray exams.    Follow-Up: At [facility], you and your health needs are our priority.  As part of our continuing mission to provide you with exceptional heart care, we have created designated Provider Care Teams.  These Care Teams include your primary Cardiologist (physician) and Advanced Practice Providers (APPs -  Physician Assistants and Nurse Practitioners) who all work together to provide you with the care you need, when you need it. You will need a follow up appointment in 2 months.  Please call our office 2 months in advance to schedule this appointment.  You may see Carlyle Dolly, MD or one of the following Advanced Practice Providers on your designated Care Team:   Bernerd Pho, PA-C [facility]) . Ermalinda Barrios, PA-C ([place])  Any Other Special Instructions Will Be Listed Below (If Applicable). Thank  you for choosing [facility]!    Your cardiac CT will be scheduled at one of the below locations:   [facility] [address] [place], [place] [address] [phone] [facility] [address] [place], [place] [address] [phone]  If scheduled at [facility], please arrive at the [facility] main entrance of [facility] 30-45 minutes prior to test start time. Proceed to the [facility] Radiology Department (first floor) to check-in and test prep.  If scheduled at [facility], please arrive 15 mins early for check-in and test prep.  Please follow these instructions carefully (unless otherwise directed):  Hold all erectile dysfunction medications at least 3 days (72 hrs) prior to test.  On the Night Before the Test: . Be sure to Drink plenty of water. . Do not consume any caffeinated/decaffeinated beverages or chocolate 12 hours prior to your test. . Do not take any antihistamines 12 hours prior to your test. . If the patient has contrast allergy: ? Patient will need a prescription for Prednisone and very clear instructions (as follows): 1. Prednisone 50 mg - take 13 hours prior to test 2. Take another Prednisone 50 mg 7 hours prior to test 3. Take another Prednisone 50 mg 1 hour prior to test 4. Take Benadryl 50 mg 1 hour prior to test . Patient must complete all four doses of above prophylactic medications. . Patient will need a ride after test due to Benadryl.  On the Day of the  Test: . Drink plenty of water. Do not drink any water within one hour of the test. . Do not eat any food 4 hours prior to the test. . You may take your regular medications prior to the test.  . Take metoprolol (Lopressor) two hours prior to test. . HOLD Furosemide/Hydrochlorothiazide morning of the test. . FEMALES- please wear underwire-free bra if available   *For Clinical  Staff only. Please instruct patient the following:*        -Drink plenty of water       -Hold Furosemide/hydrochlorothiazide morning of the test       -Take metoprolol (Lopressor) 2 hours prior to test (if applicable).                  -If HR is less than 55 BPM- No Beta Blocker                -IF HR is greater than 55 BPM and patient is less than or equal to 33 yrs old Lopressor 100mg  x1.                -If HR is greater than 55 BPM and patient is greater than 41 yrs old Lopressor 50 mg x1.     Do not give Lopressor to patients with an allergy to lopressor or anyone with asthma or active COPD symptoms (currently taking steroids).       After the Test: . Drink plenty of water. . After receiving IV contrast, you may experience a mild flushed feeling. This is normal. . On occasion, you may experience a mild rash up to 24 hours after the test. This is not dangerous. If this occurs, you can take Benadryl 25 mg and increase your fluid intake. . If you experience trouble breathing, this can be serious. If it is severe call 911 IMMEDIATELY. If it is mild, please call our office. . If you take any of these medications: Glipizide/Metformin, Avandament, Glucavance, please do not take 48 hours after completing test unless otherwise instructed.    Please contact the cardiac imaging nurse navigator should you have any questions/concerns 61, RN Navigator Cardiac Imaging [facility] [phone] Office

## 2019-04-13 NOTE — Progress Notes (Signed)
Cardiology Office Note    Date:  04/13/2019   ID:  Dalton Porter, DOB 29-Aug-1955, MRN [id number]  PCP:  [facility]  Cardiologist: Dina Rich, MD    Chief Complaint  Patient presents with  . Follow-up    recent NST and echocardiogram    History of Present Illness:    Dalton Porter is a 63 y.o. male with past medical history of chronic systolic CHF/NICM (EF 25-35% by cath in 2017 with normal cors, s/p ICD placement in 12/2017), HTN, Type 2 DM, and history of CVA who presents to the office today for follow-up from his recent stress test and echocardiogram.  He was most recently evaluated by Jacolyn Reedy, PA-C in 03/2019 and had recently been evaluated at [facility] for chest pain. Troponin values were negative and EKG showed no acute ischemic changes, therefore he was discharged home. He was still having episodes of shooting chest discomfort at the time of his office visit which had awoken him from sleep at times. Symptoms would typically last for several minutes and spontaneously resolve. He did report episodes of exertional pain as well and a Lexiscan Myoview was ordered for further evaluation. This was performed on 04/08/2019 and showed a medium defect of moderate severity present in the mid anterior, mid inferoseptal, mid inferior, apical anterior and apical inferior location and findings were overall consistent with prior myocardial infarction with peri-infarct ischemia. Was read as a high risk study given this along with his EF of 32%. An echocardiogram was also obtained and showed that his EF was actually at 50 to 55% with no regional wall motion abnormalities.  In talking with the patient today, he reports still having dyspnea on exertion which occurs when walking up his driveway or doing gardening activities. Says this is new over the past 3 to 4 months. He also reports associated chest discomfort which can occur at rest or with activity but says this has  actually improved since his last visit. Describes it as more of a shooting pain along his left pectoral region which radiates into his back. He has utilized sublingual nitroglycerin in the past with improvement in his symptoms.  He denies any specific orthopnea, PND or lower extremity edema. Says that weight has overall been stable on his home scales. He reports good compliance with his current medication regimen and tries to follow a low-sodium diet.   Past Medical History:  Diagnosis Date  . Back pain, chronic   . CHF (congestive heart failure) (HCC)   . Diabetes mellitus   . Nonischemic cardiomyopathy (HCC)    a. EF 40% by echo in 2003 b. 03/2016: cath showing normal cors and EF of 25-35%, EF 40-45% by echo with diffuse hypokinesis.    Past Surgical History:  Procedure Laterality Date  . ANKLE SURGERY    . APPENDECTOMY    . BACK SURGERY     x3  . CARDIAC CATHETERIZATION  2002  . CARDIAC CATHETERIZATION N/A 04/08/2016   Procedure: Left Heart Cath and Coronary Angiography;  Surgeon: Peter M Swaziland, MD;  Location: [facility] INVASIVE CV LAB;  Service: Cardiovascular;  Laterality: N/A;  . CHOLECYSTECTOMY    . ELBOW SURGERY    . ICD IMPLANT  01/26/2018   SJM Fortify Assura VR ICD implanted by Dr Marily Memos for primary prevention of sudden death  . THUMB FUSION      Current Medications: Outpatient Medications Prior to Visit  Medication Sig Dispense Refill  . aspirin 325  MG tablet Take 325 mg by mouth daily.    . Cholecalciferol (VITAMIN D3) 5000 units CAPS Take 1 capsule by mouth daily.    . furosemide (LASIX) 20 MG tablet Take 2 tablets (40 mg total) by mouth every morning. and 20 mg in the evening 270 tablet 0  . glipiZIDE (GLUCOTROL) 5 MG tablet Take 5 mg by mouth 2 (two) times daily before a meal.      . metFORMIN (GLUCOPHAGE) 500 MG tablet Take 500 mg by mouth 2 (two) times daily with a meal.      . sacubitril-valsartan (ENTRESTO) 24-26 MG Take 1 tablet by mouth 2 (two) times daily. 180  tablet 2  . spironolactone (ALDACTONE) 25 MG tablet Take 25 mg by mouth daily. [facility] and [facility] reports on 11/30/2018 that he takes daily.    . TRAZODONE HCL PO Take 100 mg by mouth at bedtime.     [name] atorvastatin (LIPITOR) 40 MG tablet Take 40 mg by mouth daily.    . metoprolol succinate (TOPROL XL) 25 MG 24 hr tablet Take 0.5 tablets (12.5 mg total) by mouth daily. 45 tablet 0   No facility-administered medications prior to visit.      Allergies:   Peanuts [nuts], Meat extract, and Codeine   Social History   Socioeconomic History  . Marital status: Married    Spouse name: Not on file  . Number of children: Not on file  . Years of education: Not on file  . Highest education level: Not on file  Occupational History  . Not on file  Social Needs  . Financial resource strain: Not on file  . Food insecurity    Worry: Not on file    Inability: Not on file  . Transportation needs    Medical: Not on file    Non-medical: Not on file  Tobacco Use  . Smoking status: Never Smoker  . Smokeless tobacco: Never Used  Substance and Sexual Activity  . Alcohol use: No  . Drug use: No  . Sexual activity: Yes  Lifestyle  . Physical activity    Days per week: Not on file    Minutes per session: Not on file  . Stress: Not on file  Relationships  . Social Musician on phone: Not on file    Gets together: Not on file    Attends religious service: Not on file    Active member of club or organization: Not on file    Attends meetings of clubs or organizations: Not on file    Relationship status: Not on file  Other Topics Concern  . 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   Review of  Systems:   Please see the history of present illness.     General:  No chills, fever, night sweats or weight changes.  Cardiovascular:  No edema, orthopnea, palpitations, paroxysmal nocturnal dyspnea. Positive for chest pain and dyspnea on exertion.  Dermatological: No rash, lesions/masses Respiratory: No cough, dyspnea Urologic: No hematuria, dysuria Abdominal:   No nausea, vomiting, diarrhea, bright red blood per rectum, melena, or hematemesis Neurologic:  No visual changes, wkns, changes in  mental status. All other systems reviewed and are otherwise negative except as noted above.   Physical Exam:    VS:  BP 126/66   Pulse 70   Ht 5\' 11"  (1.803 m)   Wt 244 lb (110.7 kg)   SpO2 97%   BMI 34.03 kg/m    General: Well developed, well nourished,male appearing in no acute distress. Head: Normocephalic, atraumatic, sclera non-icteric, no xanthomas, nares are without discharge.  Neck: No carotid bruits. JVD not elevated.  Lungs: Respirations regular and unlabored, without wheezes or rales.  Heart: Regular rate and rhythm. No S3 or S4.  No murmur, no rubs, or gallops appreciated. Abdomen: Soft, non-tender, non-distended with normoactive bowel sounds. No hepatomegaly. No rebound/guarding. No obvious abdominal masses. Msk:  Strength and tone appear normal for age. No joint deformities or effusions. Extremities: No clubbing or cyanosis. No lower extremity edema.  Distal pedal pulses are 2+ bilaterally. Neuro: Alert and oriented X 3. Moves all extremities spontaneously. No focal deficits noted. Psych:  Responds to questions appropriately with a normal affect. Skin: No rashes or lesions noted  Wt Readings from Last 3 Encounters:  04/13/19 244 lb (110.7 kg)  03/28/19 234 lb (106.1 kg)  01/19/19 248 lb (112.5 kg)    Studies/Labs Reviewed:   EKG:  EKG is not ordered today.   Recent Labs: No results found for requested labs within last 8760 hours.   Lipid Panel    Component Value  Date/Time   CHOL 153 04/08/2016 0544   TRIG 224 (H) 04/08/2016 0544   HDL 32 (L) 04/08/2016 0544   CHOLHDL 4.8 04/08/2016 0544   VLDL 45 (H) 04/08/2016 0544   LDLCALC 76 04/08/2016 0544    Additional studies/ records that were reviewed today include:   Cardiac Catheterization: 04/08/2016  There is severe left ventricular systolic dysfunction.  LV end diastolic pressure is mildly elevated.  The left ventricular ejection fraction is 25-35% by visual estimate.   1. Normal coronary anatomy 2. Severe LV dysfunction- global 3. Mildly elevated LVEDP  Plan: optimize medical therapy for CHF.   NST: 04/08/2019  There was no ST segment deviation noted during stress.  Defect 1: There is a medium defect of moderate severity present in the mid anterior, mid inferoseptal, mid inferior, apical anterior and apical inferior location.  Findings consistent with prior myocardial infarction with peri-infarct ischemia.  This is a high risk study.  Nuclear stress EF: 32%.   Echocardiogram: 04/08/2019 IMPRESSIONS   1. Left ventricular ejection fraction, by visual estimation, is 50 to 55%. The left ventricle has normal function. Normal left ventricular size. There is borderline left ventricular hypertrophy.  2. Left ventricular diastolic Doppler parameters are consistent with impaired relaxation pattern of LV diastolic filling.  3. Global right ventricle has normal systolic function.The right ventricular size is normal. No increase in right ventricular wall thickness.  4. Left atrial size was normal.  5. Right atrial size was normal.  6. The mitral valve is grossly normal. Mild mitral valve regurgitation.  7. The tricuspid valve is grossly normal. Tricuspid valve regurgitation is trivial.  8. The aortic valve is tricuspid Aortic valve regurgitation was not visualized by color flow Doppler. Structurally normal aortic valve, with no evidence of sclerosis or stenosis.  9. The pulmonic valve was  grossly normal. Pulmonic valve regurgitation is not visualized by color flow Doppler. 10. Mildly elevated pulmonary artery systolic pressure. 11. The inferior vena cava is normal in size with greater than 50% respiratory variability, suggesting  right atrial pressure of 3 mmHg.   Assessment:    1. Dyspnea on exertion   2. Chest pain on exertion   3. Chronic systolic heart failure (HCC)   4. Essential hypertension   5. Hyperlipidemia LDL goal <70      Plan:   In order of problems listed above:  1. Dyspnea on Exertion/ Chest Pain with Mixed Typical and Atypical Features - He reports still having dyspnea on exertion which is new over the past few months and is still experiencing intermittent episodes of chest discomfort but says this has overall improved. His episodes of chest discomfort seem atypical as he describes it as a sharp pain but has been responsive to SL NTG.  - recent [employer] Myoview showed a medium defect of moderate severity present in the mid anterior, mid inferoseptal, mid inferior, apical anterior and apical inferior location and findings were overall consistent with prior myocardial infarction with peri-infarct ischemia.  - Given his history of normal cors by cardiac catheterization in 2017, I did review with Dr. Purvis SheffieldKoneswaran (covering for Dr. Wyline MoodBranch) and he was in agreement to proceed with a Coronary CT before pursuing repeat catheterization given his normal study 3 years ago. This was reviewed with the patient and he agrees to proceed with Coronary CT as well. Updated Rx for SL NTG provided.   2. Chronic Systolic CHF/ NICM - he has a known reduced EF of 25-35% by cath in 2017 with normal cors and is s/p ICD placement. Most recent echocardiogram showed his EF actually improved to 50 to 55%. Remains on Toprol-XL 25 mg twice daily (listed as 12.5mg  daily but he called back to confirm dosing) along with Entresto 24-26mg  BID and Spironolactone 25mg  daily. Continue Lasix 40 mg in AM  and 20 mg in PM.    3. HTN - BP is well controlled at 126/66 during today's visit.  Continue current medication regimen.  4. HLD - he reports having been started on Atorvastatin 40 mg daily due to his recent CVA. He has experienced leg pains and muscle aches since being started on statin therapy. I recommended he reduce his dose to 20 mg daily and reassess symptoms.   Medication Adjustments/Labs and Tests Ordered: Current medicines are reviewed at length with the patient today.  Concerns regarding medicines are outlined above.  Medication changes, Labs and Tests ordered today are listed in the Patient Instructions below. Patient Instructions  Medication Instructions:  Your physician has recommended you make the following change in your medication:  Decrease Atorvastatin to 20 mg Daily    If you need a refill on your cardiac medications before your next appointment, please call your pharmacy.   Lab work: Your physician recommends that you return for lab work in: 1 week before CT Scan   If you have labs (blood work) drawn today and your tests are completely normal, you will receive your results only by: [name]. MyChart Message (if you have MyChart) OR . A paper copy in the mail If you have any lab test that is abnormal or we need to change your treatment, we will call you to review the results.  Testing/Procedures: Non-Cardiac CT scanning, (CAT scanning), is a noninvasive, special x-ray that produces cross-sectional images of the body using x-rays and a computer. CT scans help physicians diagnose and treat medical conditions. For some CT exams, a contrast material is used to enhance visibility in the area of the body being studied. CT scans provide greater clarity and reveal more  details than regular x-ray exams.    Follow-Up: At [facility], you and your health needs are our priority.  As part of our continuing mission to provide you with exceptional heart care, we have created designated  Provider Care Teams.  These Care Teams include your primary Cardiologist (physician) and Advanced Practice Providers (APPs -  Physician Assistants and Nurse Practitioners) who all work together to provide you with the care you need, when you need it. You will need a follow up appointment in 2 months.  Please call our office 2 months in advance to schedule this appointment.  You may see Carlyle Dolly, MD or one of the following Advanced Practice Providers on your designated Care Team:   Bernerd Pho, PA-C [facility]) . Ermalinda Barrios, PA-C ([place])  Any Other Special Instructions Will Be Listed Below (If Applicable). Thank you for choosing [facility]!    Your cardiac CT will be scheduled at one of the below locations:   [facility] [address] [place], [place] [address] [phone] [place] [address] [place], [place] [address] [phone]  If scheduled at [facility], please arrive at the [facility] main entrance of [facility] 30-45 minutes prior to test start time. Proceed to the [facility] Radiology Department (first floor) to check-in and test prep.  If scheduled at [facility], please arrive 15 mins early for check-in and test prep.  Please follow these instructions carefully (unless otherwise directed):  Hold all erectile dysfunction medications at least 3 days (72 hrs) prior to test.  On the Night Before the Test: . Be sure to Drink plenty of water. . Do not consume any caffeinated/decaffeinated beverages or chocolate 12 hours prior to your test. . Do not take any antihistamines 12 hours prior to your test. . If the patient has contrast allergy: ? Patient will need a prescription for Prednisone and very clear instructions (as follows): 1. Prednisone 50 mg - take 13 hours prior to test 2. Take another Prednisone 50 mg  7 hours prior to test 3. Take another Prednisone 50 mg 1 hour prior to test 4. Take Benadryl 50 mg 1 hour prior to test . Patient must complete all four doses of above prophylactic medications. . Patient will need a ride after test due to Benadryl.  On the Day of the Test: . Drink plenty of water. Do not drink any water within one hour of the test. . Do not eat any food 4 hours prior to the test. . You may take your regular medications prior to the test.  . Take metoprolol (Lopressor) two hours prior to test. . HOLD Furosemide/Hydrochlorothiazide morning of the test. . FEMALES- please wear underwire-free bra if available   *For Clinical Staff only. Please instruct patient the following:*        -Drink plenty of water       -Hold Furosemide/hydrochlorothiazide morning of the test       -Take metoprolol (Lopressor) 2 hours prior to test (if applicable).                  -If HR is less than 55 BPM- No Beta Blocker                -IF HR is greater than 55 BPM and patient is less than or equal to 29 yrs old Lopressor 100mg  x1.                -  If HR is greater than 55 BPM and patient is greater than 61 yrs old Lopressor 50 mg x1.     Do not give Lopressor to patients with an allergy to lopressor or anyone with asthma or active COPD symptoms (currently taking steroids).       After the Test: . Drink plenty of water. . After receiving IV contrast, you may experience a mild flushed feeling. This is normal. . On occasion, you may experience a mild rash up to 24 hours after the test. This is not dangerous. If this occurs, you can take Benadryl 25 mg and increase your fluid intake. . If you experience trouble breathing, this can be serious. If it is severe call 911 IMMEDIATELY. If it is mild, please call our office. . If you take any of these medications: Glipizide/Metformin, Avandament, Glucavance, please do not take 48 hours after completing test unless otherwise instructed.    Please  contact the cardiac imaging nurse navigator should you have any questions/concerns Rockwell AlexandriaSara Wallace, RN Navigator Cardiac Imaging [facility] [phone] Office     Signed, Ellsworth LennoxBrittany M Farin Buhman, [place] JerseyPA-C  04/13/2019 5:02 PM     [facility] [address] [place], [place] [address] Phone: [phone](336) [phone] Fax: [phone]) [phone]

## 2019-04-26 ENCOUNTER — Telehealth: Payer: Self-pay

## 2019-04-26 NOTE — Telephone Encounter (Signed)
Unable to leave a message for patient to remind of missed remote transmission.  

## 2019-05-02 NOTE — Progress Notes (Signed)
No ICM remote transmission received for 04/25/2019 and next ICM transmission scheduled for 05/31/2019.   

## 2019-05-12 ENCOUNTER — Telehealth: Payer: Self-pay | Admitting: Internal Medicine

## 2019-05-12 NOTE — Telephone Encounter (Signed)
Spoke with patient, no fevers chills, drainage, redness, swelling. I do not think this is device related but will bring in next week for appt to evaluate. Appt scheduled  Chanetta Marshall, NP 05/12/2019 10:02 AM

## 2019-05-12 NOTE — Telephone Encounter (Signed)
patient called  Stating that the site around his defib has been hurting really bad and when he raises his arm it hurts worse.

## 2019-05-12 NOTE — Telephone Encounter (Signed)
Pt says for the last 2 days has burning sensation where device is located on left side going down into left arm - denies SOB/dizziness/chest pain - will forward to device

## 2019-05-17 ENCOUNTER — Ambulatory Visit (INDEPENDENT_AMBULATORY_CARE_PROVIDER_SITE_OTHER): Payer: No Typology Code available for payment source | Admitting: Student

## 2019-05-17 ENCOUNTER — Other Ambulatory Visit: Payer: Self-pay

## 2019-05-17 DIAGNOSIS — I5022 Chronic systolic (congestive) heart failure: Secondary | ICD-10-CM | POA: Diagnosis not present

## 2019-05-17 LAB — CUP PACEART INCLINIC DEVICE CHECK
Battery Remaining Longevity: 105 mo
Brady Statistic RV Percent Paced: 8.5 %
Date Time Interrogation Session: 20201117132645
HighPow Impedance: 77.625
Implantable Lead Implant Date: 20190730
Implantable Lead Location: 753860
Implantable Pulse Generator Implant Date: 20190730
Lead Channel Impedance Value: 375 Ohm
Lead Channel Pacing Threshold Amplitude: 0.75 V
Lead Channel Pacing Threshold Amplitude: 0.75 V
Lead Channel Pacing Threshold Pulse Width: 0.5 ms
Lead Channel Pacing Threshold Pulse Width: 0.5 ms
Lead Channel Sensing Intrinsic Amplitude: 11.5 mV
Lead Channel Setting Pacing Amplitude: 2.5 V
Lead Channel Setting Pacing Pulse Width: 0.5 ms
Lead Channel Setting Sensing Sensitivity: 0.5 mV
Pulse Gen Serial Number: 9839597

## 2019-05-17 NOTE — Progress Notes (Signed)
ICD check in clinic due to pain in left shoulder. Normal device function. Thresholds and sensing consistent with previous device measurements. Impedance trends stable over time. No mode switches. No ventricular arrhythmias. Histogram distribution appropriate for patient and level of activity. No changes made this session. Device programmed at appropriate safety margins. Device programmed to optimize intrinsic conduction. Estimated longevity 8 yr, 9 mo. Pt enrolled in remote follow-up. Patient education completed including shock plan. Auditory/vibratory alert demonstrated.  Patient with nodular tissue over device. Dr. Rayann Heman to assess. Likely musculoskeletal. Continue watchful waiting at this time.

## 2019-05-18 ENCOUNTER — Other Ambulatory Visit: Payer: Self-pay | Admitting: Cardiology

## 2019-05-18 MED ORDER — ENTRESTO 24-26 MG PO TABS: 1.0000 | ORAL_TABLET | Freq: Two times a day (BID) | ORAL | 3 refills | Status: DC

## 2019-05-18 NOTE — Telephone Encounter (Signed)
° ° °  1. Which medications need to be refilled? (please list name of each medication and dose if known)    sacubitril-valsartan (ENTRESTO) 24-26    2. Which pharmacy/location (including street and city if local pharmacy) is medication to be sent to? [place]   3. Do they need a 30 day or 90 day supply?

## 2019-05-18 NOTE — Telephone Encounter (Signed)
Done

## 2019-05-20 ENCOUNTER — Telehealth: Payer: Self-pay | Admitting: Internal Medicine

## 2019-05-20 NOTE — Telephone Encounter (Signed)
Virtual Visit Pre-Appointment Phone Call  "(Name), I am calling you today to discuss your upcoming appointment. We are currently trying to limit exposure to the virus that causes COVID-19 by seeing patients at home rather than in the office."  1. "What is the BEST phone number to call the day of the visit?" - include this in appointment notes  2. Do you have or have access to (through a family member/friend) a smartphone with video capability that we can use for your visit?" a. If yes - list this number in appt notes as cell (if different from BEST phone #) and list the appointment type as a VIDEO visit in appointment notes b. If no - list the appointment type as a PHONE visit in appointment notes  3. Confirm consent - "In the setting of the current Covid19 crisis, you are scheduled for a (phone or video) visit with your provider on (date) at (time).  Just as we do with many in-office visits, in order for you to participate in this visit, we must obtain consent.  If you'd like, I can send this to your mychart (if signed up) or email for you to review.  Otherwise, I can obtain your verbal consent now.  All virtual visits are billed to your insurance company just like a normal visit would be.  By agreeing to a virtual visit, we'd like you to understand that the technology does not allow for your provider to perform an examination, and thus may limit your provider's ability to fully assess your condition. If your provider identifies any concerns that need to be evaluated in person, we will make arrangements to do so.  Finally, though the technology is pretty good, we cannot assure that it will always work on either your or our end, and in the setting of a video visit, we may have to convert it to a phone-only visit.  In either situation, we cannot ensure that we have a secure connection.  Are you willing to proceed?" STAFF: Did the patient verbally acknowledge consent to telehealth visit? Document  YES/NO here: YES  4. Advise patient to be prepared - "Two hours prior to your appointment, go ahead and check your blood pressure, pulse, oxygen saturation, and your weight (if you have the equipment to check those) and write them all down. When your visit starts, your provider will ask you for this information. If you have an Apple Watch or Kardia device, please plan to have heart rate information ready on the day of your appointment. Please have a pen and paper handy nearby the day of the visit as well."  5. Give patient instructions for MyChart download to smartphone OR Doximity/Doxy.me as below if video visit (depending on what platform provider is using)  6. Inform patient they will receive a phone call 15 minutes prior to their appointment time (may be from unknown caller ID) so they should be prepared to answer    TELEPHONE CALL NOTE  Dalton Porter has been deemed a candidate for a follow-up tele-health visit to limit community exposure during the Covid-19 pandemic. I spoke with the patient via phone to ensure availability of phone/video source, confirm preferred email & phone number, and discuss instructions and expectations.  I reminded Belva Chimes to be prepared with any vital sign and/or heart rhythm information that could potentially be obtained via home monitoring, at the time of his visit. I reminded Belva Chimes to expect a phone call prior to  his visit.  FLEM ENDERLE 05/20/2019 11:42 AM   INSTRUCTIONS FOR DOWNLOADING THE MYCHART APP TO SMARTPHONE  - The patient must first make sure to have activated MyChart and know their login information - If Apple, go to [employer] and type in MyChart in the search bar and download the app. If Android, ask patient to go to [employer] and type in [place] in the search bar and download the app. The app is free but as with any other app downloads, their phone may require them to verify saved payment information or Apple/Android  password.  - The patient will need to then log into the app with their MyChart username and password, and select [facility] as their healthcare provider to link the account. When it is time for your visit, go to the MyChart app, find appointments, and click Begin Video Visit. Be sure to Select Allow for your device to access the Microphone and Camera for your visit. You will then be connected, and your provider will be with you shortly.  **If they have any issues connecting, or need assistance please contact MyChart service desk [phone])**  **If using a computer, in order to ensure the best quality for their visit they will need to use either of the following Internet Browsers: [employer], or Google Chrome**  IF USING DOXIMITY or DOXY.ME - The patient will receive a link just prior to their visit by text.     FULL LENGTH CONSENT FOR TELE-HEALTH VISIT   I hereby voluntarily request, consent and authorize [facility] and its employed or contracted physicians, physician assistants, nurse practitioners or other licensed health care professionals (the Practitioner), to provide me with telemedicine health care services (the Services") as deemed necessary by the treating Practitioner. I acknowledge and consent to receive the Services by the Practitioner via telemedicine. I understand that the telemedicine visit will involve communicating with the Practitioner through live audiovisual communication technology and the disclosure of certain medical information by electronic transmission. I acknowledge that I have been given the opportunity to request an in-person assessment or other available alternative prior to the telemedicine visit and am voluntarily participating in the telemedicine visit.  I understand that I have the right to withhold or withdraw my consent to the use of telemedicine in the course of my care at any time, without affecting my right to future care or treatment,  and that the Practitioner or I may terminate the telemedicine visit at any time. I understand that I have the right to inspect all information obtained and/or recorded in the course of the telemedicine visit and may receive copies of available information for a reasonable fee.  I understand that some of the potential risks of receiving the Services via telemedicine include:   Delay or interruption in medical evaluation due to technological equipment failure or disruption;  Information transmitted may not be sufficient (e.g. poor resolution of images) to allow for appropriate medical decision making by the Practitioner; and/or   In rare instances, security protocols could fail, causing a breach of personal health information.  Furthermore, I acknowledge that it is my responsibility to provide information about my medical history, conditions and care that is complete and accurate to the best of my ability. I acknowledge that Practitioner's advice, recommendations, and/or decision may be based on factors not within their control, such as incomplete or inaccurate data provided by me or distortions of diagnostic images or specimens that may result from electronic transmissions. I  understand that the practice of medicine is not an exact science and that Practitioner makes no warranties or guarantees regarding treatment outcomes. I acknowledge that I will receive a copy of this consent concurrently upon execution via email to the email address I last provided but may also request a printed copy by calling the office of [facility].    I understand that my insurance will be billed for this visit.   I have read or had this consent read to me.  I understand the contents of this consent, which adequately explains the benefits and risks of the Services being provided via telemedicine.   I have been provided ample opportunity to ask questions regarding this consent and the Services and have had my questions  answered to my satisfaction.  I give my informed consent for the services to be provided through the use of telemedicine in my medical care  By participating in this telemedicine visit I agree to the above.

## 2019-05-23 NOTE — Addendum Note (Signed)
Addended by: Barbarann Ehlers A on: 05/23/2019 10:41 AM   Modules accepted: Orders

## 2019-05-24 ENCOUNTER — Other Ambulatory Visit: Payer: Self-pay

## 2019-05-24 ENCOUNTER — Other Ambulatory Visit (HOSPITAL_COMMUNITY)
Admission: RE | Admit: 2019-05-24 | Discharge: 2019-05-24 | Disposition: A | Discharge status: Home / Self Care | Payer: Medicare Other | Source: Ambulatory Visit | Attending: Student | Admitting: Student

## 2019-05-24 DIAGNOSIS — R0600 Dyspnea, unspecified: Secondary | ICD-10-CM | POA: Insufficient documentation

## 2019-05-24 LAB — BASIC METABOLIC PANEL
Anion gap: 10 (ref 5–15)
BUN: 20 mg/dL (ref 8–23)
CO2: 25 mmol/L (ref 22–32)
Calcium: 9.1 mg/dL (ref 8.9–10.3)
Chloride: 103 mmol/L (ref 98–111)
Creatinine, Ser: 1.21 mg/dL (ref 0.61–1.24)
GFR calc Af Amer: >60 mL/min (ref >60)
GFR calc non Af Amer: >60 mL/min (ref >60)
Glucose, Bld: 173 mg/dL — ABNORMAL HIGH (ref 70–99)
Potassium: 4.6 mmol/L (ref 3.5–5.1)
Sodium: 138 mmol/L (ref 135–145)

## 2019-05-27 ENCOUNTER — Other Ambulatory Visit: Payer: Self-pay

## 2019-05-27 ENCOUNTER — Encounter (HOSPITAL_COMMUNITY): Payer: Self-pay

## 2019-05-27 ENCOUNTER — Ambulatory Visit (HOSPITAL_COMMUNITY)
Admission: RE | Admit: 2019-05-27 | Discharge: 2019-05-27 | Disposition: A | Discharge status: Home / Self Care | Payer: No Typology Code available for payment source | Source: Ambulatory Visit | Attending: Student | Admitting: Student

## 2019-05-27 MED ORDER — METOPROLOL TARTRATE 5 MG/5ML IV SOLN: 5.0000 mg | INTRAVENOUS | Status: DC | PRN

## 2019-05-27 MED ORDER — NITROGLYCERIN 0.4 MG SL SUBL: 0.8000 mg | SUBLINGUAL_TABLET | Freq: Once | SUBLINGUAL | Status: DC

## 2019-05-27 MED ORDER — METOPROLOL TARTRATE 5 MG/5ML IV SOLN
INTRAVENOUS | Status: AC
  Filled 2019-05-27: qty 10

## 2019-05-27 NOTE — Progress Notes (Signed)
Patient reported to this nurse he took lopressor at 0100 this am while at work. Patients heart rate noted in lower 70's, BP mid 17'B systolic. This nurse spoke to Dr. Radford Pax and per Dr. Radford Pax, patient needs to be rescheduled d/t time of medication taken and BP to low to give further medication.   Alford Highland., RN, CT Navigator informed to reschedule patient per Dr. Radford Pax and make Dr. Ahmed Prima aware. Verbalized understanding.   Patient informed and updated as to situation. Patient agreeable to come back to for reschedule. Infomrmed patient CT Navigator nurse will call him to go over medications.

## 2019-05-30 ENCOUNTER — Ambulatory Visit (INDEPENDENT_AMBULATORY_CARE_PROVIDER_SITE_OTHER): Payer: No Typology Code available for payment source | Admitting: *Deleted

## 2019-05-30 DIAGNOSIS — I428 Other cardiomyopathies: Secondary | ICD-10-CM | POA: Diagnosis not present

## 2019-05-31 ENCOUNTER — Ambulatory Visit (INDEPENDENT_AMBULATORY_CARE_PROVIDER_SITE_OTHER): Payer: No Typology Code available for payment source

## 2019-05-31 DIAGNOSIS — I5022 Chronic systolic (congestive) heart failure: Secondary | ICD-10-CM

## 2019-05-31 DIAGNOSIS — Z95810 Presence of automatic (implantable) cardiac defibrillator: Secondary | ICD-10-CM

## 2019-05-31 LAB — CUP PACEART REMOTE DEVICE CHECK
Battery Remaining Longevity: 103 mo
Battery Remaining Percentage: 89 %
Battery Voltage: 3.11 V
Brady Statistic RV Percent Paced: 12 %
Date Time Interrogation Session: 20201130174710
HighPow Impedance: 73 Ohm
HighPow Impedance: 73 Ohm
Implantable Lead Implant Date: 20190730
Implantable Lead Location: 753860
Implantable Pulse Generator Implant Date: 20190730
Lead Channel Impedance Value: 360 Ohm
Lead Channel Pacing Threshold Amplitude: 0.75 V
Lead Channel Pacing Threshold Pulse Width: 0.5 ms
Lead Channel Sensing Intrinsic Amplitude: 11.5 mV
Lead Channel Setting Pacing Amplitude: 2.5 V
Lead Channel Setting Pacing Pulse Width: 0.5 ms
Lead Channel Setting Sensing Sensitivity: 0.5 mV
Pulse Gen Serial Number: 9839597

## 2019-06-01 ENCOUNTER — Telehealth: Payer: Self-pay

## 2019-06-01 NOTE — Telephone Encounter (Signed)
Remote ICM transmission received.  Attempted call to patient regarding ICM remote transmission and no answer.  

## 2019-06-01 NOTE — Progress Notes (Signed)
EPIC Encounter for ICM Monitoring  Patient Name: Dalton Porter is a 63 y.o. male Date: 06/01/2019 Primary Care Physican: [facility] Primary Cardiologist:[facility] Electrophysiologist:Allred 1014/2020 Weight:244lbs   Attempted call to patient and unable to reach.   Transmission reviewed.   CorvueThoracic impedancesuggesting possible dryness starting 05/29/2019. Impedance was decreased suggesting possible fluid accumulation 05/15/2019 - 05/23/2019.  Prescribed: Furosemide20 mgtake2 tablets (40 mgtotal)in the morning and 20 mg in the evening.   Labs: 05/24/2019 Creatinine 1.21, BUN 20, Potassium 4.6, Sodium 138, GFR >60 A complete set of results can be found in Results Review.  Recommendations: Unable to reach.  Follow-up plan: ICM clinic phone appointment on 07/04/2019.  OV with Dr Rayann Heman on 06/03/2019.  Copy of ICM check sent to Dr.Allred  3 month ICM trend: 05/30/2019    1 Year ICM trend:       Rosalene Billings, RN 06/01/2019 5:13 PM

## 2019-06-03 ENCOUNTER — Telehealth (INDEPENDENT_AMBULATORY_CARE_PROVIDER_SITE_OTHER): Payer: No Typology Code available for payment source | Admitting: Internal Medicine

## 2019-06-03 ENCOUNTER — Encounter: Payer: Self-pay | Admitting: Internal Medicine

## 2019-06-03 VITALS — BP 110/58 | HR 70 | Ht 71.0 in | Wt 240.0 lb

## 2019-06-03 DIAGNOSIS — I5022 Chronic systolic (congestive) heart failure: Secondary | ICD-10-CM

## 2019-06-03 DIAGNOSIS — Z7982 Long term (current) use of aspirin: Secondary | ICD-10-CM

## 2019-06-03 DIAGNOSIS — I209 Angina pectoris, unspecified: Secondary | ICD-10-CM

## 2019-06-03 DIAGNOSIS — I428 Other cardiomyopathies: Secondary | ICD-10-CM | POA: Diagnosis not present

## 2019-06-03 NOTE — Progress Notes (Signed)
Electrophysiology TeleHealth Note   Due to national recommendations of social distancing due to COVID 19, an audio telehealth visit is felt to be most appropriate for this patient at this time.  Verbal consent was obtained by me for the telehealth visit today.  The patient does not have capability for a virtual visit.  A phone visit is therefore required today.   Date:  06/03/2019   ID:  Dalton Porter, DOB August 23, 1955, MRN [id number]  Location: patient's home  Provider location:  [facility]  Evaluation Performed: Follow-up visit  [facility]   Electrophysiologist:  Dr Johney Frame  Chief Complaint:  ICD follow up  History of Present Illness:    Dalton Porter is a 63 y.o. male who presents via telehealth conferencing today.  Since last being seen in our clinic, the patient reports doing very well.  Today, he denies symptoms of palpitations, chest pain, shortness of breath,  lower extremity edema, dizziness, presyncope, or syncope.  The patient is otherwise without complaint today.  The patient denies symptoms of fevers, chills, cough, or new SOB worrisome for COVID 19.  Past Medical History:  Diagnosis Date  . Back pain, chronic   . CHF (congestive heart failure) (HCC)   . Diabetes mellitus   . Nonischemic cardiomyopathy (HCC)    a. EF 40% by echo in 2003 b. 03/2016: cath showing normal cors and EF of 25-35%, EF 40-45% by echo with diffuse hypokinesis.    Past Surgical History:  Procedure Laterality Date  . ANKLE SURGERY    . APPENDECTOMY    . BACK SURGERY     x3  . CARDIAC CATHETERIZATION  2002  . CARDIAC CATHETERIZATION N/A 04/08/2016   Procedure: Left Heart Cath and Coronary Angiography;  Surgeon: Peter M Swaziland, MD;  Location: [facility] INVASIVE CV LAB;  Service: Cardiovascular;  Laterality: N/A;  . CHOLECYSTECTOMY    . ELBOW SURGERY    . ICD IMPLANT  01/26/2018   SJM Fortify Assura VR ICD implanted by Dr Marily Memos for primary prevention of sudden death  .  THUMB FUSION      Current Outpatient Medications  Medication Sig Dispense Refill  . aspirin 325 MG tablet Take 325 mg by mouth daily.    [name] atorvastatin (LIPITOR) 20 MG tablet Take 1 tablet (20 mg total) by mouth daily. 90 tablet 3  . Cholecalciferol (VITAMIN D3) 5000 units CAPS Take 1 capsule by mouth daily.    . furosemide (LASIX) 20 MG tablet Take 2 tablets (40 mg total) by mouth every morning. and 20 mg in the evening 270 tablet 0  . glipiZIDE (GLUCOTROL) 5 MG tablet Take 5 mg by mouth 2 (two) times daily before a meal.      . metFORMIN (GLUCOPHAGE) 500 MG tablet Take 500 mg by mouth 2 (two) times daily with a meal.      . metoprolol succinate (TOPROL XL) 25 MG 24 hr tablet Take 1 tablet (25 mg total) by mouth 2 (two) times daily. 45 tablet 0  . nitroGLYCERIN (NITROSTAT) 0.4 MG SL tablet Place 1 tablet (0.4 mg total) under the tongue every 5 (five) minutes as needed for chest pain. 25 tablet 3  . sacubitril-valsartan (ENTRESTO) 24-26 MG Take 1 tablet by mouth 2 (two) times daily. 180 tablet 3  . spironolactone (ALDACTONE) 25 MG tablet Take 25 mg by mouth daily. [facility] and pt reports on 11/30/2018 that he takes daily.    . TRAZODONE  HCL PO Take 100 mg by mouth at bedtime.      No current facility-administered medications for this visit.     Allergies:   Peanuts [nuts], Meat extract, and Codeine   Social History:  The patient  reports that he has never smoked. He has never used smokeless tobacco. He reports that he does not drink alcohol or use drugs.   Family History:  The patient's  family history includes Healthy in his sister and sister; Leukemia in his maternal grandmother; Other in his brother; Pancreatic cancer in his father; Stroke in an other family member.   ROS:  Please see the history of present illness.   All other systems are personally reviewed and negative.    Exam:    Vital Signs:  BP (!) 110/58   Pulse 70   Ht 5\' 11"  (1.803 m)   Wt 240 lb (108.9 kg)   BMI  33.47 kg/m   Well sounding, alert and conversant, regular work of breathing   Labs/Other Tests and Data Reviewed:    Recent Labs: 05/24/2019: BUN 20; Creatinine, Ser 1.21; Potassium 4.6; Sodium 138   Wt Readings from Last 3 Encounters:  06/03/19 240 lb (108.9 kg)  04/13/19 244 lb (110.7 kg)  03/28/19 234 lb (106.1 kg)     Last device remote is reviewed from [place] PDF which reveals normal device function, no arrhythmias     ASSESSMENT & PLAN:    1.  NICM/chronic systolic heart failure Stable by symptoms Normal ICD function by recent remote See [facility] report Continue follow up in ICM clinic   Follow-up:  [name], 1 year with me    Patient Risk:  after full review of this patients clinical status, I feel that they are at moderate risk at this time.  Today, I have spent 15 minutes with the patient with telehealth technology discussing arrhythmia management .    Army Fossa, MD  06/03/2019 9:29 AM     [facility] [address] [place] [address] [phone] (office) [phone] (fax)

## 2019-06-07 ENCOUNTER — Telehealth: Payer: Self-pay | Admitting: Cardiology

## 2019-06-07 NOTE — Telephone Encounter (Signed)
Pt is going to call [facility] in [place] was sent 05/18/2019

## 2019-06-07 NOTE — Telephone Encounter (Signed)
Entresto - patient is out medication and needs some to hold him over until his medication comes from [place].[name]    Stated he called in weeks ago for refill to go to [facility] and they have not received it.  Please call patient

## 2019-06-15 ENCOUNTER — Telehealth (HOSPITAL_COMMUNITY): Payer: Self-pay | Admitting: Emergency Medicine

## 2019-06-15 ENCOUNTER — Ambulatory Visit: Payer: Medicare Other | Admitting: Student

## 2019-06-15 NOTE — Progress Notes (Deleted)
Cardiology Office Note    Date:  06/15/2019   ID:  Dalton Porter, DOB 30-Nov-1955, MRN [id number]  PCP:  [facility]  Cardiologist: Dina Rich, MD    No chief complaint on file.   History of Present Illness:    Dalton Porter is a 63 y.o. male with past medical history of chronic systolic CHF/ NICM (EF 25-35% in 2017 with cath showing normal cors, s/p ICD placement in 12/2017), HTN, Type 2 DM and history of CVA who presents to the office today for 77-month follow-up.  He was last examined by myself following his recent stress test which has shown a medium defect of moderate severity present in the mid anterior, mid inferoseptal, mid inferior, apical anterior and apical inferior location and findings were overall consistent with prior myocardial infarction with peri-infarct ischemia.  He reported still having dyspnea on exertion at the time of his visit and noted intermittent episodes of chest pain which could occur at rest or with activity.  Given his stress test findings and symptoms, options were reviewed with the DOD given his prior normal catheterization in 2017 and it was recommended to proceed with a Coronary CT for further evaluation.  He did call the office in 05/2019 reporting discomfort along his device and was evaluated in the device clinic on 05/17/2019 and this was found to be functioning normally.  He was noted to have nodular tissue over his device and his pain was overall thought to be most consistent with musculoskeletal discomfort.  He did present for his coronary CT on 05/27/2019 but reported taking his Lopressor at 100 and heart rate was in the 70's at the time of his study and SBP was in the 90s, therefore he was advised to reschedule his Coronary CT.    Past Medical History:  Diagnosis Date  . Back pain, chronic   . CHF (congestive heart failure) (HCC)   . Diabetes mellitus   . Nonischemic cardiomyopathy (HCC)    a. EF 40% by echo in 2003 b.  03/2016: cath showing normal cors and EF of 25-35%, EF 40-45% by echo with diffuse hypokinesis.    Past Surgical History:  Procedure Laterality Date  . ANKLE SURGERY    . APPENDECTOMY    . BACK SURGERY     x3  . CARDIAC CATHETERIZATION  2002  . CARDIAC CATHETERIZATION N/A 04/08/2016   Procedure: Left Heart Cath and Coronary Angiography;  Surgeon: Peter M Swaziland, MD;  Location: [facility] INVASIVE CV LAB;  Service: Cardiovascular;  Laterality: N/A;  . CHOLECYSTECTOMY    . ELBOW SURGERY    . ICD IMPLANT  01/26/2018   SJM Fortify Assura VR ICD implanted by Dr Marily Memos for primary prevention of sudden death  . THUMB FUSION      Current Medications: Outpatient Medications Prior to Visit  Medication Sig Dispense Refill  . aspirin 325 MG tablet Take 325 mg by mouth daily.    [name] atorvastatin (LIPITOR) 20 MG tablet Take 1 tablet (20 mg total) by mouth daily. 90 tablet 3  . Cholecalciferol (VITAMIN D3) 5000 units CAPS Take 1 capsule by mouth daily.    . furosemide (LASIX) 20 MG tablet Take 2 tablets (40 mg total) by mouth every morning. and 20 mg in the evening 270 tablet 0  . glipiZIDE (GLUCOTROL) 5 MG tablet Take 5 mg by mouth 2 (two) times daily before a meal.      . metFORMIN (GLUCOPHAGE) 500 MG tablet Take 500  mg by mouth 2 (two) times daily with a meal.      . metoprolol succinate (TOPROL XL) 25 MG 24 hr tablet Take 1 tablet (25 mg total) by mouth 2 (two) times daily. 45 tablet 0  . nitroGLYCERIN (NITROSTAT) 0.4 MG SL tablet Place 1 tablet (0.4 mg total) under the tongue every 5 (five) minutes as needed for chest pain. 25 tablet 3  . sacubitril-valsartan (ENTRESTO) 24-26 MG Take 1 tablet by mouth 2 (two) times daily. 180 tablet 3  . spironolactone (ALDACTONE) 25 MG tablet Take 25 mg by mouth daily. Prescribed by [place] and pt reports on 11/30/2018 that he takes daily.    . TRAZODONE HCL PO Take 100 mg by mouth at bedtime.      No facility-administered medications prior to visit.     Allergies:    Peanuts [nuts], Meat extract, and Codeine   Social History   Socioeconomic History  . Marital status: Married    Spouse name: Not on file  . Number of children: Not on file  . Years of education: Not on file  . Highest education level: Not on file  Occupational History  . Not on file  Tobacco Use  . Smoking status: Never Smoker  . Smokeless tobacco: Never Used  Substance and Sexual Activity  . Alcohol use: No  . Drug use: No  . Sexual activity: Yes  Other Topics Concern  . Not on file  Social History Narrative   Lives in [place], [place]   Disabled   Works at [employer] as a Training and development officer   Social Determinants of Radio broadcast assistant Strain:   . Difficulty of Paying Living Expenses: Not on file  Food Insecurity:   . Worried About Charity fundraiser in the Last Year: Not on file  . Ran Out of Food in the Last Year: Not on file  Transportation Needs:   . Lack of Transportation (Medical): Not on file  . Lack of Transportation (Non-Medical): Not on file  Physical Activity:   . Days of Exercise per Week: Not on file  . Minutes of Exercise per Session: Not on file  Stress:   . Feeling of Stress : Not on file  Social Connections:   . Frequency of Communication with Friends and Family: Not on file  . Frequency of Social Gatherings with Friends and Family: Not on file  . Attends Religious Services: Not on file  . Active Member of Clubs or Organizations: Not on file  . Attends Archivist Meetings: Not on file  . Marital Status: Not on file     Family History:  The patient's ***family history includes Healthy in his sister and sister; Leukemia in his maternal grandmother; Other in his brother; Pancreatic cancer in his father; Stroke in an other family member.   Review of Systems:   Please see the history of present illness.     General:  No chills, fever, night sweats or weight changes.  Cardiovascular:  No chest pain, dyspnea on exertion, edema, orthopnea,  palpitations, paroxysmal nocturnal dyspnea. Dermatological: No rash, lesions/masses Respiratory: No cough, dyspnea Urologic: No hematuria, dysuria Abdominal:   No nausea, vomiting, diarrhea, bright red blood per rectum, melena, or hematemesis Neurologic:  No visual changes, wkns, changes in mental status. All other systems reviewed and are otherwise negative except as noted above.   Physical Exam:    VS:  There were no vitals taken for this visit.   General: Well developed,  well nourished,male appearing in no acute distress. Head: Normocephalic, atraumatic, sclera non-icteric, no xanthomas, nares are without discharge.  Neck: No carotid bruits. JVD not elevated.  Lungs: Respirations regular and unlabored, without wheezes or rales.  Heart: ***Regular rate and rhythm. No S3 or S4.  No murmur, no rubs, or gallops appreciated. Abdomen: Soft, non-tender, non-distended with normoactive bowel sounds. No hepatomegaly. No rebound/guarding. No obvious abdominal masses. Msk:  Strength and tone appear normal for age. No joint deformities or effusions. Extremities: No clubbing or cyanosis. No edema.  Distal pedal pulses are 2+ bilaterally. Neuro: Alert and oriented X 3. Moves all extremities spontaneously. No focal deficits noted. Psych:  Responds to questions appropriately with a normal affect. Skin: No rashes or lesions noted  Wt Readings from Last 3 Encounters:  06/03/19 240 lb (108.9 kg)  04/13/19 244 lb (110.7 kg)  03/28/19 234 lb (106.1 kg)        Studies/Labs Reviewed:   EKG:  EKG is*** ordered today.  The ekg ordered today demonstrates ***  Recent Labs: 05/24/2019: BUN 20; Creatinine, Ser 1.21; Potassium 4.6; Sodium 138   Lipid Panel    Component Value Date/Time   CHOL 153 04/08/2016 0544   TRIG 224 (H) 04/08/2016 0544   HDL 32 (L) 04/08/2016 0544   CHOLHDL 4.8 04/08/2016 0544   VLDL 45 (H) 04/08/2016 0544   LDLCALC 76 04/08/2016 0544    Additional studies/ records that  were reviewed today include:   NST: 03/2019  There was no ST segment deviation noted during stress.  Defect 1: There is a medium defect of moderate severity present in the mid anterior, mid inferoseptal, mid inferior, apical anterior and apical inferior location.  Findings consistent with prior myocardial infarction with peri-infarct ischemia.  This is a high risk study.  Nuclear stress EF: 32%.  Echocardiogram: 03/2019 IMPRESSIONS    1. Left ventricular ejection fraction, by visual estimation, is 50 to 55%. The left ventricle has normal function. Normal left ventricular size. There is borderline left ventricular hypertrophy.  2. Left ventricular diastolic Doppler parameters are consistent with impaired relaxation pattern of LV diastolic filling.  3. Global right ventricle has normal systolic function.The right ventricular size is normal. No increase in right ventricular wall thickness.  4. Left atrial size was normal.  5. Right atrial size was normal.  6. The mitral valve is grossly normal. Mild mitral valve regurgitation.  7. The tricuspid valve is grossly normal. Tricuspid valve regurgitation is trivial.  8. The aortic valve is tricuspid Aortic valve regurgitation was not visualized by color flow Doppler. Structurally normal aortic valve, with no evidence of sclerosis or stenosis.  9. The pulmonic valve was grossly normal. Pulmonic valve regurgitation is not visualized by color flow Doppler. 10. Mildly elevated pulmonary artery systolic pressure. 11. The inferior vena cava is normal in size with greater than 50% respiratory variability, suggesting right atrial pressure of 3 mmHg.  Assessment:    No diagnosis found.   Plan:   In order of problems listed above:  1. ***    Medication Adjustments/Labs and Tests Ordered: Current medicines are reviewed at length with the patient today.  Concerns regarding medicines are outlined above.  Medication changes, Labs and Tests  ordered today are listed in the Patient Instructions below. There are no Patient Instructions on file for this visit.   Signed, Ellsworth Lennox, PA-C  06/15/2019 11:05 AM    [facility] [address] [place], [place] [address] Phone: [phone] Fax: [phone]

## 2019-06-15 NOTE — Telephone Encounter (Signed)
Left message on voicemail with name and callback number Aricka Goldberger RN Navigator Cardiac Imaging [facility] [phone] Office [phone] Cell  

## 2019-06-16 ENCOUNTER — Other Ambulatory Visit: Payer: Self-pay

## 2019-06-16 ENCOUNTER — Ambulatory Visit
Admission: RE | Admit: 2019-06-16 | Discharge: 2019-06-16 | Disposition: A | Discharge status: Home / Self Care | Payer: No Typology Code available for payment source | Source: Ambulatory Visit | Attending: Student | Admitting: Student

## 2019-06-16 DIAGNOSIS — I209 Angina pectoris, unspecified: Secondary | ICD-10-CM | POA: Insufficient documentation

## 2019-06-16 DIAGNOSIS — R0600 Dyspnea, unspecified: Secondary | ICD-10-CM | POA: Diagnosis not present

## 2019-06-16 DIAGNOSIS — I2510 Atherosclerotic heart disease of native coronary artery without angina pectoris: Secondary | ICD-10-CM

## 2019-06-16 MED ORDER — NITROGLYCERIN 0.4 MG SL SUBL
0.8000 mg | SUBLINGUAL_TABLET | Freq: Once | SUBLINGUAL | Status: AC
  Administered 2019-06-16: 0.8 mg via SUBLINGUAL

## 2019-06-16 MED ORDER — METOPROLOL TARTRATE 5 MG/5ML IV SOLN
5.0000 mg | INTRAVENOUS | Status: DC | PRN
  Administered 2019-06-16 (×2): 5 mg via INTRAVENOUS

## 2019-06-16 MED ORDER — IOHEXOL 350 MG/ML SOLN
100.0000 mL | Freq: Once | INTRAVENOUS | Status: AC | PRN
  Administered 2019-06-16: 100 mL via INTRAVENOUS

## 2019-06-16 MED ORDER — METOPROLOL TARTRATE 5 MG/5ML IV SOLN
10.0000 mg | Freq: Once | INTRAVENOUS | Status: AC
  Administered 2019-06-16: 10 mg via INTRAVENOUS

## 2019-06-16 NOTE — Progress Notes (Signed)
Spoke with doctor regarding patient's heart rate. Ordered Metoprolol 10mg  IVP once.

## 2019-06-16 NOTE — Progress Notes (Signed)
Patient tolerated CT without incident. Did not want anything to eat or drink. Ambulatory to exit steady gait.  

## 2019-06-17 DIAGNOSIS — I2510 Atherosclerotic heart disease of native coronary artery without angina pectoris: Secondary | ICD-10-CM | POA: Diagnosis not present

## 2019-06-21 ENCOUNTER — Ambulatory Visit (INDEPENDENT_AMBULATORY_CARE_PROVIDER_SITE_OTHER): Payer: Medicare Other | Admitting: Student

## 2019-06-21 ENCOUNTER — Other Ambulatory Visit: Payer: Self-pay

## 2019-06-21 ENCOUNTER — Encounter: Payer: Self-pay | Admitting: Student

## 2019-06-21 VITALS — BP 110/67 | HR 79 | Temp 97.1°F | Ht 71.0 in | Wt 242.0 lb

## 2019-06-21 DIAGNOSIS — I10 Essential (primary) hypertension: Secondary | ICD-10-CM

## 2019-06-21 DIAGNOSIS — I5022 Chronic systolic (congestive) heart failure: Secondary | ICD-10-CM | POA: Diagnosis not present

## 2019-06-21 DIAGNOSIS — I209 Angina pectoris, unspecified: Secondary | ICD-10-CM | POA: Diagnosis not present

## 2019-06-21 DIAGNOSIS — E785 Hyperlipidemia, unspecified: Secondary | ICD-10-CM

## 2019-06-21 MED ORDER — FUROSEMIDE 20 MG PO TABS: 40.0000 mg | ORAL_TABLET | Freq: Every day | ORAL | 0 refills | Status: AC

## 2019-06-21 NOTE — Progress Notes (Signed)
Cardiology Office Note    Date:  06/21/2019   ID:  Dalton Porter, DOB July 29, 1955, MRN [id number]  [facility]P:  [facility]  Cardiologist: Dalton Dolly, MD    Chief Complaint  Patient presents with   Follow-up    recent Coronary CT    History of Present Illness:    Dalton Porter is a 63 y.o. male with past medical history of chronic systolic CHF/ NICM (EF 68-34% in 2017 with cath showing normal cors, s/p ICD placement in 12/2017), HTN, Type 2 DM and history of CVA who presents to the office today for 43-month follow-up.  He was last examined by myself following his recent stress test which has shown amedium defect of moderate severity present in the mid anterior, mid inferoseptal, mid inferior, apical anterior and apical inferior locationand findings were overallconsistent with prior myocardial infarction with peri-infarct ischemia. He reported still having dyspnea on exertion at the time of his visit and noted intermittent episodes of chest pain which could occur at rest or with activity. Given his stress test findings and symptoms, options were reviewed with the DOD given his prior normal catheterization in 2017 and it was recommended to proceed with a Coronary CT for further evaluation.  He did call the office in 05/2019 reporting discomfort along his device and was evaluated in the device clinic on 05/17/2019 and this was found to be functioning normally. He was noted to have nodular tissue over his device and his pain was overall thought to be most consistent with musculoskeletal discomfort.   Coronary CT was performed on 06/16/2019 and showed no significant extracardiac abnormalities. He did have moderate nonobstructive calcified plaque along the proximal RCA and distal LAD. This was sent for CT FFR for further evaluation. FFR showed no significant stenosis along the RCA and no significant stenosis along the proximal or mid LAD. He did have stenosis along the very  distal LAD along with distal OM1 and OM 2 which were overall very small in caliber (< 2 mm). Guideline directed medical therapy was recommended.   In talking with the patient today, he denies any recurrent episodes of exertional chest pain. He does describe an occasional aching sensation along his left pectoral region which occurs when he bends down to pick up an object. He has not had to utilize sublingual nitroglycerin within several months. No specific orthopnea, PND or lower extremity edema.    Past Medical History:  Diagnosis Date   Back pain, chronic    CHF (congestive heart failure) (HCC)    Diabetes mellitus    Nonischemic cardiomyopathy ([place])    a. EF 40% by echo in 2003 b. 03/2016: cath showing normal cors and EF of 25-35%, EF 40-45% by echo with diffuse hypokinesis.    Past Surgical History:  Procedure Laterality Date   ANKLE SURGERY     APPENDECTOMY     BACK SURGERY     x3   CARDIAC CATHETERIZATION  2002   CARDIAC CATHETERIZATION N/A 04/08/2016   Procedure: Left Heart Cath and Coronary Angiography;  Surgeon: Dalton M Martinique, MD;  Location: [facility];  Service: Cardiovascular;  Laterality: N/A;   CHOLECYSTECTOMY     ELBOW SURGERY     ICD IMPLANT  01/26/2018   SJM Fortify Assura VR ICD implanted by Dr [name] for primary prevention of sudden death   THUMB FUSION      Current Medications: Outpatient Medications Prior to Visit  Medication Sig Dispense Refill  aspirin 325 MG tablet Take 325 mg by mouth daily.     atorvastatin (LIPITOR) 20 MG tablet Take 1 tablet (20 mg total) by mouth daily. 90 tablet 3   Cholecalciferol (VITAMIN D3) 5000 units CAPS Take 1 capsule by mouth daily.     glipiZIDE (GLUCOTROL) 5 MG tablet Take 5 mg by mouth 2 (two) times daily before a meal.       metFORMIN (GLUCOPHAGE) 500 MG tablet Take 500 mg by mouth 2 (two) times daily with a meal.       metoprolol succinate (TOPROL XL) 25 MG 24 hr tablet Take 1 tablet (25 mg  total) by mouth 2 (two) times daily. 45 tablet 0   nitroGLYCERIN (NITROSTAT) 0.4 MG SL tablet Place 1 tablet (0.4 mg total) under the tongue every 5 (five) minutes as needed for chest pain. 25 tablet 3   sacubitril-valsartan (ENTRESTO) 24-26 MG Take 1 tablet by mouth 2 (two) times daily. 180 tablet 3   spironolactone (ALDACTONE) 25 MG tablet Take 25 mg by mouth daily. [facility] by [facility] and pt reports on 11/30/2018 that he takes daily.     TRAZODONE HCL PO Take 100 mg by mouth at bedtime.      furosemide (LASIX) 20 MG tablet Take 2 tablets (40 mg total) by mouth every morning. and 20 mg in the evening 270 tablet 0   No facility-administered medications prior to visit.     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   Social Determinants of Corporate investment banker Strain:    Difficulty of Paying Living Expenses: Not on file  Food Insecurity:    Worried About Programme researcher, broadcasting/film/video in the Last Year: Not on file   [employer] of Food in the Last Year: Not on file  Transportation Needs:    Lack of Transportation (Medical): Not on file   Lack of Transportation (Non-Medical): Not on file  Physical Activity:    Days of Exercise per Week: Not on file   Minutes of Exercise per Session: Not on file  Stress:    Feeling of Stress : Not on file  Social Connections:    Frequency of Communication with Friends and Family: Not on  file   Frequency of Social Gatherings with Friends and Family: Not on file   Attends Religious Services: Not on file   Active Member of Clubs or Organizations: Not on file   Attends Banker Meetings: Not on file   Marital Status: Not on file     Family History:  The patient's family history includes Healthy in his sister and sister; Leukemia in his maternal grandmother; Other in his brother; Pancreatic cancer in his father; Stroke in an other family member.   Review of Systems:   Please see  the history of present illness.     General:  No chills, fever, night sweats or weight changes.  Cardiovascular:  No dyspnea on exertion, edema, orthopnea, palpitations, paroxysmal nocturnal dyspnea. Positive for chest pain.  Dermatological: No rash, lesions/masses Respiratory: No cough, dyspnea Urologic: No hematuria, dysuria Abdominal:   No nausea, vomiting, diarrhea, bright red blood per rectum, melena, or hematemesis Neurologic:  No visual changes, wkns, changes in mental status. All other systems reviewed and are otherwise negative except as noted above.   Physical Exam:    VS:  BP 110/67    Pulse 79    Temp (!) 97.1 F (36.2 C) (Skin)    Ht 5\' 11"  (1.803 m)    Wt 242 lb (109.8 kg)    SpO2 96%    BMI 33.75 kg/m    General: Well developed, well nourished,male appearing in no acute distress. Head: Normocephalic, atraumatic, sclera non-icteric, no xanthomas, nares are without discharge.  Neck: No carotid bruits. JVD not elevated.  Lungs: Respirations regular and unlabored, without wheezes or rales.  Heart: Regular rate and rhythm. No S3 or S4.  No murmur, no rubs, or gallops appreciated. Abdomen: Soft, non-tender, non-distended with normoactive bowel sounds. No hepatomegaly. No rebound/guarding. No obvious abdominal masses. Msk:  Strength and tone appear normal for age. No joint deformities or effusions. Extremities: No clubbing or cyanosis. No lower extremity edema.  Distal  pedal pulses are 2+ bilaterally. Neuro: Alert and oriented X 3. Moves all extremities spontaneously. No focal deficits noted. Psych:  Responds to questions appropriately with a normal affect. Skin: No rashes or lesions noted  Wt Readings from Last 3 Encounters:  06/21/19 242 lb (109.8 kg)  06/03/19 240 lb (108.9 kg)  04/13/19 244 lb (110.7 kg)     Studies/Labs Reviewed:   EKG:  EKG is not ordered today.   Recent Labs: 05/24/2019: BUN 20; Creatinine, Ser 1.21; Potassium 4.6; Sodium 138   Lipid Panel    Component Value Date/Time   CHOL 153 04/08/2016 0544   TRIG 224 (H) 04/08/2016 0544   HDL 32 (L) 04/08/2016 0544   CHOLHDL 4.8 04/08/2016 0544   VLDL 45 (H) 04/08/2016 0544   LDLCALC 76 04/08/2016 0544    Additional studies/ records that were reviewed today include:   Echocardiogram: 03/2019 IMPRESSIONS    1. Left ventricular ejection fraction, by visual estimation, is 50 to 55%. The left ventricle has normal function. Normal left ventricular size. There is borderline left ventricular hypertrophy.  2. Left ventricular diastolic Doppler parameters are consistent with impaired relaxation pattern of LV diastolic filling.  3. Global right ventricle has normal systolic function.The right ventricular size is normal. No increase in right ventricular wall thickness.  4. Left atrial size was normal.  5. Right atrial size was normal.  6. The mitral valve is grossly normal. Mild mitral valve regurgitation.  7. The tricuspid valve is grossly normal. Tricuspid valve regurgitation is trivial.  8. The aortic valve is tricuspid Aortic valve regurgitation was not visualized by color flow Doppler. Structurally normal aortic valve, with no evidence of sclerosis or stenosis.  9. The pulmonic valve was grossly normal. Pulmonic valve regurgitation is not visualized by color flow Doppler. 10. Mildly elevated pulmonary artery systolic pressure. 11. The inferior vena cava is normal in size with  greater than 50% respiratory variability, suggesting right atrial pressure of 3 mmHg.  Coronary CT: 05/2019  IMPRESSION: 1. Coronary calcium score of 198. This was 6876 percentile for age and sex matched  control.  2. Normal coronary origin with right dominance.  3. Moderate non obstructive calcified plaque in the proximal RCA and distal LAD.  4. CAD-RADS 3. Moderate stenosis. Consider symptom-guided anti-ischemic pharmacotherapy as well as risk factor modification per guideline directed care. Additional analysis with CT FFR will be submitted.  1. Left Main:  No significant stenosis.  2. LAD: No significant stenosis in the proximal and mid vessel. 0.78 in the distal vessel ,which is small (<1.67mm). 3. LCX: No significant stenosis in the LCx. Distal OM1 and distal OM2 branch with 0.75 and 0.72 branches. Distal OM branches are small vessels (<1.34mm) 4. RCA: No significant stenosis  IMPRESSION: 1. CT FFR analysis did show stenosis in the distal LAD (0.78), distal OM1 and OM2 (0.75 and 0.72 respectively).  2.  All vessels with stenosis were of small caliber (<36mm).  3.  Recommend guideline directed medical therapy.  Assessment:    1. Angina pectoris (HCC)   2. Chronic systolic heart failure (HCC)   3. Essential hypertension   4. Hyperlipidemia LDL goal <70      Plan:   In order of problems listed above:  1. Chest Pain with Mixed Features - Most recent Coronary CT from this month showed CAD along the distal LAD and distal OM1 and OM 2 which were overall small in caliber and guideline directed medical therapy was recommended. He denies any recurrent episodes of exertional chest pain and has not had to utilize sublingual nitroglycerin within the past several months. He is already on ASA (325mg  daily dosing per Neurology), beta-blocker and statin therapy. We reviewed that if he did have recurrent episodes of angina, would consider the use of Imdur.  2. Chronic Systolic  CHF/ NICM - EF was previously reduced at 25 to 35% by cardiac catheterization in 2017 and he underwent ICD placement in 2019. Most recent echocardiogram showed his EF has now improved to 50 to 55%. ICD followed by Dr. 2020 and most recent interrogation earlier this month showed normal device function.  - He denies any recent orthopnea, PND or edema. Appears euvolemic by examination. Continue current medication regimen with Entresto, Toprol-XL, Spironolactone, and Lasix 40 mg daily.  3. HTN - BP is well controlled at 110/67 during today's visit. Continue current regimen with Entresto 24-26 mg twice daily, Toprol-XL 25 mg twice daily and Spironolactone 25 mg daily.  4. HLD - followed by PCP. He remains on Atorvastatin 20mg  daily.   Medication Adjustments/Labs and Tests Ordered: Current medicines are reviewed at length with the patient today.  Concerns regarding medicines are outlined above.  Medication changes, Labs and Tests ordered today are listed in the Patient Instructions below. Patient Instructions  Medication Instructions:  Your physician recommends that you continue on your current medications as directed. Please refer to the Current Medication list given to you today.   Labwork: NONE  Testing/Procedures: NONE  Follow-Up: Your physician wants you to follow-up in: 6 MONTHS.  You will receive a reminder letter in the mail two months in advance. If you don't receive a letter, please call our office to schedule the follow-up appointment.   Any Other Special Instructions Will Be Listed Below (If Applicable).  If you need a refill on your cardiac medications before your next appointment, please call your pharmacy.    Signed, Dalton Frame, PA-C  06/21/2019 5:47 PM    [facility] [address] [place], [address] [place] Phone: [phone] Fax: [phone]

## 2019-06-21 NOTE — Patient Instructions (Signed)

## 2019-06-30 NOTE — Progress Notes (Addendum)
Remote ICD transmission.   

## 2019-07-04 ENCOUNTER — Ambulatory Visit (INDEPENDENT_AMBULATORY_CARE_PROVIDER_SITE_OTHER): Payer: Medicare Other

## 2019-07-04 DIAGNOSIS — I5022 Chronic systolic (congestive) heart failure: Secondary | ICD-10-CM

## 2019-07-04 DIAGNOSIS — Z95810 Presence of automatic (implantable) cardiac defibrillator: Secondary | ICD-10-CM | POA: Diagnosis not present

## 2019-07-05 NOTE — Progress Notes (Signed)
EPIC Encounter for ICM Monitoring  Patient Name: Dalton Porter is a 64 y.o. male Date: 07/05/2019 Primary Care Physican: [facility] Primary Cardiologist:Branch Electrophysiologist:Allred 07/05/2019 Weight:240 - 244lbs   Spoke with patient and he is asymptomatic for fluid accumulation.  CorvueThoracic impedancenormal.    Prescribed: Furosemide20 mgtake2 tablets (40 mgtotal)in the morning and 20 mg in the evening.   Labs: 05/24/2019 Creatinine 1.21, BUN 20, Potassium 4.6, Sodium 138, GFR >60 A complete set of results can be found in Results Review.  Recommendations: No changes and encouraged to call if experiencing any fluid symptoms.  Follow-up plan: ICM clinic phone appointment on2/01/2020. 91 day device clinic remote transmission 08/29/2019.   Copy of ICM check sent to Dr.Allred   3 month ICM trend: 07/04/2019    1 Year ICM trend:       Karie Soda, RN 07/05/2019 2:24 PM

## 2019-08-08 ENCOUNTER — Ambulatory Visit (INDEPENDENT_AMBULATORY_CARE_PROVIDER_SITE_OTHER): Payer: Medicare Other

## 2019-08-08 DIAGNOSIS — I5022 Chronic systolic (congestive) heart failure: Secondary | ICD-10-CM | POA: Diagnosis not present

## 2019-08-08 DIAGNOSIS — Z95810 Presence of automatic (implantable) cardiac defibrillator: Secondary | ICD-10-CM | POA: Diagnosis not present

## 2019-08-12 ENCOUNTER — Telehealth: Payer: Self-pay

## 2019-08-12 NOTE — Telephone Encounter (Signed)
Remote ICM transmission received.  Attempted call to patient regarding ICM remote transmission and left message to return call   

## 2019-08-12 NOTE — Progress Notes (Signed)
EPIC Encounter for ICM Monitoring  Patient Name: Dalton Porter is a 64 y.o. male Date: 08/12/2019 Primary Care Physican: [facility] Primary Cardiologist:Branch Electrophysiologist:Allred 07/05/2019 Weight:240 - 244lbs   Attempted call to patient and unable to reach.  Left message to return call. Transmission reviewed.   CorvueThoracic impedancenormal.    Prescribed: Furosemide20 mgtake2 tablets (40 mgtotal)in the morning and 20 mg in the evening.   Labs: 05/24/2019 Creatinine 1.21, BUN 20, Potassium 4.6, Sodium 138, GFR >60 A complete set of results can be found in Results Review.  Recommendations: Unable to reach.    Follow-up plan: ICM clinic phone appointment on3/16/2021. 91 day device clinic remote transmission 09/12/2019.   Copy of ICM check sent to Dr.Allred   3 month ICM trend: 08/08/2019    1 Year ICM trend:       Karie Soda, RN 08/12/2019 9:48 AM

## 2019-09-13 NOTE — Progress Notes (Signed)
No ICM remote transmission received for 09/13/2019 and next ICM transmission scheduled for 09/26/2019.   

## 2019-09-14 ENCOUNTER — Telehealth: Payer: Self-pay

## 2019-09-14 NOTE — Telephone Encounter (Signed)
Left message for patient to remind of missed remote transmission.  

## 2019-09-26 ENCOUNTER — Ambulatory Visit (INDEPENDENT_AMBULATORY_CARE_PROVIDER_SITE_OTHER): Payer: Medicare Other

## 2019-09-26 DIAGNOSIS — I5022 Chronic systolic (congestive) heart failure: Secondary | ICD-10-CM

## 2019-09-26 DIAGNOSIS — Z95810 Presence of automatic (implantable) cardiac defibrillator: Secondary | ICD-10-CM

## 2019-09-30 NOTE — Progress Notes (Signed)
EPIC Encounter for ICM Monitoring  Patient Name: Dalton Porter is a 64 y.o. male Date: 09/30/2019 Primary Care Physican: [facility] Primary Cardiologist:Branch Electrophysiologist:Allred 1/5/2021Weight:240 [phone]   Transmission reviewed.   CorvueThoracic impedancenormal.  Prescribed: Furosemide20 mgtake2 tablets (40 mgtotal)in the morning and 20 mg in the evening.   Labs: 05/24/2019 Creatinine 1.21, BUN 20, Potassium 4.6, Sodium 138, GFR >60 A complete set of results can be found in Results Review.  Recommendations: None   Follow-up plan: ICM clinic phone appointment on5/09/2019. 91 day device clinic remote transmission 12/12/2019.  3 month ICM trend: 09/26/2019    1 Year ICM trend:       Karie Soda, RN 09/30/2019 1:41 PM

## 2019-11-01 ENCOUNTER — Ambulatory Visit (INDEPENDENT_AMBULATORY_CARE_PROVIDER_SITE_OTHER): Payer: Medicare Other

## 2019-11-01 ENCOUNTER — Telehealth: Payer: Self-pay

## 2019-11-01 DIAGNOSIS — I5022 Chronic systolic (congestive) heart failure: Secondary | ICD-10-CM | POA: Diagnosis not present

## 2019-11-01 DIAGNOSIS — Z95810 Presence of automatic (implantable) cardiac defibrillator: Secondary | ICD-10-CM | POA: Diagnosis not present

## 2019-11-01 NOTE — Telephone Encounter (Signed)
Left message for patient to remind of missed remote transmission.  

## 2019-11-02 NOTE — Progress Notes (Signed)
EPIC Encounter for ICM Monitoring  Patient Name: Dalton Porter is a 64 y.o. male Date: 11/02/2019 Primary [facility]: [facility] Primary Cardiologist:Branch Electrophysiologist:Allred 5/5/2021Weight:230lbs   Spoke with patient and reports feeling well at this time.  Denies fluid symptoms.    CorvueThoracic impedancenormal.Numerous decreased impedance episodes within last month suggesting possible fluid accumulation (4/14-4/18, 4/22-4/25, 4/27-5/2).  Prescribed: Furosemide20 mgtake2 tablets (40 mgtotal)daily.   Labs: 05/24/2019 Creatinine 1.21, BUN 20, Potassium 4.6, Sodium 138, GFR >60 A complete set of results can be found in Results Review.  Recommendations: Recommendation to limit salt intake to 2000 mg daily and fluid intake to 64 oz daily.  Encouraged to call if experiencing any fluid symptoms.   Follow-up plan: ICM clinic phone appointment on6/15/2021. 91 day device clinic remote transmission 12/12/2019.  3 month ICM trend: 11/01/2019    1 Year ICM trend:       Karie Soda, RN 11/02/2019 8:57 AM

## 2019-12-13 ENCOUNTER — Encounter: Payer: Self-pay | Admitting: Family Medicine

## 2019-12-13 ENCOUNTER — Ambulatory Visit (INDEPENDENT_AMBULATORY_CARE_PROVIDER_SITE_OTHER): Payer: Medicare Other | Admitting: Family Medicine

## 2019-12-13 ENCOUNTER — Other Ambulatory Visit: Payer: Self-pay

## 2019-12-13 ENCOUNTER — Telehealth: Payer: Self-pay

## 2019-12-13 ENCOUNTER — Ambulatory Visit (INDEPENDENT_AMBULATORY_CARE_PROVIDER_SITE_OTHER): Payer: Medicare Other

## 2019-12-13 VITALS — BP 126/76 | HR 70 | Ht 71.0 in | Wt 243.0 lb

## 2019-12-13 DIAGNOSIS — I10 Essential (primary) hypertension: Secondary | ICD-10-CM

## 2019-12-13 DIAGNOSIS — I5022 Chronic systolic (congestive) heart failure: Secondary | ICD-10-CM

## 2019-12-13 DIAGNOSIS — E785 Hyperlipidemia, unspecified: Secondary | ICD-10-CM | POA: Diagnosis not present

## 2019-12-13 DIAGNOSIS — Z95810 Presence of automatic (implantable) cardiac defibrillator: Secondary | ICD-10-CM

## 2019-12-13 DIAGNOSIS — R0789 Other chest pain: Secondary | ICD-10-CM

## 2019-12-13 NOTE — Telephone Encounter (Signed)
Remote ICM transmission received.  Attempted call to patient regarding ICM remote transmission and left message to return call   

## 2019-12-13 NOTE — Progress Notes (Signed)
Cardiology Office Note  Date: 12/13/2019   ID: Dalton Porter, DOB 02/18/1956, MRN [id number]  PCP:  [facility]  Cardiologist:  Dina Rich, MD Electrophysiologist:  Hillis Range, MD   Chief Complaint: Follow-up chronic systolic CHF/ NICM (EF 25-35 in 2017 with cath showing normal coronaries, status post ICD placement 03/06/2018), hypertension, type II DM, history of CVA  History of Present Illness: Dalton Porter is a 64 y.o. male with a history of chronic systolic CHF/Porter ICM (EF 25-35 in 2017 with cath showing normal coronaries, status post ICD placement 03/06/2018), hypertension, type II DM, history of CVA.  Last seen by Dalton An, PA 06/19/2019.  Patient had previously had a stress test showing abnormalities.  He was continuing to have dyspnea and intermittent chest pain with rest or activity.  She discussed findings with DOD who recommended a coronary CT.  CT performed on 06/16/2019 showed no significant extracardiac abnormalities did have moderate nonobstructive calcified plaque along the proximal RCA and distal LAD.  This was sent for [facility] for further evaluation FFR showed no significant stenosis along the RCA and no significant stenosis along the proximal or mid LAD.  He did have some stenosis along the very distal LAD along with distal OM1 and OM 2 which are very small in caliber.  Medical therapy recommended.  At his recent visit with he denied any recurrent episodes of exertional chest pain.  Described occasional aching sensation along his left pectoral region occurring when he bent down to pick up Porter object.  He had not used any nitroglycerin for several months denied any PND orthopnea or lower extremity edema.   Past Medical History:  Diagnosis Date  . Back pain, chronic   . CHF (congestive heart failure) (HCC)   . Diabetes mellitus   . Nonischemic cardiomyopathy (HCC)    a. EF 40% by echo in 2003 b. 03/2016: cath showing normal cors and EF of 25-35%, EF  40-45% by echo with diffuse hypokinesis.    Past Surgical History:  Procedure Laterality Date  . ANKLE SURGERY    . APPENDECTOMY    . BACK SURGERY     x3  . CARDIAC CATHETERIZATION  2002  . CARDIAC CATHETERIZATION N/A 04/08/2016   Procedure: Left Heart Cath and Coronary Angiography;  Surgeon: Peter M Swaziland, MD;  Location: [facility] INVASIVE CV LAB;  Service: Cardiovascular;  Laterality: N/A;  . CHOLECYSTECTOMY    . ELBOW SURGERY    . ICD IMPLANT  01/26/2018   SJM Fortify Assura VR ICD implanted by Dr Marily Memos for primary prevention of sudden death  . THUMB FUSION      Current Outpatient Medications  Medication Sig Dispense Refill  . aspirin 325 MG tablet Take 325 mg by mouth daily.    [name] atorvastatin (LIPITOR) 20 MG tablet Take 1 tablet (20 mg total) by mouth daily. 90 tablet 3  . Cholecalciferol (VITAMIN D3) 5000 units CAPS Take 1 capsule by mouth daily.    . furosemide (LASIX) 20 MG tablet Take 2 tablets (40 mg total) by mouth daily. 270 tablet 0  . glipiZIDE (GLUCOTROL) 5 MG tablet Take 5 mg by mouth 2 (two) times daily before a meal.      . metFORMIN (GLUCOPHAGE) 500 MG tablet Take 500 mg by mouth 2 (two) times daily with a meal.      . metoprolol succinate (TOPROL XL) 25 MG 24 hr tablet Take 1 tablet (25 mg total) by mouth 2 (two) times daily.  45 tablet 0  . nitroGLYCERIN (NITROSTAT) 0.4 MG SL tablet Place 1 tablet (0.4 mg total) under the tongue every 5 (five) minutes as needed for chest pain. 25 tablet 3  . sacubitril-valsartan (ENTRESTO) 24-26 MG Take 1 tablet by mouth 2 (two) times daily. 180 tablet 3  . spironolactone (ALDACTONE) 25 MG tablet Take 25 mg by mouth daily. Prescribed by [place] and pt reports on 11/30/2018 that he takes daily.    . TRAZODONE HCL PO Take 100 mg by mouth at bedtime.      No current facility-administered medications for this visit.   Allergies:  Peanuts [nuts], Meat extract, and Codeine   Social History: The patient  reports that he has never smoked. He has  never used smokeless tobacco. He reports that he does not drink alcohol and does not use drugs.   Family History: The patient's family history includes Healthy in his sister and sister; Leukemia in his maternal grandmother; Other in his brother; Pancreatic cancer in his father; Stroke in Porter other family member.   ROS:  Please see the history of present illness. Otherwise, complete review of systems is positive for none.  All other systems are reviewed and negative.   Physical Exam: VS:  BP 126/76 (BP Location: Right Arm, Cuff Size: Large)   Pulse 70   Ht 5\' 11"  (1.803 m)   Wt 243 lb (110.2 kg)   SpO2 98%   BMI 33.89 kg/m , BMI Body mass index is 33.89 kg/m.  Wt Readings from Last 3 Encounters:  12/13/19 243 lb (110.2 kg)  06/21/19 242 lb (109.8 kg)  06/03/19 240 lb (108.9 kg)    General: Patient appears comfortable at rest.  Neck: Supple, no elevated JVP or carotid bruits, no thyromegaly. Lungs: Clear to auscultation, nonlabored breathing at rest. Cardiac: Regular rate and rhythm, no S3 or significant systolic murmur, no pericardial rub. Extremities: No pitting edema, distal pulses 2+. Skin: Warm and dry. Musculoskeletal: No kyphosis. Neuropsychiatric: Alert and oriented x3, affect grossly appropriate.  ECG:  Recent EKG [facility] 03/25/2019 showed normal sinus rhythm rate of 83, inferior infarct, age undetermined  Recent Labwork: 05/24/2019: BUN 20; Creatinine, Ser 1.21; Potassium 4.6; Sodium 138     Component Value Date/Time   CHOL 153 04/08/2016 0544   TRIG 224 (H) 04/08/2016 0544   HDL 32 (L) 04/08/2016 0544   CHOLHDL 4.8 04/08/2016 0544   VLDL 45 (H) 04/08/2016 0544   LDLCALC 76 04/08/2016 0544    Other Studies Reviewed Today:  Echocardiogram: 03/2019 IMPRESSIONS 1. Left ventricular ejection fraction, by visual estimation, is 50 to 55%. The left ventricle has normal function. Normal left ventricular size. There is borderline left ventricular  hypertrophy. 2. Left ventricular diastolic Doppler parameters are consistent with impaired relaxation pattern of LV diastolic filling. 3. Global right ventricle has normal systolic function.The right ventricular size is normal. No increase in right ventricular wall thickness. 4. Left atrial size was normal. 5. Right atrial size was normal. 6. The mitral valve is grossly normal. Mild mitral valve regurgitation. 7. The tricuspid valve is grossly normal. Tricuspid valve regurgitation is trivial. 8. The aortic valve is tricuspid Aortic valve regurgitation was not visualized by color flow Doppler. Structurally normal aortic valve, with no evidence of sclerosis or stenosis. 9. The pulmonic valve was grossly normal. Pulmonic valve regurgitation is not visualized by color flow Doppler. 10. Mildly elevated pulmonary artery systolic pressure. 11. The inferior vena cava is normal in size with greater than 50% respiratory  variability, suggesting right atrial pressure of 3 mmHg.  Coronary CT: 05/2019  IMPRESSION: 1. Coronary calcium score of 198. This was 64 percentile for age and sex matched control.  2. Normal coronary origin with right dominance.  3. Moderate non obstructive calcified plaque in the proximal RCA and distal LAD.  4. CAD-RADS 3. Moderate stenosis. Consider symptom-guided anti-ischemic pharmacotherapy as well as risk factor modification per guideline directed care. Additional analysis with CT FFR will be submitted.  1. Left Main: No significant stenosis.  2. LAD: No significant stenosis in the proximal and mid vessel. 0.78 in the distal vessel ,which is small (<1.92mm). 3. LCX: No significant stenosis in the LCx. Distal OM1 and distal OM2 branch with 0.75 and 0.72 branches. Distal OM branches are small vessels (<1.42mm) 4. RCA: No significant stenosis  IMPRESSION: 1. CT FFR analysis did show stenosis in the distal LAD (0.78), distal OM1 and OM2 (0.75 and 0.72  respectively).  2. All vessels with stenosis were of small caliber (<90mm).   Assessment and Plan:  1. Atypical chest pain   2. Chronic systolic heart failure (HCC)   3. Essential hypertension   4. Hyperlipidemia LDL goal <70    1. Atypical chest pain He denies any classic anginal or exertional symptoms describing only and occasional brief non-radiating chest pain occurring moslty at rest and very short lived . Continue ASA, Toprol XL 25 mg daily, NTG SL PRN. Non obstructive disease on recent Cardiac CT with FFR's   2. Chronic systolic heart failure (HCC) Recent echocardiogram showed EF 50-55%, Borderline LVH. No significant DOE/SOB or LE edema. Continue Entresto 24/26 mg po bid, Aldactone 25 mg daily. Lasix 40 mg daily.  3. Essential hypertension He is normotensive today BP 126/76 Continue Toprol XL 25 mg daily, Aldactone 25 mg daily   4. Hyperlipidemia LDL goal <70 No recent lipid profile from PCP. Primarily gets most lab work from [place]. Continue atorvastatin 20 mg daily.   Medication Adjustments/Labs and Tests Ordered: Current medicines are reviewed at length with the patient today.  Concerns regarding medicines are outlined above.   Disposition: Follow-up with Dr Wyline Mood or APP 6 months  Signed, Rennis Harding, NP 12/13/2019 3:20 PM    [facility] [address] [place], [place], [place] [address] Phone: [phone]; Fax: [phone]

## 2019-12-13 NOTE — Progress Notes (Signed)
EPIC Encounter for ICM Monitoring  Patient Name: Dalton Porter is a 64 y.o. male Date: 12/13/2019 Primary Care Physican: [facility] Primary Cardiologist:Branch Electrophysiologist:Allred 5/5/2021Weight:230lbs   Attempted call to patient and unable to reach.  Left message to return call. Transmission reviewed.   CorvueThoracic impedancesuggesting possible fluid accumulation starting 12/08/19 but started trending back toward baseline 12/11/19.  Prescribed: Furosemide20 mgtake2 tablets (40 mgtotal)daily.   Labs: 05/24/2019 Creatinine 1.21, BUN 20, Potassium 4.6, Sodium 138, GFR >60 A complete set of results can be found in Results Review.  Recommendations: Will advise pt to increase Furosemide to 40 mg bid x 2-3 days per ICM protocol if patient is reached.   Follow-up plan: ICM clinic phone appointment on6/22/2021 to recheck fluid levels. 91 day device clinic remote transmission9/13/2021.  Copy of ICM check sent to Dr.Allredand Dr Wyline Mood for review and recommendations if needed.  3 month ICM trend: 12/13/2019    1 Year ICM trend:       Karie Soda, RN 12/13/2019 2:30 PM

## 2019-12-13 NOTE — Patient Instructions (Addendum)
Medication Instructions:    Your physician recommends that you continue on your current medications as directed. Please refer to the Current Medication list given to you today.  Labwork:  NONE  Testing/Procedures:  NONE  Follow-Up:  Your physician recommends that you schedule a follow-up appointment in: 6 months (office)  Any Other Special Instructions Will Be Listed Below (If Applicable).  If you need a refill on your cardiac medications before your next appointment, please call your pharmacy. 

## 2019-12-15 ENCOUNTER — Encounter: Payer: Self-pay | Admitting: Family Medicine

## 2019-12-20 ENCOUNTER — Ambulatory Visit (INDEPENDENT_AMBULATORY_CARE_PROVIDER_SITE_OTHER): Payer: Medicare Other

## 2019-12-20 DIAGNOSIS — I5022 Chronic systolic (congestive) heart failure: Secondary | ICD-10-CM

## 2019-12-20 DIAGNOSIS — Z95810 Presence of automatic (implantable) cardiac defibrillator: Secondary | ICD-10-CM

## 2019-12-23 ENCOUNTER — Telehealth: Payer: Self-pay

## 2019-12-23 NOTE — Progress Notes (Signed)
EPIC Encounter for ICM Monitoring  Patient Name: Dalton Porter is a 64 y.o. male Date: 12/23/2019 Primary Care Physican: [facility] Primary Cardiologist:Branch Electrophysiologist:Allred 5/5/2021Weight:230lbs   Attempted call to patient and unable to reach.  Transmission reviewed.   CorvueThoracic impedancereturned to normal but was suggesting possible fluid accumulation from 6/10-6/21.  Prescribed: Furosemide20 mgtake2 tablets (40 mgtotal)daily.   Labs: 05/24/2019 Creatinine 1.21, BUN 20, Potassium 4.6, Sodium 138, GFR >60 A complete set of results can be found in Results Review.  Recommendations:Unable to reach.    Follow-up plan: ICM clinic phone appointment on7/26/2021. 91 day device clinic remote transmission9/13/2021.  Copy of ICM check sent to Dr.Allred   3 month ICM trend: 12/20/2019    1 Year ICM trend:       Karie Soda, RN 12/23/2019 4:30 PM

## 2019-12-23 NOTE — Telephone Encounter (Signed)
Remote ICM transmission received.  Attempted call to patient regarding ICM remote transmission and no answer.  

## 2020-01-23 ENCOUNTER — Ambulatory Visit (INDEPENDENT_AMBULATORY_CARE_PROVIDER_SITE_OTHER): Payer: Medicare Other

## 2020-01-23 DIAGNOSIS — Z95810 Presence of automatic (implantable) cardiac defibrillator: Secondary | ICD-10-CM

## 2020-01-23 DIAGNOSIS — I5022 Chronic systolic (congestive) heart failure: Secondary | ICD-10-CM | POA: Diagnosis not present

## 2020-01-27 NOTE — Progress Notes (Signed)
EPIC Encounter for ICM Monitoring  Patient Name: LORIK GUO is a 64 y.o. male Date: 01/27/2020 Primary Care Physican: [facility] Primary Cardiologist:Branch Electrophysiologist:Allred 5/5/2021Weight:230lbs   Transmission reviewed.   CorvueThoracic impedancenormal.  Prescribed: Furosemide20 mgtake2 tablets (40 mgtotal)daily.   Labs: 01/10/2020 Creatinine 1.52, BUN 28, Potassium 4.7, Sodium 142, GFR 48-56, BNP 303 A complete set of results can be found in Results Review.  Recommendations:No changes  Follow-up plan: ICM clinic phone appointment on8/30/2021. 91 day device clinic remote transmission9/13/2021.   EP/Cardiology Office Visits: 06/01/2020 with Dr. Johney Frame and 06/19/2020 with Dr Wyline Mood.    Copy of ICM check sent to Dr. Johney Frame.   3 month ICM trend: 01/23/2020    1 Year ICM trend:       Karie Soda, RN 01/27/2020 2:31 PM

## 2020-02-27 ENCOUNTER — Ambulatory Visit (INDEPENDENT_AMBULATORY_CARE_PROVIDER_SITE_OTHER): Payer: Medicare Other

## 2020-02-27 DIAGNOSIS — I5022 Chronic systolic (congestive) heart failure: Secondary | ICD-10-CM | POA: Diagnosis not present

## 2020-02-27 DIAGNOSIS — Z95810 Presence of automatic (implantable) cardiac defibrillator: Secondary | ICD-10-CM

## 2020-02-28 ENCOUNTER — Encounter: Payer: Self-pay | Admitting: Cardiology

## 2020-02-29 ENCOUNTER — Telehealth: Payer: Self-pay

## 2020-02-29 NOTE — Telephone Encounter (Signed)
Remote ICM transmission received.  Attempted call to patient regarding ICM remote transmission and left message to return call   

## 2020-02-29 NOTE — Progress Notes (Signed)
EPIC Encounter for ICM Monitoring  Patient Name: Dalton Porter is a 64 y.o. male Date: 02/29/2020 Primary Care Physican: [facility] Primary Cardiologist:Branch Electrophysiologist:Allred 5/5/2021Weight:230lbs   Attempted call to patient and unable to reach.  Left message to return call. Transmission reviewed.   CorvueThoracic impedancesuggesting normal fluid levels.  Prescribed:   Furosemide20 mgtake2 tablets (40 mgtotal)daily.  Spironolactone 25 mg take 1 tablet daily  Labs: 01/10/2020 Creatinine 1.52, BUN 28, Potassium 4.7, Sodium 142, GFR 48-56, BNP 303 A complete set of results can be found in Results Review.  Recommendations:Unable to reach.    Follow-up plan: ICM clinic phone appointment on10/09/2019. 91 day device clinic remote transmission9/13/2021.   EP/Cardiology Office Visits: 06/01/2020 with Dr. Johney Frame and 06/19/2020 with Dr Wyline Mood.    Copy of ICM check sent to Dr. Johney Frame.   3 month ICM trend: 02/27/2020    1 Year ICM trend:       Karie Soda, RN 02/29/2020 10:52 AM

## 2020-03-12 ENCOUNTER — Ambulatory Visit (INDEPENDENT_AMBULATORY_CARE_PROVIDER_SITE_OTHER): Payer: Medicare Other | Admitting: *Deleted

## 2020-03-12 DIAGNOSIS — I428 Other cardiomyopathies: Secondary | ICD-10-CM

## 2020-03-13 LAB — CUP PACEART REMOTE DEVICE CHECK
Battery Remaining Longevity: 96 mo
Battery Remaining Percentage: 83 %
Battery Voltage: 3.04 V
Brady Statistic RV Percent Paced: 12 %
Date Time Interrogation Session: 20210913163533
HighPow Impedance: 78 Ohm
HighPow Impedance: 78 Ohm
Implantable Lead Implant Date: 20190730
Implantable Lead Location: 753860
Implantable Pulse Generator Implant Date: 20190730
Lead Channel Impedance Value: 380 Ohm
Lead Channel Pacing Threshold Amplitude: 0.75 V
Lead Channel Pacing Threshold Pulse Width: 0.5 ms
Lead Channel Sensing Intrinsic Amplitude: 11.5 mV
Lead Channel Setting Pacing Amplitude: 2.5 V
Lead Channel Setting Pacing Pulse Width: 0.5 ms
Lead Channel Setting Sensing Sensitivity: 0.5 mV
Pulse Gen Serial Number: 9839597

## 2020-03-14 NOTE — Progress Notes (Signed)
Remote ICD transmission.   

## 2020-04-02 ENCOUNTER — Ambulatory Visit (INDEPENDENT_AMBULATORY_CARE_PROVIDER_SITE_OTHER): Payer: Medicare Other

## 2020-04-02 DIAGNOSIS — Z95810 Presence of automatic (implantable) cardiac defibrillator: Secondary | ICD-10-CM | POA: Diagnosis not present

## 2020-04-02 DIAGNOSIS — I5022 Chronic systolic (congestive) heart failure: Secondary | ICD-10-CM | POA: Diagnosis not present

## 2020-04-03 NOTE — Progress Notes (Signed)
EPIC Encounter for ICM Monitoring  Patient Name: Dalton Porter is a 64 y.o. male Date: 04/03/2020 Primary Care Physican: [facility] Primary Cardiologist:Branch Electrophysiologist:Allred 10/5/2021Weight:230lbs   Spoke with patient and reports feeling well at this time.  Denies fluid symptoms.    CorvueThoracic impedancesuggesting normal fluid levels.  Prescribed:   Furosemide20 mgtake2 tablets (40 mgtotal)daily.  Spironolactone 25 mg take 1 tablet daily  Labs: 01/10/2020 Creatinine 1.52, BUN 28, Potassium 4.7, Sodium 142, GFR 48-56, BNP 303 Care Everywhere A complete set of results can be found in Results Review.  Recommendations:No changes and encouraged to call if experiencing any fluid symptoms.    Follow-up plan: ICM clinic phone appointment on11/01/2020. 91 day device clinic remote transmission12/13/2021.  EP/Cardiology Office Visits:06/01/2020 with Dr.Allred and 06/19/2020 with Dr Wyline Mood.   Copy of ICM check sent to Dr.Allred.   3 month ICM trend: 04/02/2020    1 Year ICM trend:       Karie Soda, RN 04/03/2020 12:58 PM

## 2020-05-07 ENCOUNTER — Ambulatory Visit (INDEPENDENT_AMBULATORY_CARE_PROVIDER_SITE_OTHER): Payer: Medicare Other

## 2020-05-07 DIAGNOSIS — Z95810 Presence of automatic (implantable) cardiac defibrillator: Secondary | ICD-10-CM | POA: Diagnosis not present

## 2020-05-07 DIAGNOSIS — I5022 Chronic systolic (congestive) heart failure: Secondary | ICD-10-CM

## 2020-05-11 ENCOUNTER — Telehealth: Payer: Self-pay

## 2020-05-11 NOTE — Progress Notes (Signed)
EPIC Encounter for ICM Monitoring  Patient Name: Dalton Porter is a 64 y.o. male Date: 05/11/2020 Primary Care Physican: [facility] Primary Cardiologist:Branch Electrophysiologist:Allred 10/5/2021Weight:230lbs   Attempted call to patient and unable to reach.  Left message to return call. Transmission reviewed.   CorvueThoracic impedancesuggestingnormalfluid levels.  Prescribed:   Furosemide20 mgtake2 tablets (40 mgtotal)daily.  Spironolactone 25 mg take 1 tablet daily  Labs: 01/10/2020 Creatinine 1.52, BUN 28, Potassium 4.7, Sodium 142, GFR 48-56, BNP 303 Care Everywhere A complete set of results can be found in Results Review.  Recommendations:Unable to reach.   Follow-up plan: ICM clinic phone appointment on12/14/2021. 91 day device clinic remote transmission12/13/2021.  EP/Cardiology Office Visits:06/01/2020 with Dr.Allred and 06/19/2020 with Dr Wyline Mood.   Copy of ICM check sent to Dr.Allred   3 month ICM trend: 05/07/2020    1 Year ICM trend:       Karie Soda, RN 05/11/2020 1:59 PM

## 2020-05-11 NOTE — Telephone Encounter (Signed)
Remote ICM transmission received.  Attempted call to patient regarding ICM remote transmission and left message to return call   

## 2020-05-28 ENCOUNTER — Other Ambulatory Visit: Payer: Self-pay

## 2020-05-28 ENCOUNTER — Emergency Department (HOSPITAL_COMMUNITY): Payer: No Typology Code available for payment source

## 2020-05-28 ENCOUNTER — Encounter (HOSPITAL_COMMUNITY): Payer: Self-pay

## 2020-05-28 ENCOUNTER — Inpatient Hospital Stay (HOSPITAL_COMMUNITY)
Admission: EM | Admit: 2020-05-28 | Discharge: 2020-05-31 | DRG: 057 | Disposition: A | Discharge status: Home / Self Care | Payer: No Typology Code available for payment source | Attending: Internal Medicine | Admitting: Internal Medicine

## 2020-05-28 DIAGNOSIS — Z20822 Contact with and (suspected) exposure to covid-19: Secondary | ICD-10-CM | POA: Diagnosis present

## 2020-05-28 DIAGNOSIS — Z91010 Allergy to peanuts: Secondary | ICD-10-CM

## 2020-05-28 DIAGNOSIS — Z823 Family history of stroke: Secondary | ICD-10-CM

## 2020-05-28 DIAGNOSIS — Z95810 Presence of automatic (implantable) cardiac defibrillator: Secondary | ICD-10-CM

## 2020-05-28 DIAGNOSIS — E1149 Type 2 diabetes mellitus with other diabetic neurological complication: Secondary | ICD-10-CM | POA: Diagnosis present

## 2020-05-28 DIAGNOSIS — Z981 Arthrodesis status: Secondary | ICD-10-CM

## 2020-05-28 DIAGNOSIS — E1140 Type 2 diabetes mellitus with diabetic neuropathy, unspecified: Secondary | ICD-10-CM | POA: Diagnosis present

## 2020-05-28 DIAGNOSIS — D649 Anemia, unspecified: Secondary | ICD-10-CM | POA: Diagnosis present

## 2020-05-28 DIAGNOSIS — R270 Ataxia, unspecified: Secondary | ICD-10-CM | POA: Diagnosis not present

## 2020-05-28 DIAGNOSIS — Z7984 Long term (current) use of oral hypoglycemic drugs: Secondary | ICD-10-CM

## 2020-05-28 DIAGNOSIS — Z79891 Long term (current) use of opiate analgesic: Secondary | ICD-10-CM

## 2020-05-28 DIAGNOSIS — I428 Other cardiomyopathies: Secondary | ICD-10-CM

## 2020-05-28 DIAGNOSIS — L97509 Non-pressure chronic ulcer of other part of unspecified foot with unspecified severity: Secondary | ICD-10-CM | POA: Diagnosis present

## 2020-05-28 DIAGNOSIS — G8929 Other chronic pain: Secondary | ICD-10-CM | POA: Diagnosis present

## 2020-05-28 DIAGNOSIS — I10 Essential (primary) hypertension: Secondary | ICD-10-CM | POA: Diagnosis not present

## 2020-05-28 DIAGNOSIS — E1151 Type 2 diabetes mellitus with diabetic peripheral angiopathy without gangrene: Secondary | ICD-10-CM | POA: Diagnosis present

## 2020-05-28 DIAGNOSIS — R531 Weakness: Secondary | ICD-10-CM

## 2020-05-28 DIAGNOSIS — I110 Hypertensive heart disease with heart failure: Secondary | ICD-10-CM | POA: Diagnosis present

## 2020-05-28 DIAGNOSIS — E669 Obesity, unspecified: Secondary | ICD-10-CM | POA: Diagnosis present

## 2020-05-28 DIAGNOSIS — N179 Acute kidney failure, unspecified: Secondary | ICD-10-CM | POA: Diagnosis present

## 2020-05-28 DIAGNOSIS — I5032 Chronic diastolic (congestive) heart failure: Secondary | ICD-10-CM | POA: Diagnosis present

## 2020-05-28 DIAGNOSIS — G8194 Hemiplegia, unspecified affecting left nondominant side: Principal | ICD-10-CM | POA: Diagnosis present

## 2020-05-28 DIAGNOSIS — Z7982 Long term (current) use of aspirin: Secondary | ICD-10-CM

## 2020-05-28 DIAGNOSIS — Z9049 Acquired absence of other specified parts of digestive tract: Secondary | ICD-10-CM

## 2020-05-28 DIAGNOSIS — Z79899 Other long term (current) drug therapy: Secondary | ICD-10-CM

## 2020-05-28 DIAGNOSIS — R519 Headache, unspecified: Secondary | ICD-10-CM

## 2020-05-28 DIAGNOSIS — R42 Dizziness and giddiness: Secondary | ICD-10-CM

## 2020-05-28 DIAGNOSIS — Z6833 Body mass index (BMI) 33.0-33.9, adult: Secondary | ICD-10-CM

## 2020-05-28 DIAGNOSIS — E11621 Type 2 diabetes mellitus with foot ulcer: Secondary | ICD-10-CM | POA: Diagnosis present

## 2020-05-28 DIAGNOSIS — Z91018 Allergy to other foods: Secondary | ICD-10-CM

## 2020-05-28 DIAGNOSIS — Z885 Allergy status to narcotic agent status: Secondary | ICD-10-CM

## 2020-05-28 DIAGNOSIS — I255 Ischemic cardiomyopathy: Secondary | ICD-10-CM | POA: Diagnosis present

## 2020-05-28 HISTORY — DX: Cerebral infarction, unspecified: I63.9

## 2020-05-28 LAB — I-STAT CHEM 8, ED
BUN: 21 mg/dL (ref 8–23)
Calcium, Ion: 1.16 mmol/L (ref 1.15–1.40)
Chloride: 105 mmol/L (ref 98–111)
Creatinine, Ser: 1.4 mg/dL — ABNORMAL HIGH (ref 0.61–1.24)
Glucose, Bld: 78 mg/dL (ref 70–99)
HCT: 34 % — ABNORMAL LOW (ref 39.0–52.0)
Hemoglobin: 11.6 g/dL — ABNORMAL LOW (ref 13.0–17.0)
Potassium: 4.8 mmol/L (ref 3.5–5.1)
Sodium: 139 mmol/L (ref 135–145)
TCO2: 24 mmol/L (ref 22–32)

## 2020-05-28 LAB — PROTIME-INR
INR: 1 (ref 0.8–1.2)
Prothrombin Time: 13 seconds (ref 11.4–15.2)

## 2020-05-28 LAB — DIFFERENTIAL
Abs Immature Granulocytes: 0.11 10*3/uL — ABNORMAL HIGH (ref 0.00–0.07)
Basophils Absolute: 0.1 10*3/uL (ref 0.0–0.1)
Basophils Relative: 1 %
Eosinophils Absolute: 0.3 10*3/uL (ref 0.0–0.5)
Eosinophils Relative: 3 %
Immature Granulocytes: 1 %
Lymphocytes Relative: 23 %
Lymphs Abs: 1.8 10*3/uL (ref 0.7–4.0)
Monocytes Absolute: 0.7 10*3/uL (ref 0.1–1.0)
Monocytes Relative: 9 %
Neutro Abs: 5 10*3/uL (ref 1.7–7.7)
Neutrophils Relative %: 63 %

## 2020-05-28 LAB — CBC
HCT: 37.2 % — ABNORMAL LOW (ref 39.0–52.0)
Hemoglobin: 12.1 g/dL — ABNORMAL LOW (ref 13.0–17.0)
MCH: 30.9 pg (ref 26.0–34.0)
MCHC: 32.5 g/dL (ref 30.0–36.0)
MCV: 94.9 fL (ref 80.0–100.0)
Platelets: 237 10*3/uL (ref 150–400)
RBC: 3.92 MIL/uL — ABNORMAL LOW (ref 4.22–5.81)
RDW: 13.3 % (ref 11.5–15.5)
WBC: 7.9 10*3/uL (ref 4.0–10.5)
nRBC: 0 % (ref 0.0–0.2)

## 2020-05-28 LAB — URINALYSIS, ROUTINE W REFLEX MICROSCOPIC
Bilirubin Urine: NEGATIVE
Glucose, UA: NEGATIVE mg/dL
Hgb urine dipstick: NEGATIVE
Ketones, ur: NEGATIVE mg/dL
Leukocytes,Ua: NEGATIVE
Nitrite: NEGATIVE
Protein, ur: NEGATIVE mg/dL
Specific Gravity, Urine: 1.012 (ref 1.005–1.030)
pH: 5 (ref 5.0–8.0)

## 2020-05-28 LAB — COMPREHENSIVE METABOLIC PANEL
ALT: 25 U/L (ref 0–44)
AST: 22 U/L (ref 15–41)
Albumin: 3.9 g/dL (ref 3.5–5.0)
Alkaline Phosphatase: 48 U/L (ref 38–126)
Anion gap: 10 (ref 5–15)
BUN: 21 mg/dL (ref 8–23)
CO2: 23 mmol/L (ref 22–32)
Calcium: 8.7 mg/dL — ABNORMAL LOW (ref 8.9–10.3)
Chloride: 104 mmol/L (ref 98–111)
Creatinine, Ser: 1.35 mg/dL — ABNORMAL HIGH (ref 0.61–1.24)
GFR, Estimated: 59 mL/min — ABNORMAL LOW (ref >60)
Glucose, Bld: 77 mg/dL (ref 70–99)
Potassium: 4.8 mmol/L (ref 3.5–5.1)
Sodium: 137 mmol/L (ref 135–145)
Total Bilirubin: 0.5 mg/dL (ref 0.3–1.2)
Total Protein: 7.5 g/dL (ref 6.5–8.1)

## 2020-05-28 LAB — APTT: aPTT: 30 seconds (ref 24–36)

## 2020-05-28 LAB — ETHANOL: Alcohol, Ethyl (B): <10 mg/dL (ref <10)

## 2020-05-28 LAB — RAPID URINE DRUG SCREEN, HOSP PERFORMED
Amphetamines: NOT DETECTED
Barbiturates: NOT DETECTED
Benzodiazepines: NOT DETECTED
Cocaine: NOT DETECTED
Opiates: NOT DETECTED
Tetrahydrocannabinol: NOT DETECTED

## 2020-05-28 LAB — CBG MONITORING, ED: Glucose-Capillary: 152 mg/dL — ABNORMAL HIGH (ref 70–99)

## 2020-05-28 MED ORDER — ASPIRIN 325 MG PO TABS: 325.0000 mg | ORAL_TABLET | Freq: Every day | ORAL | Status: DC

## 2020-05-28 MED ORDER — PROCHLORPERAZINE EDISYLATE 10 MG/2ML IJ SOLN
5.0000 mg | Freq: Once | INTRAMUSCULAR | Status: AC
  Administered 2020-05-28: 5 mg via INTRAVENOUS
  Filled 2020-05-28: qty 2

## 2020-05-28 MED ORDER — ACETAMINOPHEN 325 MG PO TABS
650.0000 mg | ORAL_TABLET | ORAL | Status: DC | PRN
  Administered 2020-05-29 – 2020-05-30 (×3): 650 mg via ORAL
  Filled 2020-05-28 (×3): qty 2

## 2020-05-28 MED ORDER — ASPIRIN 325 MG PO TABS
325.0000 mg | ORAL_TABLET | Freq: Every day | ORAL | Status: DC
  Administered 2020-05-29 – 2020-05-31 (×3): 325 mg via ORAL
  Filled 2020-05-28 (×3): qty 1

## 2020-05-28 MED ORDER — SACUBITRIL-VALSARTAN 24-26 MG PO TABS
1.0000 | ORAL_TABLET | Freq: Two times a day (BID) | ORAL | Status: DC
  Administered 2020-05-29 – 2020-05-30 (×3): 1 via ORAL
  Filled 2020-05-28 (×8): qty 1

## 2020-05-28 MED ORDER — ATORVASTATIN CALCIUM 10 MG PO TABS
20.0000 mg | ORAL_TABLET | Freq: Every day | ORAL | Status: DC
  Administered 2020-05-29 – 2020-05-31 (×3): 20 mg via ORAL
  Filled 2020-05-28 (×3): qty 2

## 2020-05-28 MED ORDER — ASPIRIN 325 MG PO TABS: 325.0000 mg | ORAL_TABLET | Freq: Once | ORAL | Status: DC

## 2020-05-28 MED ORDER — GLIPIZIDE 5 MG PO TABS
5.0000 mg | ORAL_TABLET | Freq: Two times a day (BID) | ORAL | Status: DC
  Administered 2020-05-29: 5 mg via ORAL
  Filled 2020-05-28 (×3): qty 1

## 2020-05-28 MED ORDER — GABAPENTIN 300 MG PO CAPS
300.0000 mg | ORAL_CAPSULE | Freq: Three times a day (TID) | ORAL | Status: DC
  Administered 2020-05-29 – 2020-05-31 (×7): 300 mg via ORAL
  Filled 2020-05-28 (×7): qty 1

## 2020-05-28 MED ORDER — TRAZODONE HCL 100 MG PO TABS
100.0000 mg | ORAL_TABLET | Freq: Every day | ORAL | Status: DC
  Administered 2020-05-29 – 2020-05-30 (×2): 100 mg via ORAL
  Filled 2020-05-28 (×2): qty 1

## 2020-05-28 MED ORDER — ENOXAPARIN SODIUM 40 MG/0.4ML ~~LOC~~ SOLN
40.0000 mg | SUBCUTANEOUS | Status: DC
  Administered 2020-05-29 – 2020-05-31 (×2): 40 mg via SUBCUTANEOUS
  Filled 2020-05-28 (×3): qty 0.4

## 2020-05-28 MED ORDER — ACETAMINOPHEN 650 MG RE SUPP
650.0000 mg | RECTAL | Status: DC | PRN
  Filled 2020-05-28: qty 1

## 2020-05-28 MED ORDER — FUROSEMIDE 40 MG PO TABS
40.0000 mg | ORAL_TABLET | Freq: Every day | ORAL | Status: DC
  Administered 2020-05-29: 40 mg via ORAL
  Filled 2020-05-28: qty 1

## 2020-05-28 MED ORDER — IOHEXOL 350 MG/ML SOLN
100.0000 mL | Freq: Once | INTRAVENOUS | Status: AC | PRN
  Administered 2020-05-28: 100 mL via INTRAVENOUS

## 2020-05-28 MED ORDER — INSULIN ASPART 100 UNIT/ML ~~LOC~~ SOLN: 0.0000 [IU] | Freq: Every day | SUBCUTANEOUS | Status: DC

## 2020-05-28 MED ORDER — STROKE: EARLY STAGES OF RECOVERY BOOK
Freq: Once | Status: DC
  Filled 2020-05-28 (×2): qty 1

## 2020-05-28 MED ORDER — ACETAMINOPHEN 160 MG/5ML PO SOLN: 650.0000 mg | ORAL | Status: DC | PRN

## 2020-05-28 MED ORDER — INSULIN ASPART 100 UNIT/ML ~~LOC~~ SOLN
0.0000 [IU] | Freq: Three times a day (TID) | SUBCUTANEOUS | Status: DC
  Administered 2020-05-29: 2 [IU] via SUBCUTANEOUS
  Administered 2020-05-29: 1 [IU] via SUBCUTANEOUS
  Administered 2020-05-29 – 2020-05-30 (×2): 5 [IU] via SUBCUTANEOUS
  Administered 2020-05-31: 2 [IU] via SUBCUTANEOUS
  Administered 2020-05-31: 3 [IU] via SUBCUTANEOUS
  Filled 2020-05-28 (×2): qty 1

## 2020-05-28 MED ORDER — METOPROLOL SUCCINATE ER 25 MG PO TB24
25.0000 mg | ORAL_TABLET | Freq: Two times a day (BID) | ORAL | Status: DC
  Administered 2020-05-29 – 2020-05-31 (×5): 25 mg via ORAL
  Filled 2020-05-28 (×5): qty 1

## 2020-05-28 MED ORDER — SENNOSIDES-DOCUSATE SODIUM 8.6-50 MG PO TABS
1.0000 | ORAL_TABLET | Freq: Every evening | ORAL | Status: DC | PRN
  Filled 2020-05-28: qty 1

## 2020-05-28 MED ORDER — DIPHENHYDRAMINE HCL 50 MG/ML IJ SOLN
12.5000 mg | Freq: Once | INTRAMUSCULAR | Status: AC
  Administered 2020-05-28: 12.5 mg via INTRAVENOUS
  Filled 2020-05-28: qty 1

## 2020-05-28 MED ORDER — SPIRONOLACTONE 25 MG PO TABS
25.0000 mg | ORAL_TABLET | Freq: Every day | ORAL | Status: DC
  Administered 2020-05-29 – 2020-05-31 (×3): 25 mg via ORAL
  Filled 2020-05-28 (×4): qty 1

## 2020-05-28 NOTE — ED Triage Notes (Signed)
Pt to er, pt states that he has had a headache for the past 6 weeks, states that in the past 4-5 days he has been very unsteady on his feet and can't walk, states that he has been falling a lot.  Pt reports that he has some weakness on his L side.  States that his confusion and decreased sensation have all been the last 4-5 days.

## 2020-05-28 NOTE — H&P (Signed)
History and Physical    Dalton Porter UXN:[id number] DOB: 1955/08/31 DOA: 05/28/2020  [facility]   Patient coming from: Home  I have personally briefly reviewed patient's old medical records in [facility]  Chief Complaint: Left-sided weakness.  HPI: Dalton Porter is a 64 y.o. male with medical history significant for stroke, diabetes mellitus, cardiomyopathy. Patient presented to the ED with complaints of intermittent headaches more on the right side of his head, ongoing for about 6 weeks.  Reports some improvement in intensity of headaches with Tylenol and Excedrin.  Reports he used to have headaches about 20 to 30 years ago. Reports over the past week he started having left-sided -upper and lower extremity weakness, and difficulty finding his words.  Patient reports about a year ago he had a stroke, weakness involve mostly his lower extremity as he was unable to walk.  Reports symptoms were not as severe as this. With this new weakness, he is able to ambulate only with assistance of his wife otherwise he cannot ambulate. Reports compliance with his medications including aspirin 325 mg daily and statins.  ED Course: Temperature 98.3.  Blood pressure soft systolic 96 240, temperature 98.3.  Heart rate 50s to 70s.  Glucose 77 otherwise unremarkable CMP, CBC.  EKG shows sinus rhythm, borderline PR prolongation.  CTA head and neck-without acute abnormality. EDP talked to neurology on-call recommended admission to [facility], patient has an AICD which can be switched to for MRI.  Review of Systems: As per HPI all other systems reviewed and negative.  Past Medical History:  Diagnosis Date  . Back pain, chronic   . CHF (congestive heart failure) (HCC)   . Diabetes mellitus   . Nonischemic cardiomyopathy (HCC)    a. EF 40% by echo in 2003 b. 03/2016: cath showing normal cors and EF of 25-35%, EF 40-45% by echo with diffuse hypokinesis.  . Stroke [facility])     Past  Surgical History:  Procedure Laterality Date  . ANKLE SURGERY    . APPENDECTOMY    . BACK SURGERY     x3  . CARDIAC CATHETERIZATION  2002  . CARDIAC CATHETERIZATION N/A 04/08/2016   Procedure: Left Heart Cath and Coronary Angiography;  Surgeon: Peter M Swaziland, MD;  Location: [facility] INVASIVE CV LAB;  Service: Cardiovascular;  Laterality: N/A;  . CHOLECYSTECTOMY    . ELBOW SURGERY    . ICD IMPLANT  01/26/2018   SJM Fortify Assura VR ICD implanted by Dr Marily Memos for primary prevention of sudden death  . THUMB FUSION       reports that he has never smoked. He has never used smokeless tobacco. He reports that he does not drink alcohol and does not use drugs.  Allergies  Allergen Reactions  . 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 Codeine Rash    headache    Family History  Problem Relation Age of Onset  . Stroke Other   . Pancreatic cancer Father   . Healthy Sister   . Other Brother        died in a fire  . Leukemia Maternal Grandmother   . Healthy Sister     Prior to Admission medications   Medication Sig Start Date End Date Taking? Authorizing Provider  aspirin 325 MG tablet Take 325 mg by mouth daily.   Yes [provider]  atorvastatin (LIPITOR) 20 MG tablet Take 1 tablet (20 mg total) by mouth daily. 04/13/19 05/28/20 Yes Strader, Lennart Pall, PA-C  Cholecalciferol (VITAMIN D3) 5000 units CAPS Take 1 capsule by mouth daily.   Yes [provider]  furosemide (LASIX) 20 MG tablet Take 2 tablets (40 mg total) by mouth daily. 06/21/19  Yes Strader, [place] M, PA-C  gabapentin (NEURONTIN) 300 MG capsule Take 300 mg by mouth 3 (three) times daily.   Yes [provider]  glipiZIDE (GLUCOTROL) 5 MG tablet Take 5 mg by mouth 2 (two) times daily before a meal.     Yes [provider]  metFORMIN (GLUCOPHAGE) 500 MG tablet Take 500 mg by mouth 2 (two) times  daily with a meal.     Yes [provider]  metoprolol succinate (TOPROL XL) 25 MG 24 hr tablet Take 1 tablet (25 mg total) by mouth 2 (two) times daily. 04/13/19  Yes Strader, [place] M, PA-C  sacubitril-valsartan (ENTRESTO) 24-26 MG Take 1 tablet by mouth 2 (two) times daily. 05/18/19  Yes BranchDorothe Pea, MD  spironolactone (ALDACTONE) 25 MG tablet Take 25 mg by mouth daily. [facility] and pt reports on 11/30/2018 that he takes daily.   Yes [provider]  TRAZODONE HCL PO Take 100 mg by mouth at bedtime.    Yes [provider]  nitroGLYCERIN (NITROSTAT) 0.4 MG SL tablet Place 1 tablet (0.4 mg total) under the tongue every 5 (five) minutes as needed for chest pain. 04/13/19   Ellsworth Lennox, PA-C    Physical Exam: Vitals:   05/28/20 2000 05/28/20 2100 05/28/20 2130 05/28/20 2200  BP: 140/70 (!) 109/58 (!) 126/58 (!) 109/51  Pulse: 79 69 71 72  Resp: 17 20 17 15   Temp:      TempSrc:      SpO2: 100% 99% 99% 96%  Weight:      Height:        Constitutional: NAD, calm, comfortable Vitals:   05/28/20 2000 05/28/20 2100 05/28/20 2130 05/28/20 2200  BP: 140/70 (!) 109/58 (!) 126/58 (!) 109/51  Pulse: 79 69 71 72  Resp: 17 20 17 15   Temp:      TempSrc:      SpO2: 100% 99% 99% 96%  Weight:      Height:       Eyes: PERRL, lids and conjunctivae normal ENMT: Mucous membranes are moist  Neck: normal, supple, no masses, no thyromegaly Respiratory: clear to auscultation bilaterally, no wheezing, no crackles. Normal respiratory effort. No accessory muscle use.  Cardiovascular: Regular rate and rhythm, no murmurs / rubs / gallops. No extremity edema. 2+ pedal pulses.  AICD pocket left upper chest. Abdomen: no tenderness, no masses palpated. No hepatosplenomegaly. Bowel sounds positive.  Musculoskeletal: no clubbing / cyanosis. No joint deformity upper and lower extremities. Good ROM, no contractures. Normal muscle tone.  Skin: no rashes, lesions,  ulcers. No induration Neurologic: Neurological:     Mental Status: he is alert.     GCS: GCS eye subscore is 4. GCS verbal subscore is 5. GCS motor subscore is 6.     Comments: Mental Status:  Alert, oriented, thought content appropriate, able to give a coherent history. Speech fluent without evidence of aphasia. Able to follow 2 step commands without difficulty.  Cranial Nerves:  II:  Peripheral visual fields grossly normal, pupils equal, round, reactive to light III,IV, VI: ptosis not present, extra-ocular motions intact bilaterally  V,VII: smile symmetric,  eyebrows raise symmetric, facial light touch sensation equal VIII: hearing grossly normal to voice  X: Not tested XI: bilateral shoulder shrug symmetric and strong XII: midline tongue extension without fassiculations Motor:  Normal tone.    Strength right upper and lower extremity 5/5.  Strength left upper extremity 5/5, strength left lower extremity 4+/5. Sensory: Sensation intact to light touch in all extremities.  Cerebellar: Not tested.  CV: distal pulses palpable throughout    Psychiatric: Normal judgment and insight. Alert and oriented x 3. Normal mood.   Labs on Admission: I have personally reviewed following labs and imaging studies  CBC: Recent Labs  Lab 05/28/20 1420 05/28/20 1423  WBC 7.9  --   NEUTROABS 5.0  --   HGB 12.1* 11.6*  HCT 37.2* 34.0*  MCV 94.9  --   PLT 237  --    Basic Metabolic Panel: Recent Labs  Lab 05/28/20 1420 05/28/20 1423  NA 137 139  K 4.8 4.8  CL 104 105  CO2 23  --   GLUCOSE 77 78  BUN 21 21  CREATININE 1.35* 1.40*  CALCIUM 8.7*  --    Liver Function Tests: Recent Labs  Lab 05/28/20 1420  AST 22  ALT 25  ALKPHOS 48  BILITOT 0.5  PROT 7.5  ALBUMIN 3.9   Coagulation Profile: Recent Labs  Lab 05/28/20 1420  INR 1.0   Urine analysis:    Component Value Date/Time   COLORURINE YELLOW 05/28/2020 1310   APPEARANCEUR CLEAR 05/28/2020 1310   LABSPEC 1.012  05/28/2020 1310   PHURINE 5.0 05/28/2020 1310   GLUCOSEU NEGATIVE 05/28/2020 1310   HGBUR NEGATIVE 05/28/2020 1310   BILIRUBINUR NEGATIVE 05/28/2020 1310   KETONESUR NEGATIVE 05/28/2020 1310   PROTEINUR NEGATIVE 05/28/2020 1310   NITRITE NEGATIVE 05/28/2020 1310   LEUKOCYTESUR NEGATIVE 05/28/2020 1310    Radiological Exams on Admission: CT Angio Head W or Wo Contrast  Result Date: 05/28/2020 CLINICAL DATA:  Dizziness.  Headache. EXAM: CT ANGIOGRAPHY HEAD AND NECK TECHNIQUE: Multidetector CT imaging of the head and neck was performed using the standard protocol during bolus administration of intravenous contrast. Multiplanar CT image reconstructions and MIPs were obtained to evaluate the vascular anatomy. Carotid stenosis measurements (when applicable) are obtained utilizing NASCET criteria, using the distal internal carotid diameter as the denominator. CONTRAST:  OMNIPAQUE IOHEXOL 350 MG/ML SOLN COMPARISON:  CTA Aug 27, 2018 FINDINGS: CT HEAD FINDINGS Brain: No evidence of acute large vascular territory infarction, hemorrhage, hydrocephalus, extra-axial collection or mass lesion/mass effect. Skull: No acute fracture. Sinuses: No acute finding. Orbits: No acute finding. Review of the MIP images confirms the above findings CTA NECK FINDINGS Aortic arch: Imaged portion shows no evidence of aneurysm or dissection. No significant stenosis of the major arch vessel origins. Right carotid system: No evidence of dissection, stenosis (50% or greater) or occlusion. Left carotid system: No evidence of dissection, stenosis (50% or greater) or occlusion. Mixed atherosclerosis at the bifurcation. Vertebral arteries: Codominant. No evidence of dissection, stenosis (50% or greater) or occlusion. Calcific atherosclerosis of the intradural left vertebral artery with mild associated narrowing. Skeleton: No evidence of acute fracture. Other neck: No mass or suspicious adenopathy. Upper chest: Negative. Review of the  MIP images confirms the above findings CTA HEAD FINDINGS Anterior circulation: No significant stenosis, proximal occlusion, aneurysm, or vascular malformation. Calcific atherosclerosis of bilateral cavernous carotid arteries. Posterior circulation: No significant stenosis, proximal occlusion, aneurysm, or vascular malformation. Venous sinuses: As permitted by contrast timing, patent. Review of  the MIP images confirms the above findings IMPRESSION: 1. No evidence of acute intracranial abnormality. 2. No evidence of acute dissection, hemodynamically significant proximal stenosis, or large vessel occlusion in the head or neck. 3. Mild atherosclerotic disease involving the left carotid bifurcation, left V4 vertebral artery, and bilateral cavernous carotid arteries. Electronically Signed   By: Feliberto Harts MD   On: 05/28/2020 16:16   CT Angio Neck W and/or Wo Contrast  Result Date: 05/28/2020 CLINICAL DATA:  Dizziness.  Headache. EXAM: CT ANGIOGRAPHY HEAD AND NECK TECHNIQUE: Multidetector CT imaging of the head and neck was performed using the standard protocol during bolus administration of intravenous contrast. Multiplanar CT image reconstructions and MIPs were obtained to evaluate the vascular anatomy. Carotid stenosis measurements (when applicable) are obtained utilizing NASCET criteria, using the distal internal carotid diameter as the denominator. CONTRAST:  OMNIPAQUE IOHEXOL 350 MG/ML SOLN COMPARISON:  CTA Aug 27, 2018 FINDINGS: CT HEAD FINDINGS Brain: No evidence of acute large vascular territory infarction, hemorrhage, hydrocephalus, extra-axial collection or mass lesion/mass effect. Skull: No acute fracture. Sinuses: No acute finding. Orbits: No acute finding. Review of the MIP images confirms the above findings CTA NECK FINDINGS Aortic arch: Imaged portion shows no evidence of aneurysm or dissection. No significant stenosis of the major arch vessel origins. Right carotid system: No evidence of  dissection, stenosis (50% or greater) or occlusion. Left carotid system: No evidence of dissection, stenosis (50% or greater) or occlusion. Mixed atherosclerosis at the bifurcation. Vertebral arteries: Codominant. No evidence of dissection, stenosis (50% or greater) or occlusion. Calcific atherosclerosis of the intradural left vertebral artery with mild associated narrowing. Skeleton: No evidence of acute fracture. Other neck: No mass or suspicious adenopathy. Upper chest: Negative. Review of the MIP images confirms the above findings CTA HEAD FINDINGS Anterior circulation: No significant stenosis, proximal occlusion, aneurysm, or vascular malformation. Calcific atherosclerosis of bilateral cavernous carotid arteries. Posterior circulation: No significant stenosis, proximal occlusion, aneurysm, or vascular malformation. Venous sinuses: As permitted by contrast timing, patent. Review of the MIP images confirms the above findings IMPRESSION: 1. No evidence of acute intracranial abnormality. 2. No evidence of acute dissection, hemodynamically significant proximal stenosis, or large vessel occlusion in the head or neck. 3. Mild atherosclerotic disease involving the left carotid bifurcation, left V4 vertebral artery, and bilateral cavernous carotid arteries. Electronically Signed   By: Feliberto Harts MD   On: 05/28/2020 16:16    EKG: Independently reviewed.  Rhythm, with borderline prolonged PR interval.  QTc 454.  Assessment/Plan Principal Problem:   Left-sided weakness Active Problems:   Diabetes mellitus type 2 with neurological manifestations (HCC)   HTN (hypertension)   Nonischemic cardiomyopathy (HCC)   Left-sided weakness-and word finding difficulty of 1 week, headache of 6 weeks.  Objectively 4+5 strength in left lower, otherwise unremarkable.  Reports stroke  ~year ago, I do not see this in epic or Care Everywhere.  CTA head and neck without acute abnormalities.  Reports compliance with aspirin,  statins. - EDP talked to neurology agreed with admission to [facility] for [facility] -Passed bedside swallow evaluation -MRI brain without contrast to be done at [facility] (AICD to be switched off) -PT/OT/ST evaluation -Lipid panel, hemoglobin A1c in a.m. -Carotid Dopplers deferred CTA neck done -Echocardiogram -Aspirin 325 mg daily -Continue statin at increased dose 40 mg daily  Diabetes mellitus with neuropathy-random glucose 77 -Resume gabapentin -Resume glipizide -Hold Metformin - SSI- S - HgbA1c  Hypertension-blood pressure soft currently -Resume Entresto -Resume spironolactone, Lasix, metoprolol  in a.m.  Ischemic cardiomyopathy, AICD status - stable.  Follows with Dr. Johney Frame.  Last echo 2020-EF 50 to 55%, with impaired LV relaxation. -Resume aspirin, statins -Resume Lasix, Entresto, spironolactone, metoprolol  DVT prophylaxis: Lovenox Code Status: Full code Family Communication: None at bedside Disposition Plan:  ~ 1 - 2 days Consults called: None.  Pending MRI findings consider reconsulting neurology Admission status: Observation, telemetry   Dalton Stuber Wendall Stade MD [facility]  05/28/2020, 10:27 PM

## 2020-05-28 NOTE — ED Notes (Signed)
Pt passed stroke swallow screen  

## 2020-05-28 NOTE — ED Notes (Signed)
Pt to CT at this time.

## 2020-05-28 NOTE — ED Provider Notes (Signed)
[facility] Provider Note   CSN: [id number] Arrival date & time: 05/28/20  1026     History Chief Complaint  Patient presents with  . Cerebrovascular Accident    [facility] is a 64 y.o. male who presents emergency department chief complaint of headache.  Patient states that he has had a constant headache on the left side which is waxing and waning, improved with Excedrin Migraine.  He states that at times it is sharp and then aching and constant.  He has had associated progressively worsening disequilibrium, weakness on the left side of his body, paresthesia of the left face and left upper extremity and difficulty with word finding.  Patient states that his balance has been extremely poor this past week and given his bad headache and concern for fall he decided to come in to be evaluated.  The patient states that he had similar symptoms 1 year ago and was seen at [facility] where he had a CT scan and was told that he had an ischemic stroke.  He was unable to obtain MRI secondary to his AICD which is in place. He denies any changes in vision, tinnitus, vomiting, fever, cough, difficulty swallowing. Patient is on daily asa    HPI     Past Medical History:  Diagnosis Date  . Back pain, chronic   . CHF (congestive heart failure) (HCC)   . Diabetes mellitus   . Nonischemic cardiomyopathy (HCC)    a. EF 40% by echo in 2003 b. 03/2016: cath showing normal cors and EF of 25-35%, EF 40-45% by echo with diffuse hypokinesis.  . Stroke [facility])     Patient Active Problem List   Diagnosis Date Noted  . Left-sided weakness 05/28/2020  . History of stroke 01/19/2019  . Nonischemic cardiomyopathy (HCC) 04/09/2016  . 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 Chest pain on exertion 01/15/2011  . HTN (hypertension) 01/15/2011    Past Surgical History:  Procedure Laterality Date  . ANKLE SURGERY    . APPENDECTOMY     . BACK SURGERY     x3  . CARDIAC CATHETERIZATION  2002  . CARDIAC CATHETERIZATION N/A 04/08/2016   Procedure: Left Heart Cath and Coronary Angiography;  Surgeon: Peter M Swaziland, MD;  Location: [facility] INVASIVE CV LAB;  Service: Cardiovascular;  Laterality: N/A;  . CHOLECYSTECTOMY    . ELBOW SURGERY    . ICD IMPLANT  01/26/2018   SJM Fortify Assura VR ICD implanted by Dr Marily Memos for primary prevention of sudden death  . THUMB FUSION         Family History  Problem Relation Age of Onset  . Stroke Other   . Pancreatic cancer Father   . Healthy Sister   . Other Brother        died in a fire  . Leukemia Maternal Grandmother   . Healthy Sister     Social History   Tobacco Use  . Smoking status: Never Smoker  . Smokeless tobacco: Never Used  Vaping Use  . Vaping Use: Never used  Substance Use Topics  . Alcohol use: No  . Drug use: No    Home Medications Prior to Admission medications   Medication Sig Start Date End Date Taking? Authorizing Provider  aspirin 325 MG tablet Take 325 mg by mouth daily.   Yes [provider]  atorvastatin (LIPITOR) 20 MG tablet Take 1 tablet (20 mg total) by mouth daily. 04/13/19 05/28/20  Yes [place] Ouch, [place] M, PA-C  Cholecalciferol (VITAMIN D3) 5000 units CAPS Take 1 capsule by mouth daily.   Yes [provider]  furosemide (LASIX) 20 MG tablet Take 2 tablets (40 mg total) by mouth daily. 06/21/19  Yes Strader, [place] M, PA-C  gabapentin (NEURONTIN) 300 MG capsule Take 300 mg by mouth 3 (three) times daily.   Yes [provider]  glipiZIDE (GLUCOTROL) 5 MG tablet Take 5 mg by mouth 2 (two) times daily before a meal.     Yes [provider]  metFORMIN (GLUCOPHAGE) 500 MG tablet Take 500 mg by mouth 2 (two) times daily with a meal.     Yes [provider]  metoprolol succinate (TOPROL XL) 25 MG 24 hr tablet Take 1 tablet (25 mg total) by mouth 2 (two) times daily. 04/13/19  Yes Strader, [place] M, PA-C   sacubitril-valsartan (ENTRESTO) 24-26 MG Take 1 tablet by mouth 2 (two) times daily. 05/18/19  Yes BranchDorothe Pea, MD  spironolactone (ALDACTONE) 25 MG tablet Take 25 mg by mouth daily. [facility] and [facility] reports on 11/30/2018 that he takes daily.   Yes [provider]  TRAZODONE HCL PO Take 100 mg by mouth at bedtime.    Yes [provider]  nitroGLYCERIN (NITROSTAT) 0.4 MG SL tablet Place 1 tablet (0.4 mg total) under the tongue every 5 (five) minutes as needed for chest pain. 04/13/19   Strader, Lennart Pall, PA-C    Allergies    Peanuts [nuts], Meat extract, and Codeine  Review of Systems   Review of Systems Ten systems reviewed and are negative for acute change, except as noted in the HPI.   Physical Exam Updated Vital Signs BP 123/63   Pulse 81   Temp 98.3 F (36.8 C) (Oral)   Resp 20   Ht 5\' 11"  (1.803 m)   Wt 108.9 kg   SpO2 96%   BMI 33.47 kg/m   Physical Exam Vitals and nursing note reviewed.  Constitutional:      General: He is not in acute distress.    Appearance: He is well-developed. He is not diaphoretic.  HENT:     Head: Normocephalic and atraumatic.  Eyes:     General: No scleral icterus.    Conjunctiva/sclera: Conjunctivae normal.  Cardiovascular:     Rate and Rhythm: Normal rate and regular rhythm.     Heart sounds: Normal heart sounds.  Pulmonary:     Effort: Pulmonary effort is normal. No respiratory distress.     Breath sounds: Normal breath sounds.  Abdominal:     Palpations: Abdomen is soft.     Tenderness: There is no abdominal tenderness.  Musculoskeletal:     Cervical back: Normal range of motion and neck supple.  Skin:    General: Skin is warm and dry.  Neurological:     Mental Status: He is alert and oriented to person, place, and time.     GCS: GCS eye subscore is 4. GCS verbal subscore is 5. GCS motor subscore is 6.  Psychiatric:        Behavior: Behavior normal.     ED Results / Procedures / Treatments    Labs (all labs ordered are listed, but only abnormal results are displayed) Labs Reviewed  CBC - Abnormal; Notable for the following components:      Result Value   RBC 3.92 (*)    Hemoglobin 12.1 (*)    HCT 37.2 (*)    All other  components within normal limits  DIFFERENTIAL - Abnormal; Notable for the following components:   Abs Immature Granulocytes 0.11 (*)    All other components within normal limits  COMPREHENSIVE METABOLIC PANEL - Abnormal; Notable for the following components:   Creatinine, Ser 1.35 (*)    Calcium 8.7 (*)    GFR, Estimated 59 (*)    All other components within normal limits  I-STAT CHEM 8, ED - Abnormal; Notable for the following components:   Creatinine, Ser 1.40 (*)    Hemoglobin 11.6 (*)    HCT 34.0 (*)    All other components within normal limits  RESP PANEL BY RT-PCR (FLU A&B, COVID) ARPGX2  ETHANOL  PROTIME-INR  APTT  RAPID URINE DRUG SCREEN, HOSP PERFORMED  URINALYSIS, ROUTINE W REFLEX MICROSCOPIC    EKG EKG Interpretation  Date/Time:  Monday May 28 2020 13:51:56 EST Ventricular Rate:  76 PR Interval:    QRS Duration: 103 QT Interval:  403 QTC Calculation: 454 R Axis:   5 Text Interpretation: Sinus arrhythmia Sinus pause with ventricular escape Low voltage, precordial leads Confirmed by Meridee Score [phone]) on 05/28/2020 1:57:25 PM   Radiology CT Angio Head W or Wo Contrast  Result Date: 05/28/2020 CLINICAL DATA:  Dizziness.  Headache. EXAM: CT ANGIOGRAPHY HEAD AND NECK TECHNIQUE: Multidetector CT imaging of the head and neck was performed using the standard protocol during bolus administration of intravenous contrast. Multiplanar CT image reconstructions and MIPs were obtained to evaluate the vascular anatomy. Carotid stenosis measurements (when applicable) are obtained utilizing NASCET criteria, using the distal internal carotid diameter as the denominator. CONTRAST:  OMNIPAQUE IOHEXOL 350 MG/ML SOLN COMPARISON:  CTA Aug 27, 2018 FINDINGS: CT HEAD FINDINGS Brain: No evidence of acute large vascular territory infarction, hemorrhage, hydrocephalus, extra-axial collection or mass lesion/mass effect. Skull: No acute fracture. Sinuses: No acute finding. Orbits: No acute finding. Review of the MIP images confirms the above findings CTA NECK FINDINGS Aortic arch: Imaged portion shows no evidence of aneurysm or dissection. No significant stenosis of the major arch vessel origins. Right carotid system: No evidence of dissection, stenosis (50% or greater) or occlusion. Left carotid system: No evidence of dissection, stenosis (50% or greater) or occlusion. Mixed atherosclerosis at the bifurcation. Vertebral arteries: Codominant. No evidence of dissection, stenosis (50% or greater) or occlusion. Calcific atherosclerosis of the intradural left vertebral artery with mild associated narrowing. Skeleton: No evidence of acute fracture. Other neck: No mass or suspicious adenopathy. Upper chest: Negative. Review of the MIP images confirms the above findings CTA HEAD FINDINGS Anterior circulation: No significant stenosis, proximal occlusion, aneurysm, or vascular malformation. Calcific atherosclerosis of bilateral cavernous carotid arteries. Posterior circulation: No significant stenosis, proximal occlusion, aneurysm, or vascular malformation. Venous sinuses: As permitted by contrast timing, patent. Review of the MIP images confirms the above findings IMPRESSION: 1. No evidence of acute intracranial abnormality. 2. No evidence of acute dissection, hemodynamically significant proximal stenosis, or large vessel occlusion in the head or neck. 3. Mild atherosclerotic disease involving the left carotid bifurcation, left V4 vertebral artery, and bilateral cavernous carotid arteries. Electronically Signed   By: Feliberto Harts MD   On: 05/28/2020 16:16   CT Angio Neck W and/or Wo Contrast  Result Date: 05/28/2020 CLINICAL DATA:  Dizziness.  Headache.  EXAM: CT ANGIOGRAPHY HEAD AND NECK TECHNIQUE: Multidetector CT imaging of the head and neck was performed using the standard protocol during bolus administration of intravenous contrast. Multiplanar CT image reconstructions and MIPs were obtained to  evaluate the vascular anatomy. Carotid stenosis measurements (when applicable) are obtained utilizing NASCET criteria, using the distal internal carotid diameter as the denominator. CONTRAST:  OMNIPAQUE IOHEXOL 350 MG/ML SOLN COMPARISON:  CTA Aug 27, 2018 FINDINGS: CT HEAD FINDINGS Brain: No evidence of acute large vascular territory infarction, hemorrhage, hydrocephalus, extra-axial collection or mass lesion/mass effect. Skull: No acute fracture. Sinuses: No acute finding. Orbits: No acute finding. Review of the MIP images confirms the above findings CTA NECK FINDINGS Aortic arch: Imaged portion shows no evidence of aneurysm or dissection. No significant stenosis of the major arch vessel origins. Right carotid system: No evidence of dissection, stenosis (50% or greater) or occlusion. Left carotid system: No evidence of dissection, stenosis (50% or greater) or occlusion. Mixed atherosclerosis at the bifurcation. Vertebral arteries: Codominant. No evidence of dissection, stenosis (50% or greater) or occlusion. Calcific atherosclerosis of the intradural left vertebral artery with mild associated narrowing. Skeleton: No evidence of acute fracture. Other neck: No mass or suspicious adenopathy. Upper chest: Negative. Review of the MIP images confirms the above findings CTA HEAD FINDINGS Anterior circulation: No significant stenosis, proximal occlusion, aneurysm, or vascular malformation. Calcific atherosclerosis of bilateral cavernous carotid arteries. Posterior circulation: No significant stenosis, proximal occlusion, aneurysm, or vascular malformation. Venous sinuses: As permitted by contrast timing, patent. Review of the MIP images confirms the above findings  IMPRESSION: 1. No evidence of acute intracranial abnormality. 2. No evidence of acute dissection, hemodynamically significant proximal stenosis, or large vessel occlusion in the head or neck. 3. Mild atherosclerotic disease involving the left carotid bifurcation, left V4 vertebral artery, and bilateral cavernous carotid arteries. Electronically Signed   By: Feliberto Harts MD   On: 05/28/2020 16:16    Procedures Procedures (including critical care time)  Medications Ordered in ED Medications  diphenhydrAMINE (BENADRYL) injection 12.5 mg (12.5 mg Intravenous Given 05/28/20 1709)  prochlorperazine (COMPAZINE) injection 5 mg (5 mg Intravenous Given 05/28/20 1707)  iohexol (OMNIPAQUE) 350 MG/ML injection 100 mL (100 mLs Intravenous Contrast Given 05/28/20 1550)    ED Course  I have reviewed the triage vital signs and the nursing notes.  Pertinent labs & imaging results that were available during my care of the patient were reviewed by me and considered in my medical decision making (see chart for details).  Clinical Course as of May 28 1909  Mon May 28, 2020  3549 64 year old male prior history of stroke here with headache and imbalance along with some left-sided weakness.  Unable to get an MRI due to AICD.  Getting lab work head CT will need a neuro consult.   [MB]    Clinical Course User Index [MB] Terrilee Files, MD   MDM Rules/Calculators/A&P                          CC: ams VS: BP 123/63   Pulse 81   Temp 98.3 F (36.8 C) (Oral)   Resp 20   Ht 5\' 11"  (1.803 m)   Wt 108.9 kg   SpO2 96%   BMI 33.47 kg/m   is gathered by patient and emr. Previous records obtained and reviewed. DDX:The patient's complaint of ams involves an extensive number of diagnostic and treatment options, and is a complaint that carries with it a high risk of complications, morbidity, and potential mortality. Given the large differential diagnosis, medical decision making is of high  complexity. The differential diagnosis for AMS is extensive and includes, but is not  limited to: drug overdose - opioids, alcohol, sedatives, antipsychotics, drug withdrawal, others; Metabolic: hypoxia, hypoglycemia, hyperglycemia, hypercalcemia, hypernatremia, hyponatremia, uremia, hepatic encephalopathy, hypothyroidism, hyperthyroidism, vitamin B12 or thiamine deficiency, carbon monoxide poisoning, Wilson's disease, Lactic acidosis, DKA/HHOS; Infectious: meningitis, encephalitis, bacteremia/sepsis, urinary tract infection, pneumonia, neurosyphilis; Structural: Space-occupying lesion, (brain tumor, subdural hematoma, hydrocephalus,); Vascular: stroke, subarachnoid hemorrhage, coronary ischemia, hypertensive encephalopathy, CNS vasculitis, thrombotic thrombocytopenic purpura, disseminated intravascular coagulation, hyperviscosity; Psychiatric: Schizophrenia, depression; Other: Seizure, hypothermia, heat stroke, ICU psychosis, dementia -"sundowning."  Labs: I ordered reviewed and interpreted labs which include CBC which shows normocytic anemia of insignificant value, CMP with slightly elevated creatinine of 1.35 no significant abnormalities otherwise.  Alcohol, aPTT and PT/INR within normal limits.   Imaging: I ordered and reviewed images which included CT angio of the head and neck. I independently visualized and interpreted all imaging. There are no acute, significant findings on today's images. EKG: Normal sinus rhythm at a rate of 76 Consults: Dr. Lissa [name] discussed all work-up and findings.  Although he does not need an emergent MRI patient is unable to safely ambulate as he is very unsteady with ataxia and disequilibrium.  I discussed this and patient will be admitted to Dr. Mariea Clonts of the hospitalist service for MRI at [facility] as he will need his AICD taken care of for MRI. MDM: Patient here with weakness, ataxia and headache.  I reviewed patient CT scan which shows no acute abnormalities.  Patient  does need further imaging and due to his disequilibrium and unsteady gait will come into the hospital for MRI and further management.  He will need to go to [facility] because of his AICD. Patient disposition:The patient appears reasonably stabilized for admission considering the current resources, flow, and capabilities available in the ED at this time, and I doubt any other [facility] requiring further screening and/or treatment in the ED prior to admission.         Final Clinical Impression(s) / ED Diagnoses Final diagnoses:  Bad headache  Ataxia  Left hemiparesis [facility])  Disequilibrium    Rx / DC Orders ED Discharge Orders    None       Arthor Captain, PA-C 05/28/20 1912    Terrilee Files, MD 05/29/20 [phone]

## 2020-05-28 NOTE — ED Notes (Signed)
Pt states he had a previous stroke x1 year ago. States his left sided weakness is new.

## 2020-05-29 ENCOUNTER — Inpatient Hospital Stay (HOSPITAL_COMMUNITY): Payer: No Typology Code available for payment source

## 2020-05-29 ENCOUNTER — Encounter (HOSPITAL_COMMUNITY): Payer: Self-pay | Admitting: Family Medicine

## 2020-05-29 DIAGNOSIS — D649 Anemia, unspecified: Secondary | ICD-10-CM | POA: Diagnosis present

## 2020-05-29 DIAGNOSIS — I6389 Other cerebral infarction: Secondary | ICD-10-CM | POA: Diagnosis not present

## 2020-05-29 DIAGNOSIS — Z7982 Long term (current) use of aspirin: Secondary | ICD-10-CM | POA: Diagnosis not present

## 2020-05-29 DIAGNOSIS — I255 Ischemic cardiomyopathy: Secondary | ICD-10-CM | POA: Diagnosis present

## 2020-05-29 DIAGNOSIS — E1151 Type 2 diabetes mellitus with diabetic peripheral angiopathy without gangrene: Secondary | ICD-10-CM | POA: Diagnosis present

## 2020-05-29 DIAGNOSIS — E1140 Type 2 diabetes mellitus with diabetic neuropathy, unspecified: Secondary | ICD-10-CM | POA: Diagnosis present

## 2020-05-29 DIAGNOSIS — Z79899 Other long term (current) drug therapy: Secondary | ICD-10-CM | POA: Diagnosis not present

## 2020-05-29 DIAGNOSIS — Z9049 Acquired absence of other specified parts of digestive tract: Secondary | ICD-10-CM | POA: Diagnosis not present

## 2020-05-29 DIAGNOSIS — E669 Obesity, unspecified: Secondary | ICD-10-CM | POA: Diagnosis present

## 2020-05-29 DIAGNOSIS — R270 Ataxia, unspecified: Secondary | ICD-10-CM | POA: Diagnosis present

## 2020-05-29 DIAGNOSIS — G8929 Other chronic pain: Secondary | ICD-10-CM | POA: Diagnosis present

## 2020-05-29 DIAGNOSIS — Z981 Arthrodesis status: Secondary | ICD-10-CM | POA: Diagnosis not present

## 2020-05-29 DIAGNOSIS — R531 Weakness: Secondary | ICD-10-CM | POA: Diagnosis not present

## 2020-05-29 DIAGNOSIS — I110 Hypertensive heart disease with heart failure: Secondary | ICD-10-CM | POA: Diagnosis present

## 2020-05-29 DIAGNOSIS — G8194 Hemiplegia, unspecified affecting left nondominant side: Secondary | ICD-10-CM | POA: Diagnosis present

## 2020-05-29 DIAGNOSIS — N179 Acute kidney failure, unspecified: Secondary | ICD-10-CM | POA: Diagnosis present

## 2020-05-29 DIAGNOSIS — E11621 Type 2 diabetes mellitus with foot ulcer: Secondary | ICD-10-CM | POA: Diagnosis present

## 2020-05-29 DIAGNOSIS — L97509 Non-pressure chronic ulcer of other part of unspecified foot with unspecified severity: Secondary | ICD-10-CM | POA: Diagnosis present

## 2020-05-29 DIAGNOSIS — Z7984 Long term (current) use of oral hypoglycemic drugs: Secondary | ICD-10-CM | POA: Diagnosis not present

## 2020-05-29 DIAGNOSIS — Z95810 Presence of automatic (implantable) cardiac defibrillator: Secondary | ICD-10-CM | POA: Diagnosis not present

## 2020-05-29 DIAGNOSIS — I10 Essential (primary) hypertension: Secondary | ICD-10-CM | POA: Diagnosis not present

## 2020-05-29 DIAGNOSIS — Z79891 Long term (current) use of opiate analgesic: Secondary | ICD-10-CM | POA: Diagnosis not present

## 2020-05-29 DIAGNOSIS — I428 Other cardiomyopathies: Secondary | ICD-10-CM | POA: Diagnosis not present

## 2020-05-29 DIAGNOSIS — I5032 Chronic diastolic (congestive) heart failure: Secondary | ICD-10-CM | POA: Diagnosis present

## 2020-05-29 DIAGNOSIS — E1149 Type 2 diabetes mellitus with other diabetic neurological complication: Secondary | ICD-10-CM | POA: Diagnosis not present

## 2020-05-29 DIAGNOSIS — Z20822 Contact with and (suspected) exposure to covid-19: Secondary | ICD-10-CM | POA: Diagnosis present

## 2020-05-29 DIAGNOSIS — Z885 Allergy status to narcotic agent status: Secondary | ICD-10-CM | POA: Diagnosis not present

## 2020-05-29 DIAGNOSIS — Z91010 Allergy to peanuts: Secondary | ICD-10-CM | POA: Diagnosis not present

## 2020-05-29 DIAGNOSIS — Z823 Family history of stroke: Secondary | ICD-10-CM | POA: Diagnosis not present

## 2020-05-29 LAB — ECHOCARDIOGRAM COMPLETE
Area-P 1/2: 3.68 cm2
Height: 71 in
S' Lateral: 3.78 cm
Weight: 3840 oz

## 2020-05-29 LAB — HIV ANTIBODY (ROUTINE TESTING W REFLEX): HIV Screen 4th Generation wRfx: NONREACTIVE

## 2020-05-29 LAB — HEMOGLOBIN A1C
Hgb A1c MFr Bld: 7.3 % — ABNORMAL HIGH (ref 4.8–5.6)
Mean Plasma Glucose: 162.81 mg/dL

## 2020-05-29 LAB — GLUCOSE, CAPILLARY
Glucose-Capillary: 168 mg/dL — ABNORMAL HIGH (ref 70–99)
Glucose-Capillary: 138 mg/dL — ABNORMAL HIGH (ref 70–99)

## 2020-05-29 LAB — LIPID PANEL
Cholesterol: 84 mg/dL (ref 0–200)
HDL: 27 mg/dL — ABNORMAL LOW (ref >40)
LDL Cholesterol: 17 mg/dL (ref 0–99)
Total CHOL/HDL Ratio: 3.1 RATIO
Triglycerides: 200 mg/dL — ABNORMAL HIGH (ref <150)
VLDL: 40 mg/dL (ref 0–40)

## 2020-05-29 LAB — RESP PANEL BY RT-PCR (FLU A&B, COVID) ARPGX2
Influenza A by PCR: NEGATIVE
Influenza B by PCR: NEGATIVE
SARS Coronavirus 2 by RT PCR: NEGATIVE

## 2020-05-29 LAB — CBG MONITORING, ED
Glucose-Capillary: 149 mg/dL — ABNORMAL HIGH (ref 70–99)
Glucose-Capillary: 262 mg/dL — ABNORMAL HIGH (ref 70–99)

## 2020-05-29 MED ORDER — PERFLUTREN LIPID MICROSPHERE
1.0000 mL | INTRAVENOUS | Status: AC | PRN
  Administered 2020-05-29: 1 mL via INTRAVENOUS
  Filled 2020-05-29: qty 10

## 2020-05-29 NOTE — Progress Notes (Signed)
*  PRELIMINARY RESULTS* Echocardiogram 2D Echocardiogram with definity has been performed.  Jeryl Columbia 05/29/2020, 3:34 PM

## 2020-05-29 NOTE — Progress Notes (Addendum)
[facility]  PROGRESS NOTE  Dalton Porter YKD:[id number] DOB: 03-24-1956 DOA: 05/28/2020 [facility]CP[facility]   Brief HPI:   64 year old male with history of stroke, diabetes mellitus type 2, cardiomyopathy came to ED with complaints of intermittent headaches going on for past 6 weeks patient said that he has had headaches for 20 to 30 years.  Patient reports that over past 1 week he had left-sided upper and lower extremity weakness and difficulty finding words.  Patient has AICD in place, plan to get MRI with and without contrast to be done at [facility].  AICD has to be switched out for that.    Subjective   Patient seen and examined, continues to have left upper and lower extremity weakness, though it is very mild.   Assessment/Plan:     1. Left-sided weakness-patient had difficulty finding words for 1 week, headaches for 6 weeks.  Also had minimal weakness of left upper and lower extremity weakness CT head and neck showed no acute abnormalities.  Neurology recommended to admission to [facility] for MRI.  AICD has to be switched off before MRI.  Carotid Dopplers deferred due to CTA neck done, echocardiogram, daily aspirin 325 mg daily, daily statin dose increased to 40 mg daily. 2. Acute kidney injury-patient's BUN/creatinine is 21/1.40, creatinine is up from 1.21 on 05/24/2019.  Will hold p.o. Lasix at this time.  Follow BMP in am. 3. Diabetes mellitus with neuropathy-hold glipizide, Metformin.  Continue sliding scale insulin with NovoLog. 4. Hypertension-resume Entresto, spironolactone,  metoprolol. 5. Ischemic cardiomyopathy, AICD status-follows Dr. Johney Frame, last echo from 2020 showed EF of 50 to 55% with impaired LV relaxation.  Resume aspirin, statins.  Resume Lasix, Entresto, Aldactone, metoprolol.    Scheduled medications:   .  stroke: mapping our early stages of recovery book   Does not apply Once  . aspirin  325 mg Oral Daily  . atorvastatin  20 mg Oral Daily   . enoxaparin (LOVENOX) injection  40 mg Subcutaneous Q24H  . furosemide  40 mg Oral Daily  . gabapentin  300 mg Oral TID  . glipiZIDE  5 mg Oral BID AC  . insulin aspart  0-5 Units Subcutaneous QHS  . insulin aspart  0-9 Units Subcutaneous TID WC  . metoprolol succinate  25 mg Oral BID  . sacubitril-valsartan  1 tablet Oral BID  . spironolactone  25 mg Oral Daily  . traZODone  100 mg Oral QHS         CBG: Recent Labs  Lab 05/28/20 2320 05/29/20 0909  GLUCAP 152* 149*    SpO2: 98 %    CBC: Recent Labs  Lab 05/28/20 1420 05/28/20 1423  WBC 7.9  --   NEUTROABS 5.0  --   HGB 12.1* 11.6*  HCT 37.2* 34.0*  MCV 94.9  --   PLT 237  --     Basic Metabolic Panel: Recent Labs  Lab 05/28/20 1420 05/28/20 1423  NA 137 139  K 4.8 4.8  CL 104 105  CO2 23  --   GLUCOSE 77 78  BUN 21 21  CREATININE 1.35* 1.40*  CALCIUM 8.7*  --      Liver Function Tests: Recent Labs  Lab 05/28/20 1420  AST 22  ALT 25  ALKPHOS 48  BILITOT 0.5  PROT 7.5  ALBUMIN 3.9     Antibiotics: Anti-infectives (From admission, onward)   None       DVT prophylaxis: Lovenox  Code Status: Full  code  Family Communication: No family at bedside   Consultants:    Procedures:      Objective   Vitals:   05/29/20 0915 05/29/20 0930 05/29/20 0945 05/29/20 1000  BP:  (!) 129/54  (!) 123/50  Pulse: 67 72 78 66  Resp: 11 17 17 14   Temp:      TempSrc:      SpO2: 97% 96% 98% 98%  Weight:      Height:        Intake/Output Summary (Last 24 hours) at 05/29/2020 1132 Last data filed at 05/29/2020 05/31/2020 Gross per 24 hour  Intake --  Output 600 ml  Net -600 ml    No intake/output data recorded.  Filed Weights   05/28/20 1115 05/28/20 1121  Weight: 108.9 kg 108.9 kg    Physical Examination:   General-appears in no acute distress Heart-S1-S2, regular, no murmur auscultated Lungs-clear to auscultation bilaterally, no wheezing or crackles  auscultated Abdomen-soft, nontender, no organomegaly Extremities-no edema in the lower extremities Neuro-alert, oriented x3, mild left upper and lower extremity weakness  Status is: Inpatient  Dispo: The patient is from:  Home              Anticipated d/c is to: Home              Anticipated d/c date is: 05-31-20              Patient currently not medically stable for discharge  Barrier to discharge- ongoing work-up for stroke      Studies:  CT Angio Head W or Wo Contrast  Result Date: 05/28/2020 CLINICAL DATA:  Dizziness.  Headache. EXAM: CT ANGIOGRAPHY HEAD AND NECK TECHNIQUE: Multidetector CT imaging of the head and neck was performed using the standard protocol during bolus administration of intravenous contrast. Multiplanar CT image reconstructions and MIPs were obtained to evaluate the vascular anatomy. Carotid stenosis measurements (when applicable) are obtained utilizing NASCET criteria, using the distal internal carotid diameter as the denominator. CONTRAST:  05/30/2020 OMNIPAQUE IOHEXOL 350 MG/ML SOLN COMPARISON:  CTA Aug 27, 2018 FINDINGS: CT HEAD FINDINGS Brain: No evidence of acute large vascular territory infarction, hemorrhage, hydrocephalus, extra-axial collection or mass lesion/mass effect. Skull: No acute fracture. Sinuses: No acute finding. Orbits: No acute finding. Review of the MIP images confirms the above findings CTA NECK FINDINGS Aortic arch: Imaged portion shows no evidence of aneurysm or dissection. No significant stenosis of the major arch vessel origins. Right carotid system: No evidence of dissection, stenosis (50% or greater) or occlusion. Left carotid system: No evidence of dissection, stenosis (50% or greater) or occlusion. Mixed atherosclerosis at the bifurcation. Vertebral arteries: Codominant. No evidence of dissection, stenosis (50% or greater) or occlusion. Calcific atherosclerosis of the intradural left vertebral artery with mild associated narrowing. Skeleton:  No evidence of acute fracture. Other neck: No mass or suspicious adenopathy. Upper chest: Negative. Review of the MIP images confirms the above findings CTA HEAD FINDINGS Anterior circulation: No significant stenosis, proximal occlusion, aneurysm, or vascular malformation. Calcific atherosclerosis of bilateral cavernous carotid arteries. Posterior circulation: No significant stenosis, proximal occlusion, aneurysm, or vascular malformation. Venous sinuses: As permitted by contrast timing, patent. Review of the MIP images confirms the above findings IMPRESSION: 1. No evidence of acute intracranial abnormality. 2. No evidence of acute dissection, hemodynamically significant proximal stenosis, or large vessel occlusion in the head or neck. 3. Mild atherosclerotic disease involving the left carotid bifurcation, left V4 vertebral artery, and bilateral cavernous carotid arteries. Electronically  Signed   By: Feliberto Harts MD   On: 05/28/2020 16:16   CT Angio Neck W and/or Wo Contrast  Result Date: 05/28/2020 CLINICAL DATA:  Dizziness.  Headache. EXAM: CT ANGIOGRAPHY HEAD AND NECK TECHNIQUE: Multidetector CT imaging of the head and neck was performed using the standard protocol during bolus administration of intravenous contrast. Multiplanar CT image reconstructions and MIPs were obtained to evaluate the vascular anatomy. Carotid stenosis measurements (when applicable) are obtained utilizing NASCET criteria, using the distal internal carotid diameter as the denominator. CONTRAST:  OMNIPAQUE IOHEXOL 350 MG/ML SOLN COMPARISON:  CTA Aug 27, 2018 FINDINGS: CT HEAD FINDINGS Brain: No evidence of acute large vascular territory infarction, hemorrhage, hydrocephalus, extra-axial collection or mass lesion/mass effect. Skull: No acute fracture. Sinuses: No acute finding. Orbits: No acute finding. Review of the MIP images confirms the above findings CTA NECK FINDINGS Aortic arch: Imaged portion shows no evidence of  aneurysm or dissection. No significant stenosis of the major arch vessel origins. Right carotid system: No evidence of dissection, stenosis (50% or greater) or occlusion. Left carotid system: No evidence of dissection, stenosis (50% or greater) or occlusion. Mixed atherosclerosis at the bifurcation. Vertebral arteries: Codominant. No evidence of dissection, stenosis (50% or greater) or occlusion. Calcific atherosclerosis of the intradural left vertebral artery with mild associated narrowing. Skeleton: No evidence of acute fracture. Other neck: No mass or suspicious adenopathy. Upper chest: Negative. Review of the MIP images confirms the above findings CTA HEAD FINDINGS Anterior circulation: No significant stenosis, proximal occlusion, aneurysm, or vascular malformation. Calcific atherosclerosis of bilateral cavernous carotid arteries. Posterior circulation: No significant stenosis, proximal occlusion, aneurysm, or vascular malformation. Venous sinuses: As permitted by contrast timing, patent. Review of the MIP images confirms the above findings IMPRESSION: 1. No evidence of acute intracranial abnormality. 2. No evidence of acute dissection, hemodynamically significant proximal stenosis, or large vessel occlusion in the head or neck. 3. Mild atherosclerotic disease involving the left carotid bifurcation, left V4 vertebral artery, and bilateral cavernous carotid arteries. Electronically Signed   By: Feliberto Harts MD   On: 05/28/2020 16:16       Meredeth Ide   [facility] If 7PM-7AM, please contact night-coverage at [email or web address], Office  [phone]   05/29/2020, 11:32 AM  LOS: 0 days

## 2020-05-30 ENCOUNTER — Inpatient Hospital Stay (HOSPITAL_COMMUNITY): Payer: No Typology Code available for payment source

## 2020-05-30 DIAGNOSIS — G8194 Hemiplegia, unspecified affecting left nondominant side: Secondary | ICD-10-CM | POA: Diagnosis not present

## 2020-05-30 DIAGNOSIS — I10 Essential (primary) hypertension: Secondary | ICD-10-CM | POA: Diagnosis not present

## 2020-05-30 DIAGNOSIS — R531 Weakness: Secondary | ICD-10-CM | POA: Diagnosis not present

## 2020-05-30 DIAGNOSIS — I428 Other cardiomyopathies: Secondary | ICD-10-CM

## 2020-05-30 DIAGNOSIS — E1149 Type 2 diabetes mellitus with other diabetic neurological complication: Secondary | ICD-10-CM | POA: Diagnosis not present

## 2020-05-30 LAB — BASIC METABOLIC PANEL
Anion gap: 10 (ref 5–15)
BUN: 21 mg/dL (ref 8–23)
CO2: 24 mmol/L (ref 22–32)
Calcium: 9.3 mg/dL (ref 8.9–10.3)
Chloride: 102 mmol/L (ref 98–111)
Creatinine, Ser: 1.3 mg/dL — ABNORMAL HIGH (ref 0.61–1.24)
GFR, Estimated: >60 mL/min (ref >60)
Glucose, Bld: 146 mg/dL — ABNORMAL HIGH (ref 70–99)
Potassium: 4.5 mmol/L (ref 3.5–5.1)
Sodium: 136 mmol/L (ref 135–145)

## 2020-05-30 LAB — GLUCOSE, CAPILLARY
Glucose-Capillary: 277 mg/dL — ABNORMAL HIGH (ref 70–99)
Glucose-Capillary: 275 mg/dL — ABNORMAL HIGH (ref 70–99)
Glucose-Capillary: 175 mg/dL — ABNORMAL HIGH (ref 70–99)

## 2020-05-30 LAB — C-REACTIVE PROTEIN: CRP: 3 mg/dL — ABNORMAL HIGH (ref <1.0)

## 2020-05-30 LAB — VITAMIN B12: Vitamin B-12: 398 pg/mL (ref 180–914)

## 2020-05-30 LAB — SEDIMENTATION RATE: Sed Rate: 67 mm/hr — ABNORMAL HIGH (ref 0–16)

## 2020-05-30 LAB — CK: Total CK: 85 U/L (ref 49–397)

## 2020-05-30 MED ORDER — GADOBUTROL 1 MMOL/ML IV SOLN
9.0000 mL | Freq: Once | INTRAVENOUS | Status: AC | PRN
  Administered 2020-05-30: 9 mL via INTRAVENOUS

## 2020-05-30 MED ORDER — VITAMIN B-12 100 MCG PO TABS
500.0000 ug | ORAL_TABLET | Freq: Every day | ORAL | Status: DC
  Administered 2020-05-30 – 2020-05-31 (×2): 500 ug via ORAL
  Filled 2020-05-30 (×2): qty 5

## 2020-05-30 NOTE — Plan of Care (Signed)
  Problem: Education: Goal: Knowledge of General Education information will improve Description: Including pain rating scale, medication(s)/side effects and non-pharmacologic comfort measures Outcome: Progressing   Problem: Health Behavior/Discharge Planning: Goal: Ability to manage health-related needs will improve Outcome: Progressing   Problem: Clinical Measurements: Goal: Ability to maintain clinical measurements within normal limits will improve Outcome: Progressing Goal: Will remain free from infection Outcome: Progressing Goal: Diagnostic test results will improve Outcome: Progressing Goal: Respiratory complications will improve Outcome: Progressing Goal: Cardiovascular complication will be avoided Outcome: Progressing   Problem: Activity: Goal: Risk for activity intolerance will decrease Outcome: Progressing   Problem: Elimination: Goal: Will not experience complications related to bowel motility Outcome: Progressing Goal: Will not experience complications related to urinary retention Outcome: Progressing   Problem: Pain Managment: Goal: General experience of comfort will improve Outcome: Progressing   Problem: Safety: Goal: Ability to remain free from injury will improve Outcome: Progressing   Problem: Skin Integrity: Goal: Risk for impaired skin integrity will decrease Outcome: Progressing   Problem: Education: Goal: Knowledge of disease or condition will improve Outcome: Progressing Goal: Knowledge of secondary prevention will improve Outcome: Progressing Goal: Knowledge of patient specific risk factors addressed and post discharge goals established will improve Outcome: Progressing   Problem: Coping: Goal: Will verbalize positive feelings about self Outcome: Progressing

## 2020-05-30 NOTE — Progress Notes (Signed)
OT Cancellation Note  Patient Details Name: Dalton Porter MRN: [id number] DOB: October 07, 1955   Cancelled Treatment:    Reason Eval/Treat Not Completed: Patient at procedure or test/ unavailable (MRI)  Wynona Neat, OTR/L  Acute Rehabilitation Services Pager: [phone] Office: [phone] .  05/30/2020, 10:42 AM

## 2020-05-30 NOTE — Progress Notes (Signed)
PT Cancellation Note  Patient Details Name: Dalton Porter MRN: [id number] DOB: [birth year]   Cancelled Treatment:    Reason Eval/Treat Not Completed: Patient at procedure or test/unavailable. Pt getting MRI. Will follow-up later in day for PT eval.   Raymond Gurney, PT, DPT Acute Rehabilitation Services  Pager: [phone] Office: [phone]    Jewel Baize 05/30/2020, 1:02 PM

## 2020-05-30 NOTE — Consult Note (Signed)
WOC Nurse Consult Note: Reason for Consult: DM foot ulcer Wound type:  Trauma, patient reports "bumping" toe Pressure Injury POA: NA Measurement: 0.2cm x 0.2cmx 0 Wound AYG:EFUWTKT  Drainage (amount, consistency, odor) none Periwound: intact  Apply bandaid to protect while inpatient. Change PRN soilage.    Re consult if needed, will not follow at this time. Thanks  Heily Carlucci [employer], RN,CWOCN, CNS, CWON-AP [phone])

## 2020-05-30 NOTE — Evaluation (Signed)
Physical Therapy Evaluation Patient Details Name: Dalton Porter MRN: [id number] DOB: Dec 26, 1955 Today's Date: 05/30/2020   History of Present Illness  Pt is a 64 yo male admitted with impaired balance with recent falls, impaired sensation, increased confusion, headache, L side weakness, and difficulty finding words. CT of head was negative for any acute abnormalities. Awaiting MRI results at this time. PMH: CVA DM2 cardiomyopahty ACID CHF chronic back pain back surg x3. NIHSS = 3.  Clinical Impression  Pt presents with condition mentioned above and deficits mentioned below, see PT Problem List. He lives with his wife and has recently been needing increased assistance from her due his balance changes. He experiences numbness in his R dorsal foot and complete lack of sensation in his L dorsal foot with light touch along with delayed dynamic proprioception in his L ankle. These deficits in sensation and proprioception impact his ability to detect harmful objects or LOB quick enough to respond. He also displays dysmetria in bilat legs and dysdiadochokinesia in his L leg, impacting his coordination and thus placing him at risk for falls. Furthermore, his lack of strength in his L leg muscles compared to his R also place him at risk for falls. He ambulates up to ~200 ft with a RW and min guard assist without LOB at a slow speed with poor bilat feet clearance, especially on the L. Pt can also navigate 4 stairs with 1-2 hand rails with a step-to pattern and no LOB with min guard assist. He is able to perform all bed mob with modified independence and transfers with supervision. Pt has been educated on utilizing the RW at home to increase his safety with mobility as his balance is impaired. Will continue to follow acutely. Recommending neuro specialty outpatient PT upon d/c to address his deficits to maximize his independence and safety with all functional mobility.    Follow Up Recommendations Outpatient  PT;Supervision for mobility/OOB (neuro specialty clinic)    Equipment Recommendations  None recommended by PT    Recommendations for Other Services       Precautions / Restrictions Precautions Precautions: Fall Precaution Comments: no falls but reports near fall and fear of falling Restrictions Weight Bearing Restrictions: No      Mobility  Bed Mobility Overal bed mobility: Modified Independent             General bed mobility comments: Ue of bed rails to assist with return to supine.    Transfers Overall transfer level: Needs assistance Equipment used: Rolling walker (2 wheeled) Transfers: Sit to/from Stand Sit to Stand: Supervision         General transfer comment: provided RW to help with stability with task. No overt LOB, supervision for safety.  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 Demonstrates decreased bilat ankle dorsiflexion and foot clearance (L>R), which improved minimally when cued to correct. No LOB, min guard for safety.  Stairs Stairs: Yes Stairs assistance: Min guard Stair Management: Two rails;One rail Right;Step to pattern Number of Stairs: 4 General stair comments: Initial 2 steps utilized bilat hand rails but progressed to R hand rail during final 2 steps. Min guard for  safety, but no LOB. Extra time required to complete, descending leading with L.   Wheelchair Mobility    Modified Rankin (Stroke Patients Only) Modified Rankin (Stroke Patients  Only) Pre-Morbid Rankin Score: Moderately severe disability Modified Rankin: Moderately severe disability     Balance Overall balance assessment: Needs assistance Sitting-balance support: No upper extremity supported;Feet supported Sitting balance-Leahy Scale: Good Sitting balance - Comments: Static sitting no LOB, supervision for safety.   Standing balance support: During functional activity;No upper extremity supported;Bilateral upper extremity supported;Single extremity supported Standing balance-Leahy Scale: Fair Standing balance comment: Pt able to reach minimally off BOS standing with 1-no UE support, no LOB. Otherwise, bilat UE on RW during functional mobility. Min guard for safety.                             Pertinent Vitals/Pain Pain Assessment: No/denies pain    Home Living Family/patient expects to be discharged to:: Private residence Living Arrangements: Spouse/significant other Available Help at Discharge: Family;Available 24 hours/day Type of Home: House Home Access: Stairs to enter Entrance Stairs-Rails: None Entrance Stairs-Number of Steps: 3 Home Layout: One level Home Equipment: Walker - 2 wheels;Walker - 4 wheels Additional Comments: has a 58 yo dog named Pepper    Prior Function Level of Independence: Independent;Needs assistance   Gait / Transfers Assistance Needed: reports needing wife (A) recently due to balance changes  ADL's / Homemaking Assistance Needed: wife reports "watching him like a hawk" and setting up the house to decr fall risk by bringing things to him        Hand Dominance   Dominant Hand: Right    Extremity/Trunk Assessment   Upper Extremity Assessment Upper Extremity Assessment: Defer to OT evaluation RUE Deficits / Details: noted to have tremor that he reports started within last 10 days RUE Coordination: decreased fine motor;decreased gross motor LUE Deficits / Details: noted to have tremor that he reports started  within last 10 days LUE Coordination: decreased fine motor;decreased gross motor    Lower Extremity Assessment Lower Extremity Assessment: RLE deficits/detail;LLE deficits/detail RLE Deficits / Details: MMT scores of 4+ to 5 grossly throughout RLE Sensation: decreased light touch (dyn proprio intact; numb dorsal foot) RLE Coordination: decreased fine motor;decreased gross motor (dysmetria noted) LLE Deficits / Details: MMT scores of the following = hip flexion 4, knee extension 4 (compensation through hip), knee flexion 4-, ankle dorsiflexion 3+ LLE Sensation: decreased light touch;decreased proprioception (dyn prop intact knee, delayed ankle; no sens dorsal foot) LLE Coordination: decreased fine motor;decreased gross motor (dysmetria and dysdiadochokinesia noted)    Cervical / Trunk Assessment Cervical / Trunk Assessment: Kyphotic  Communication   Communication: No difficulties  Cognition Arousal/Alertness: Awake/alert Behavior During Therapy: WFL for tasks assessed/performed Overall Cognitive Status: Within Functional Limits for tasks assessed                                        General Comments General comments (skin integrity, edema, etc.): HR rose up to 120 bpm following stair bout but then decreased, SpO2>/= 98% throughout session, BP WNL start and end of session with no drastic changes    Exercises     Assessment/Plan    PT Assessment Patient needs continued PT services  PT Problem List Decreased strength;Decreased balance;Decreased mobility;Decreased coordination;Impaired sensation;Obesity       PT Treatment Interventions DME instruction;Gait  training;Stair training;Functional mobility training;Therapeutic activities;Therapeutic exercise;Balance training;Neuromuscular re-education;Patient/family education    PT Goals (Current goals can be found in the Care Plan section)  Acute Rehab PT Goals Patient Stated Goal: to not fall  PT Goal Formulation:  With patient/family Time For Goal Achievement: 06/13/20 Potential to Achieve Goals: Good    Frequency Min 3X/week   Barriers to discharge        Co-evaluation PT/OT/SLP Co-Evaluation/Treatment: Yes Reason for Co-Treatment: For patient/therapist safety;To address functional/ADL transfers           AM-PAC PT "6 Clicks" Mobility  Outcome Measure Help needed turning from your back to your side while in a flat bed without using bedrails?: None Help needed moving from lying on your back to sitting on the side of a flat bed without using bedrails?: None Help needed moving to and from a bed to a chair (including a wheelchair)?: A Little Help needed standing up from a chair using your arms (e.g., wheelchair or bedside chair)?: None Help needed to walk in hospital room?: A Little Help needed climbing 3-5 steps with a railing? : A Little 6 Click Score: 21    End of Session Equipment Utilized During Treatment: Gait belt Activity Tolerance: Patient tolerated treatment well Patient left: in bed;with call bell/phone within reach;with bed alarm set;with family/visitor present Nurse Communication: Mobility status PT Visit Diagnosis: Unsteadiness on feet (R26.81);Other abnormalities of gait and mobility (R26.89);Muscle weakness (generalized) (M62.81);Difficulty in walking, not elsewhere classified (R26.2);Other symptoms and signs involving the nervous system (R29.898)    Time: 6578-4696 PT Time Calculation (min) (ACUTE ONLY): 21 min   Charges:   PT Evaluation $PT Eval Moderate Complexity: 1 Mod          Raymond Gurney, PT, DPT Acute Rehabilitation Services  Pager: [phone] Office: [phone]   Jewel Baize 05/30/2020, 2:58 PM

## 2020-05-30 NOTE — Progress Notes (Signed)
PROGRESS NOTE    Dalton Porter  WLK:[id number] DOB: [birth year] DOA: 05/28/2020 [facility]    Chief Complaint  Patient presents with  . Cerebrovascular Accident    Brief Narrative:   64 year old gentleman prior history of type 2 diabetes, s/p AICD for ischemic cardiomyopathy, stroke, cardiomyopathy presents to ED with complaints of intermittent headache going on for the last 6 weeks.  Patient also reported some left-sided upper and lower extremity weakness and difficulty finding words for about 1 week.  He was admitted for evaluation of stroke. Patient needed to be transferred to [facility] and AICD needs to be switched off for the MRI to be done. Assessment & Plan:   Principal Problem:   Left-sided weakness Active Problems:   Diabetes mellitus type 2 with neurological manifestations (HCC)   HTN (hypertension)   Nonischemic cardiomyopathy (HCC)  Left-sided weakness, difficulty finding words, mild headaches for up to 6 weeks,  CT of the head and neck showed no acute abnormalities.  Neurology recommended transfer the patient to [facility] for MRI. AICD needs to be switched off before the MRI. MRI of the brain without contrast is pending.  MRI of the cervical spine without and with contrast.  Echocardiogram showed left ventricular ejection fraction of 40 to 45% with global hypokinesis and indeterminate left ventricular diastolic parameters. Elevated CRP concerning for GCA, CK level is within normal limits. Neurology recommends if the MRI of the brain and C-spine does not show any acute process recommend outpatient follow-up.    Peripheral neuropathy SPEP, UPEP, vitamin B12 levels.  ANA, anti-Ro anti-La as per neurology.  Outpatient follow-up with EM[facility]CS Continue with gabapentin 300 mg 3 times daily.   Essential hypertension Continue with Entresto, spironolactone and metoprolol.     History of ischemic cardiomyopathy s/p AICD. Echocardiogram this  admission showed decrease in left ventricular ejection fraction from 50% to 45% with global hypokinesis. Recommend outpatient follow-up with Dr. Johney Frame. Continue with aspirin 325 mg, Lipitor 20 mg, metoprolol 25 mg twice daily, Entresto 24-26 mg twice daily Spironolactone 25 mg daily.   Type 2 diabetes mellitus Last A1c is 7.3 Continue with sliding scale insulin.   DVT prophylaxis: (Lovenox) Code Status: (Fullcode ) Family Communication: none at bedside.   Disposition:   Status is: Inpatient  Remains inpatient appropriate because:Ongoing diagnostic testing needed not appropriate for outpatient work up and Unsafe d/c plan   Dispo: The patient is from: Home              Anticipated d/c is to: pending              Anticipated d/c date is: 1 day              Patient currently is not medically stable to d/c.       Consultants:   Neurology.    Procedures: none.    Antimicrobials: none.    Subjective: No new complaints.   Objective: Vitals:   05/29/20 2348 05/30/20 0107 05/30/20 0300 05/30/20 0740  BP: (!) 85/46 103/70 131/70 (!) 143/70  Pulse: 90  73 77  Resp: 15 15 17 17   Temp: 98 F (36.7 C)  98 F (36.7 C) 98.2 F (36.8 C)  TempSrc: Oral  Oral Oral  SpO2: 100%  99% 98%  Weight:      Height:        Intake/Output Summary (Last 24 hours) at 05/30/2020 1231 Last data filed at 05/29/2020 2200 Gross per 24 hour  Intake --  Output 200 ml  Net -200 ml   Filed Weights   05/28/20 1115 05/28/20 1121  Weight: 108.9 kg 108.9 kg    Examination:  General exam: Appears calm and comfortable  Respiratory system: Clear to auscultation. Respiratory effort normal. Cardiovascular system: S1 & S2 heard, RRR. No JVD,. No pedal edema. Gastrointestinal system: Abdomen is nondistended, soft and nontender.  Normal bowel sounds heard. Central nervous system: Alert and oriented. No focal neurological deficits. Extremities: Symmetric 5 x 5 power. Skin: No rashes, lesions  or ulcers Psychiatry:  Mood & affect appropriate.     Data Reviewed: I have personally reviewed following labs and imaging studies  CBC: Recent Labs  Lab 05/28/20 1420 05/28/20 1423  WBC 7.9  --   NEUTROABS 5.0  --   HGB 12.1* 11.6*  HCT 37.2* 34.0*  MCV 94.9  --   PLT 237  --     Basic Metabolic Panel: Recent Labs  Lab 05/28/20 1420 05/28/20 1423 05/30/20 0222  NA 137 139 136  K 4.8 4.8 4.5  CL 104 105 102  CO2 23  --  24  GLUCOSE 77 78 146*  BUN 21 21 21   CREATININE 1.35* 1.40* 1.30*  CALCIUM 8.7*  --  9.3    GFR: Estimated Creatinine Clearance: 73.9 mL/min (A) (by C-G formula based on SCr of 1.3 mg/dL (H)).  Liver Function Tests: Recent Labs  Lab 05/28/20 1420  AST 22  ALT 25  ALKPHOS 48  BILITOT 0.5  PROT 7.5  ALBUMIN 3.9    CBG: Recent Labs  Lab 05/29/20 0909 05/29/20 1209 05/29/20 1834 05/29/20 2137 05/30/20 0926  GLUCAP 149* 262* 168* 138* 277*     Recent Results (from the past 240 hour(s))  Resp Panel by RT-PCR (Flu A&B, Covid) Nasopharyngeal Swab     Status: None   Collection Time: 05/29/20 12:14 PM   Specimen: Nasopharyngeal Swab; Nasopharyngeal(NP) swabs in vial transport medium  Result Value Ref Range Status   SARS Coronavirus 2 by RT PCR NEGATIVE NEGATIVE Final    Comment: (NOTE) SARS-CoV-2 target nucleic acids are NOT DETECTED.  The SARS-CoV-2 RNA is generally detectable in upper respiratory specimens during the acute phase of infection. The lowest concentration of SARS-CoV-2 viral copies this assay can detect is 138 copies/mL. A negative result does not preclude SARS-Cov-2 infection and should not be used as the sole basis for treatment or other patient management decisions. A negative result may occur with  improper specimen collection/handling, submission of specimen other than nasopharyngeal swab, presence of viral mutation(s) within the areas targeted by this assay, and inadequate number of viral copies(<138  copies/mL). A negative result must be combined with clinical observations, patient history, and epidemiological information. The expected result is Negative.  Fact Sheet for Patients:  05/31/20  Fact Sheet for Healthcare Providers:  BloggerCourse.com  This test is no t yet approved or cleared by the [employer] FDA and  has been authorized for detection and/or diagnosis of SARS-CoV-2 by FDA under an Emergency Use Authorization (EUA). This EUA will remain  in effect (meaning this test can be used) for the duration of the COVID-19 declaration under Section 564(b)(1) of the Act, 21 U.S.C.section 360bbb-3(b)(1), unless the authorization is terminated  or revoked sooner.       Influenza A by PCR NEGATIVE NEGATIVE Final   Influenza B by PCR NEGATIVE NEGATIVE Final    Comment: (NOTE) The Xpert Xpress SARS-CoV-2/FLU/RSV plus assay is intended as an aid in  PhN9E107Elesa Mas55sed23Loren Racerical CenterN952(941)20[name]8-6(O9 is, proximal occlusion, aneurysm, or vascular malformation. Venous sinuses: As permitted by contrast timing, patent. Review of the MIP images confirms the above findings IMPRESSION: 1.  No evidence of acute intracranial abnormality. 2. No evidence of acute dissection, hemodynamically significant proximal stenosis, or large vessel occlusion in the head or neck. 3. Mild atherosclerotic disease involving the left carotid bifurcation, left V4 vertebral artery, and bilateral cavernous carotid arteries. Electronically Signed   By: Frederick S Jones MD   On: 05/28/2020 16:16   CT Angio Neck W and/or Wo Contrast  Result Date: 05/28/2020 CLINICAL DATA:  Dizziness.  Headache. EXAM: CT ANGIOGRAPHY HEAD AND NECK TECHNIQUE: Multidetector CT imaging of the head and neck was performed using the standard protocol during bolus administration of intravenous contrast. Multiplanar CT image reconstructions and MIPs were obtained to evaluate the vascular anatomy. Carotid stenosis measurements (when applicable) are obtained utilizing NASCET criteria, using the distal internal carotid diameter as the denominator. CONTRAST:  <[id number] IOHEXOL 350 MG/ML SOLN COMPARISON:  CTA Aug 27, 2018 FINDINGS: CT HEAD FINDINGS Brain: No evidence of acute large vascular territory infarction, hemorrhage, hydrocephalus, extra-axial collection or mass lesion/mass effect. Skull: No acute fracture. Si18museSignatur74[facility]1mCeCrenshaw Community HospitalL45[facility] Surgery IncLCe Healthcare BNadar9<MEASURE123Lacy<[id number] Masseda3mLoren39 Ra rital91.Edua d (O96.231m569[facility] H4mspPrisma Health HiLLCrest HospitalSoutheas46[facility]ult[facility] OhioPickens Nadar946m284[facility]196-712Nadar952841610-504-792[name]1dMasse91.Eduard C(O96. 295.4enterl67[facility]27ml-SWest Cen47[facility]79miceMary Breckinridge Arh Hospital GMetro Atlanta [facility] LLC9[facility] HoNad<MEASURE123Lacy664403474neyTVMassed7m0Loren51 Ra rland Kitch n2d C53m953m.EdAmbulatory S67[facility]40murgBeckett [phone] Kitchennburg(O91.Eduard C(O96. 295n RacSelect Specialty Hospital21m- [facility]93[facility] Ch64mld2[facility] CampusPark[facility] H29[facility] Community HospitalospitaldolesceNadar952841248-17Elesa Massed4m-Loren43 Ra rE57[facility] General Hospitalduard Clo .4er  finding. Orbits: No acute finding. Review of the MIP images confirms the above findings CTA NECK FINDINGS Aortic arch: Imaged portion shows no evidence of aneurysm or dissection. No significant stenosis of the major arch vessel origins. Right carotid system: No evidence of dissection, stenosis (50% or greater) or occlusion. Left carotid system: No evidence of dissection, stenosis (50% or greater) or occlusion. Mixed atherosclerosis at the bifurcation. Vertebral arteries: Codominant. No evidence of dissection, stenosis (50% or greater) or occlusion. Calcific atherosclerosis of the intradural left vertebral artery with mild associated narrowing. Skeleton: No evidence of acute fracture. Other neck: No mass or suspicious  adenopathy. Upper chest: Negative. Review of the MIP images confirms the above findings CTA HEAD FINDINGS Anterior circulation: No significant stenosis, proximal occlusion, aneurysm, or vascular malformation. Calcific atherosclerosis of bilateral cavernous carotid arteries. Posterior circulation: No significant stenosis, proximal occlusion, aneurysm, or vascular malformation. Venous sinuses: As permitted by contrast timing, patent. Review of the MIP images confirms the above findings IMPRESSION: 1. No evidence of acute intracranial abnormality. 2. No evidence of acute dissection, hemodynamically significant proximal stenosis, or large vessel occlusion in the head or neck. 3. Mild atherosclerotic disease involving the left carotid bifurcation, left V4 vertebral artery, and bilateral cavernous carotid arteries. Electronically Signed   By: Feliberto Harts MD   On: 05/28/2020 16:16   ECHOCARDIOGRAM COMPLETE  Result Date: 05/29/2020    ECHOCARDIOGRAM REPORT   Patient Name:   Dalton Porter Date of Exam: 05/29/2020 Medical Rec #:  [id number]      Height:       71.0 in Accession #:    [id number]     Weight:       240.0 lb Date of Birth:  [birth year]      BSA:          2.278 m Patient Age:    64 years       BP:           133/77 mmHg Patient Gender: M              HR:           98 bpm. [facility] Procedure: 2D Echo and Intracardiac Opacification Agent Indications:    Stroke 434.91 / I163.9  History:        Patient has prior history of Echocardiogram examinations, most                 recent 04/08/2019. Defibrillator, Stroke, Signs/Symptoms:Chest                 Pain; Risk Factors:Non-Smoker, Diabetes and Hypertension. NICM.  Sonographer:    Jeryl Columbia RDCS (AE) Referring Phys: [id number] [facility] IMPRESSIONS  1. Left ventricular ejection fraction, by estimation, is 40 to 45%. The left ventricle has mildly decreased function. The left ventricle demonstrates global hypokinesis. There is moderate  left ventricular hypertrophy. Left ventricular diastolic parameters are indeterminate.  2. Right ventricular systolic function is normal. The right ventricular size is normal. A device wire is visualized.  3. The mitral valve is grossly normal. Trivial mitral valve regurgitation.  4. The aortic valve is tricuspid. Aortic valve regurgitation is not visualized.  5. The inferior vena cava is normal in size with greater than 50% respiratory variability, suggesting right atrial pressure of 3 mmHg. FINDINGS  Left Ventricle: Left ventricular ejection fraction, by estimation, is 40 to 45%. The left ventricle has mildly decreased function. The left ventricle demonstrates global hypokinesis. Definity contrast agent  was given IV to delineate the left ventricular  endocardial borders. The left ventricular internal cavity size was normal in size. There is moderate left ventricular hypertrophy. Left ventricular diastolic parameters are indeterminate. Right Ventricle: The right ventricular size is normal. No increase in right ventricular wall thickness. Right ventricular systolic function is normal. Left Atrium: Left atrial size was normal in size. Right Atrium: Right atrial size was normal in size. Pericardium: There is no evidence of pericardial effusion. Presence of pericardial fat pad. Mitral Valve: The mitral valve is grossly normal. Trivial mitral valve regurgitation. Tricuspid Valve: The tricuspid valve is grossly normal. Tricuspid valve regurgitation is trivial. Aortic Valve: The aortic valve is tricuspid. There is mild aortic valve annular calcification. Aortic valve regurgitation is not visualized. Pulmonic Valve: The pulmonic valve was grossly normal. Pulmonic valve regurgitation is trivial. Aorta: The aortic root is normal in size and structure. Venous: The inferior vena cava is normal in size with greater than 50% respiratory variability, suggesting right atrial pressure of 3 mmHg. IAS/Shunts: No atrial level shunt  detected by color flow Doppler. Additional Comments: A device wire is visualized.  LEFT VENTRICLE PLAX 2D LVIDd:         4.53 cm  Diastology LVIDs:         3.78 cm  LV e' medial:    4.79 cm/s LV PW:         1.30 cm  LV E/e' medial:  8.3 LV IVS:        1.40 cm  LV e' lateral:   8.16 cm/s LVOT diam:     2.10 cm  LV E/e' lateral: 4.9 LVOT Area:     3.46 cm  RIGHT VENTRICLE RV S prime:     15.00 cm/s TAPSE (M-mode): 1.5 cm LEFT ATRIUM           Index       RIGHT ATRIUM           Index LA diam:      4.20 cm 1.84 cm/m  RA Area:     13.10 cm LA Vol (A2C): 32.0 ml 14.04 ml/m RA Volume:   30.20 ml  13.25 ml/m LA Vol (A4C): 52.3 ml 22.95 ml/m   AORTA Ao Root diam: 2.80 cm MITRAL VALVE MV Area (PHT): 3.68 cm    SHUNTS MV Decel Time: 206 msec    Systemic Diam: 2.10 cm MV E velocity: 39.80 cm/s MV A velocity: 67.70 cm/s MV E/A ratio:  0.59 Nona Dell MD Electronically signed by Nona Dell MD Signature Date/Time: 05/29/2020/5:32:14 PM    Final         Scheduled Meds: .  stroke: mapping our early stages of recovery book   Does not apply Once  . aspirin  325 mg Oral Daily  . atorvastatin  20 mg Oral Daily  . enoxaparin (LOVENOX) injection  40 mg Subcutaneous Q24H  . gabapentin  300 mg Oral TID  . insulin aspart  0-5 Units Subcutaneous QHS  . insulin aspart  0-9 Units Subcutaneous TID WC  . metoprolol succinate  25 mg Oral BID  . sacubitril-valsartan  1 tablet Oral BID  . spironolactone  25 mg Oral Daily  . traZODone  100 mg Oral QHS  . vitamin B-12  500 mcg Oral Daily   Continuous Infusions:   LOS: 1 day        Kathlen Mody, MD [facility]   To contact the attending provider between 7A-7P or the covering provider during after  hours 7P-7A, please log into the web site [email or web address] and access using universal [place] password for that web site. If you do not have the password, please call the hospital operator.  05/30/2020, 12:31 PM

## 2020-05-30 NOTE — Consult Note (Signed)
Neurology Consultation Reason for Consult: left sided weakness and word finding issues Requesting Physician: Eleonore Chiquito  CC: Left sided weakness, right sided headache  History is obtained from: Patient and chart review   HPI: Dalton Porter is a 64 y.o. right hand male with a past medical history significant for nonischemic cardiomyopathy of unclear etiology complicated by congestive heart failure, diabetes, and chronic back pain.  He reports that he has been having a right-sided headache for the past 6 weeks which she feels is stable in intensity/frequency.  He describes it as 1 to 2 minutes of stabbing pain followed by a dull pressure sensation that can last several days and is essentially now unremitting.  The stabbing pain happens perhaps twice a week.  His symptoms are worsened with activity and better with laying down he also endorses some nausea, but denies photophobia or phonophobia as well as denying pulsatile tinnitus.  With the use of Excedrin his headache improved from a 7 to 3 out of 10.  He additionally feels he has had trouble speaking mostly due to difficulty concentrating and thinking of the exact words he wants to say.  He reports he only sleeps 4 to 5 hours per night but that this is chronic.  He also has been having chest pain for the past 2 to 3 years and complains of worsening leg cramping that has not responded to initiation of gabapentin.  On review of systems he additionally notes that he did have fatigue while chewing steak for Thanksgiving dinner.  However he denies any episodes of vision loss or any significant proximal muscle weakness.  He denies any bowel or bladder issues  Due to feeling like he might fall due to his left sided weakness, he presented to the ED for further evaluation  ROS: A 14 point ROS was performed and is negative except as noted in the HPI.   Past Medical History:  Diagnosis Date  . Back pain, chronic   . CHF (congestive heart failure) ([place])   .  Diabetes mellitus   . Nonischemic cardiomyopathy ([place])    a. EF 40% by echo in 2003 b. 03/2016: cath showing normal cors and EF of 25-35%, EF 40-45% by echo with diffuse hypokinesis.  . Stroke [facility])    Past Surgical History:  Procedure Laterality Date  . ANKLE SURGERY    . APPENDECTOMY    . BACK SURGERY     x3  . CARDIAC CATHETERIZATION  2002  . CARDIAC CATHETERIZATION N/A 04/08/2016   Procedure: Left Heart Cath and Coronary Angiography;  Surgeon: Peter M Martinique, MD;  Location: [place] [facility];  Service: Cardiovascular;  Laterality: N/A;  . CHOLECYSTECTOMY    . ELBOW SURGERY    . ICD IMPLANT  01/26/2018   SJM Fortify Assura VR ICD implanted by Dr [name] for primary prevention of sudden death  . THUMB FUSION      Current Outpatient Medications  Medication Instructions  . aspirin 325 mg, Oral, Daily  . atorvastatin (LIPITOR) 20 mg, Oral, Daily  . Cholecalciferol (VITAMIN D3) 5000 units CAPS 1 capsule, Oral, Daily  . furosemide (LASIX) 40 mg, Oral, Daily  . gabapentin (NEURONTIN) 300 mg, Oral, 3 times daily  . glipiZIDE (GLUCOTROL) 5 mg, 2 times daily before meals  . metFORMIN (GLUCOPHAGE) 500 mg, Oral, 2 times daily with meals  . metoprolol succinate (TOPROL XL) 25 mg, Oral, 2 times daily  . nitroGLYCERIN (NITROSTAT) 0.4 mg, Sublingual, Every 5 min PRN  . sacubitril-valsartan (  ENTRESTO) 24-26 MG 1 tablet, Oral, 2 times daily  . spironolactone (ALDACTONE) 25 mg, Oral, Daily, [facility] and pt reports on 11/30/2018 that he takes daily.   . TRAZODONE HCL PO 100 mg, Oral, Daily at bedtime     Family History  Problem Relation Age of Onset  . Stroke Other   . Pancreatic cancer Father   . Healthy Sister   . Other Brother        died in a fire  . Leukemia Maternal Grandmother   . Healthy Sister    Social History:  reports that he has never smoked. He has never used smokeless tobacco. He reports that he does not drink alcohol and does not use drugs.   Exam: Current  vital signs: BP 103/70 (BP Location: Left Arm)   Pulse 90   Temp 98 F (36.7 C) (Oral)   Resp 15   Ht _0  (1.803 m)   Wt 108.9 kg   SpO2 100%   BMI 33.47 kg/m  Vital signs in last 24 hours: Temp:  [98 F (36.7 C)-98.9 F (37.2 C)] 98 F (36.7 C) (11/30 2348) Pulse Rate:  [58-98] 90 (11/30 2348) Resp:  [10-27] 15 (12/01 0107) BP: (85-133)/(43-77) 103/70 (12/01 0107) SpO2:  [92 %-100 %] 100 % (11/30 2348)   Physical Exam  Constitutional: Has a distinctly cushingoid body habitus with central obesity and distal muscle atrophy Psych: Affect appropriate to situation Eyes: No scleral injection HENT: No OP obstruction MSK: no joint deformities.  Cardiovascular: Normal rate and regular rhythm.  Respiratory: Effort normal, non-labored breathing GI: Soft.  No distension. There is no tenderness.  Skin: WDI  Neuro: Mental Status: Patient is awake, alert, oriented to person, place, month, year, and situation. Patient is able to give a clear and coherent history. No signs of aphasia or neglect Cranial Nerves: II: Visual Fields are full. Pupils are equal, round, and reactive to light.   III,IV, VI: EOMI without ptosis or diploplia.  V: Facial sensation is symmetric to temperature VII: Facial movement is symmetric.  VIII: hearing is intact to voice X: Uvula elevates symmetrically XI: Shoulder shrug is symmetric. XII: tongue is midline without atrophy or fasciculations.  Motor: Tone is normal. Bulk is normal. 5/5 strength was present in all four extremities.  Notably he does have significant giveaway weakness on the left side, giving way on my initial full strength stimulus.  However when I retest him more gently, he resists, and continues to resist when I increase the force further Sensory:  Temperature testing showed a length dependent process while pinprick was not reduced distally on the left.  Proprioception was reduced bilaterally, but vibration was reduced on the right up to  the knee.  Pinprick was symmetric left to right, but temperature was reduced on the left compared to the right Deep Tendon Reflexes: Right biceps 2+, could not elicit left biceps.  Could not elicit bilateral patellar's.   Plantars: Toes were mute bilaterally Cerebellar: Finger-nose testing is intact bilaterally, but heel-to-shin performance is worse with the left    I have reviewed labs in epic and the results pertinent to this consultation are: Cr 1.05 in 2017, now 1.2-1.4 LDL 17 A1c 7.3%  I have reviewed the images obtained:  HCT -- no acute process CTA -- mild L carotid athero, scattered mild intracranial athero  IMPRESSIONS  1. Left ventricular ejection fraction, by estimation, is 40 to 45%. The  left ventricle has mildly decreased function. The left ventricle  demonstrates global hypokinesis. There is moderate left ventricular  hypertrophy. Left ventricular diastolic  parameters are indeterminate.  2. Right ventricular systolic function is normal. The right ventricular  size is normal. A device wire is visualized.  3. The mitral valve is grossly normal. Trivial mitral valve  regurgitation.  4. The aortic valve is tricuspid. Aortic valve regurgitation is not  visualized.  5. The inferior vena cava is normal in size with greater than 50%  respiratory variability, suggesting right atrial pressure of 3 mmHg.   Impression: This is a 64 year old gentleman with a past medical history significant for diabetes, nonischemic cardiomyopathy, presenting with headache and subjective left-sided weakness/tremulousness.  Examination is notable for giveaway weakness on the left side, with 5/5 strength if the patient is initially given a lighter force to resist.  Additionally the sensory exam is a bit inconsistent, not respecting typical distributions for small fiber neuropathy for example.  There may be a multifactorial process at play such as diabetes in combination with a myelopathy.   Alternatively he may have a process such as amyloidosis contributing to his cardiac issues as well as neuropathy.  Will obtain basic neuropathy work-up to assess for other etiologies in addition to the patient's known diabetes.  Recommendations:  # Left sided weakness, headache  - MRI brain w/ and w/o  - MRI c-spine w/ and w/o  - CK   Neuropathy - SPEP/UPEP/IFe  - B12  - ANA, Ro, La, - Outpatient EMG / NCS  Jaw claudication - ESR - CRP   Lesleigh Noe MD-PhD [facility] [phone]  Addendum:  B12 is borderline at 398, CRP and ESR are mildly elevated and overall the picture is not highly concerning for GCA, CK is normal - Recommend empiric oral supplementation with B12 (ordered) - If MRI brain and C-spine did not show any acute processes, recommend further follow-up on an outpatient basis.

## 2020-05-30 NOTE — Evaluation (Addendum)
Occupational Therapy Evaluation Patient Details Name: Dalton Porter MRN: [id number] DOB: [birth year] Today's Date: 05/30/2020    History of Present Illness Pt is a 64 yo male admitted with impaired balance with recent falls, impaired sensation, increased confusion, headache, L side weakness, and difficulty finding words. CT of head was negative for any acute abnormalities. Awaiting MRI results at this time. PMH: CVA DM2 cardiomyopahty ACID CHF chronic back pain back surg x3. NIHSS = 3.   Clinical Impression   PT admitted with L side weakness. Pt currently with functional limitiations due to the deficits listed below (see OT problem list). Pt currently with balance deficits affecting all adls. Pt requires RW and normally is independent for basic transfers. Pt is able to dress in seated position with figure 4 cross. Next session to work on balance challenges for adls. Fine motor HEP introduced this session and will be carried over to next session. Question benefit of weight utensils. Pt will benefit from skilled OT to increase their independence and safety with adls and balance to allow discharge outpatient neuro follow up recommended.     Follow Up Recommendations  Outpatient OT (neuro outpatient)    Equipment Recommendations  None recommended by OT    Recommendations for Other Services       Precautions / Restrictions Precautions Precautions: Fall Precaution Comments: no falls but reports near fall and fear of falling      Mobility Bed Mobility Overal bed mobility: Modified Independent                  Transfers Overall transfer level: Needs assistance   Transfers: Sit to/from Stand Sit to Stand: Supervision         General transfer comment: provided RW to help with stability with task    Balance Overall balance assessment: Needs assistance         Standing balance support: Single extremity supported;During functional activity Standing balance-Leahy Scale:  Fair                             ADL either performed or assessed with clinical judgement   ADL Overall ADL's : Needs assistance/impaired Eating/Feeding: Minimal assistance Eating/Feeding Details (indicate cue type and reason): reports shaking food off utensils  Grooming: Wash/dry hands;Min guard;Standing               Lower Body Dressing: Min guard;Sit to/from stand Lower Body Dressing Details (indicate cue type and reason): able to figure 4 cross to don bil socks Toilet Transfer: Min guard;Ambulation;RW           Functional mobility during ADLs: Min guard;Rolling walker General ADL Comments: pt reports sitting down for any vision occlusion task such as showering      Vision Baseline Vision/History: Wears glasses Wears Glasses: At all times Patient Visual Report:  (has in contacts) Additional Comments: pt reports having floaters in the last 6 weeks that started with HA     Perception     Praxis      Pertinent Vitals/Pain Pain Assessment: No/denies pain     Hand Dominance Right   Extremity/Trunk Assessment Upper Extremity Assessment Upper Extremity Assessment: RUE deficits/detail;LUE deficits/detail RUE Deficits / Details: noted to have tremor that he reports started within last 10 days RUE Coordination: decreased fine motor;decreased gross motor LUE Deficits / Details: noted to have tremor that he reports started within last 10 days LUE Coordination: decreased fine motor;decreased gross motor  Lower Extremity Assessment Lower Extremity Assessment: Defer to PT evaluation   Cervical / Trunk Assessment Cervical / Trunk Assessment: Kyphotic   Communication Communication Communication: No difficulties   Cognition Arousal/Alertness: Awake/alert Behavior During Therapy: WFL for tasks assessed/performed Overall Cognitive Status: Within Functional Limits for tasks assessed                                     General Comments  pt  is able to reach outside base of support ~5 inches with weight shift stepping    Exercises     Shoulder Instructions      Home Living Family/patient expects to be discharged to:: Private residence Living Arrangements: Spouse/significant other Available Help at Discharge: Family;Available 24 hours/day Type of Home: House Home Access: Stairs to enter [employer] of Steps: 3 Entrance Stairs-Rails: None Home Layout: One level     Bathroom Shower/Tub: Producer, television/film/video: Handicapped height     Home Equipment: Environmental consultant - 2 wheels;Walker - 4 wheels   Additional Comments: has a 22 yo dog named Pepper  Lives With: Spouse    Prior Functioning/Environment Level of Independence: Independent;Needs assistance  Gait / Transfers Assistance Needed: reports needing wife (A) recently due to balance changes ADL's / Homemaking Assistance Needed: wife reports "watching him like a hawk" and setting up the house to decr fall risk by bringing things to him            OT Problem List: Decreased activity tolerance;Impaired balance (sitting and/or standing);Decreased knowledge of use of DME or AE      OT Treatment/Interventions: Self-care/ADL training;Therapeutic exercise;DME and/or AE instruction;Energy conservation;Manual therapy;Therapeutic activities;Patient/family education;Balance training    OT Goals(Current goals can be found in the care plan section) Acute Rehab OT Goals Patient Stated Goal: to not fall  OT Goal Formulation: With patient Time For Goal Achievement: 06/13/20 Potential to Achieve Goals: Good  OT Frequency: Min 2X/week   Barriers to D/C:            Co-evaluation              AM-PAC OT "6 Clicks" Daily Activity     Outcome Measure Help from another person eating meals?: A Little Help from another person taking care of personal grooming?: A Little Help from another person toileting, which includes using toliet, bedpan, or urinal?: A  Little Help from another person bathing (including washing, rinsing, drying)?: A Little Help from another person to put on and taking off regular upper body clothing?: A Little Help from another person to put on and taking off regular lower body clothing?: A Little 6 Click Score: 18   End of Session Equipment Utilized During Treatment: Rolling walker;Gait belt Nurse Communication: Mobility status;Precautions  Activity Tolerance: Patient tolerated treatment well Patient left: Other (comment) (with PT )  OT Visit Diagnosis: Unsteadiness on feet (R26.81);Muscle weakness (generalized) (M62.81)                Time: 1310-1336 OT Time Calculation (min): 26 min Charges:  OT General Charges $OT Visit: 1 Visit OT Evaluation $OT Eval Moderate Complexity: 1 Mod   Brynn, OTR/L  Acute Rehabilitation Services Pager: [phone] Office: [phone] .   Mateo Flow 05/30/2020, 2:10 PM

## 2020-05-30 NOTE — Evaluation (Signed)
Speech Language Pathology Evaluation Patient Details Name: Dalton Porter MRN: [id number] DOB: 1955/09/04 Today's Date: 05/30/2020 Time: 7517-0017 SLP Time Calculation (min) (ACUTE ONLY): 19 min  Problem List:  Patient Active Problem List   Diagnosis Date Noted  . Left-sided weakness 05/28/2020  . History of stroke 01/19/2019  . Nonischemic cardiomyopathy ([facility]) 04/09/2016  . 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 Chest pain on exertion 01/15/2011  . HTN (hypertension) 01/15/2011   Past Medical History:  Past Medical History:  Diagnosis Date  . Back pain, chronic   . CHF (congestive heart failure) ([facility])   . Diabetes mellitus   . Nonischemic cardiomyopathy ([facility])    a. EF 40% by echo in 2003 b. 03/2016: cath showing normal cors and EF of 25-35%, EF 40-45% by echo with diffuse hypokinesis.  . Stroke [facility])    Past Surgical History:  Past Surgical History:  Procedure Laterality Date  . ANKLE SURGERY    . APPENDECTOMY    . BACK SURGERY     x3  . CARDIAC CATHETERIZATION  2002  . CARDIAC CATHETERIZATION N/A 04/08/2016   Procedure: Left Heart Cath and Coronary Angiography;  Surgeon: Peter M [place], MD;  Location: [facility] INVASIVE CV LAB;  Service: Cardiovascular;  Laterality: N/A;  . CHOLECYSTECTOMY    . ELBOW SURGERY    . ICD IMPLANT  01/26/2018   SJM Fortify Assura VR ICD implanted by Dr Marily Memos for primary prevention of sudden death  . THUMB FUSION     HPI:  Dalton Porter is a 64 y.o. male with medical history significant for stroke, diabetes mellitus, cardiomyopathy. Patient presented to the ED with complaints of intermittent headaches more on the right side of his head, ongoing for about 6 weeks.  Reports some improvement in intensity of headaches with Tylenol and Excedrin.  Reports he used to have headaches about 20 to 30 years ago. Reports over the past week he started having left-sided -upper and lower extremity weakness, and difficulty  finding his words.  Patient reports about a year ago he had a stroke, weakness involve mostly his lower extremity as he was unable to walk.  Reports symptoms were not as severe as this. With this new weakness, he is able to ambulate only with assistance of his wife otherwise he cannot ambulate.   Assessment / Plan / Recommendation Clinical Impression  Pts cognitive abilities appear grossly within functional limits . Pt reports acute word finding difficulty leading up to admission and currently. None exhibited during interaction with SLP. Pt fluent with communication and naming tasks. Minimal deficits exhibited in delayed recall 4/5 and mental manipulation. Pt states he feels that cognitive abilities are at baseline functioning. Motor speech skills were intact. PLOF pt lives with spouse and was independent with all ADLs. Further neurological imaging workup to be completed. SLP to follow up to ensure resolution with acute anomia.     SLP Assessment  SLP Recommendation/Assessment: Patient needs continued Speech Lanaguage Pathology Services SLP Visit Diagnosis: Aphasia (R47.01)    Follow Up Recommendations       Frequency and Duration min 1 x/week  1 week      SLP Evaluation Cognition  Overall Cognitive Status: No family/caregiver present to determine baseline cognitive functioning Arousal/Alertness: Awake/alert Orientation Level: Oriented X4 Attention: Focused;Sustained Focused Attention: Appears intact Sustained Attention: Appears intact Memory: Appears intact Awareness: Appears intact Problem Solving: Appears intact Executive Function: Organizing;Self Monitoring Organizing: Appears intact Self Monitoring: Appears intact Safety/Judgment: Appears intact  Comprehension  Auditory Comprehension Overall Auditory Comprehension: Appears within functional limits for tasks assessed Reading Comprehension Reading Status: Within funtional limits    Expression Expression Primary Mode  of Expression: Verbal Verbal Expression Overall Verbal Expression: Other (comment) (reports acute anomia of speech) Initiation: No impairment Repetition: No impairment Naming: No impairment Written Expression Dominant Hand: Right   Oral / Motor  Oral Motor/Sensory Function Overall Oral Motor/Sensory Function: Within functional limits Motor Speech Overall Motor Speech: Appears within functional limits for tasks assessed   GO                    Remiel Corti E Anushri Casalino MA, CCC-SLP Acute Rehabilitation Services  05/30/2020, 9:59 AM

## 2020-05-31 DIAGNOSIS — E1149 Type 2 diabetes mellitus with other diabetic neurological complication: Secondary | ICD-10-CM | POA: Diagnosis not present

## 2020-05-31 DIAGNOSIS — R531 Weakness: Secondary | ICD-10-CM

## 2020-05-31 DIAGNOSIS — R270 Ataxia, unspecified: Secondary | ICD-10-CM | POA: Diagnosis not present

## 2020-05-31 LAB — SJOGRENS SYNDROME-A EXTRACTABLE NUCLEAR ANTIBODY: SSA (Ro) (ENA) Antibody, IgG: <0.2 AI (ref 0.0–0.9)

## 2020-05-31 LAB — SJOGRENS SYNDROME-B EXTRACTABLE NUCLEAR ANTIBODY: SSB (La) (ENA) Antibody, IgG: <0.2 AI (ref 0.0–0.9)

## 2020-05-31 LAB — IMMUNOFIXATION ELECTROPHORESIS
IgA: 161 mg/dL (ref 61–437)
IgG (Immunoglobin G), Serum: 537 mg/dL — ABNORMAL LOW (ref 603–1613)
IgM (Immunoglobulin M), Srm: 48 mg/dL (ref 20–172)
Total Protein ELP: 6.3 g/dL (ref 6.0–8.5)

## 2020-05-31 LAB — GLUCOSE, CAPILLARY
Glucose-Capillary: 170 mg/dL — ABNORMAL HIGH (ref 70–99)
Glucose-Capillary: 184 mg/dL — ABNORMAL HIGH (ref 70–99)
Glucose-Capillary: 222 mg/dL — ABNORMAL HIGH (ref 70–99)

## 2020-05-31 LAB — ANA W/REFLEX IF POSITIVE: Anti Nuclear Antibody (ANA): NEGATIVE

## 2020-05-31 MED ORDER — CYANOCOBALAMIN 500 MCG PO TABS: 500.0000 ug | ORAL_TABLET | Freq: Every day | ORAL | 1 refills | Status: AC

## 2020-05-31 NOTE — Discharge Summary (Signed)
Physician Discharge Summary  Dalton Porter AJO:[id number] DOB: [birth year] DOA: 05/28/2020  PCP: [facility] [place] date: 05/28/2020 Discharge date: 05/31/2020  Admitted From: Home Disposition: hOME  Recommendations for Outpatient Follow-up:  1. Follow up with PCP in 1-2 weeks 2. Please obtain BMP/CBC in one week Please follow up with neurology outpatient  Please follow up with outpatient PT/OT.    Discharge Condition: STABLE.   CODE STATUS: FULL CODE.  Diet recommendation: Heart Healthy / Carb Modified   Brief/Interim Summary: 64 year old gentleman prior history of type 2 diabetes, s/p AICD for ischemic cardiomyopathy, stroke, cardiomyopathy presents to ED with complaints of intermittent headache going on for the last 6 weeks.  Patient also reported some left-sided upper and lower extremity weakness and difficulty finding words for about 1 week.  He was admitted for evaluation of stroke. Patient needed to be transferred to [facility] and AICD needs to be switched off for the MRI to be done.  MRI Of the Brain with and without contrast.  is negative for acute intracranial abnormality. Mild white matter changes consistent with chronic microvascular ischemia.  MRI of the cervical spine shows Mild cervical spine degenerative change with mild foraminal narrowing bilaterally.   Discharge Diagnoses:  Principal Problem:   Left-sided weakness Active Problems:   Diabetes mellitus type 2 with neurological manifestations (HCC)   HTN (hypertension)   Nonischemic cardiomyopathy (HCC)   Left sided weakness, difficulty finding words, mild headaches for up to 6 weeks,  CT of the head and neck showed no acute abnormalities.  Neurology recommended transfer the patient to [facility]  MRI of the brain without contrast AND  MRI of the cervical spine without and with contrast is unremarkable.  Echocardiogram showed left ventricular ejection fraction of 40 to 45% with global hypokinesis and  indeterminate left ventricular diastolic parameters. Elevated CRP ESR, mildy elevated, over all picture is not highly concerning for GCA.  CK level is within normal limits. Neurology recommends if the MRI of the brain and C-spine does not show any acute process recommend outpatient follow-up.    Peripheral neuropathy SPEP, UPEP, vitamin B12 levels.  ANA, anti-Ro anti-La as per neurology.  Outpatient follow-up with EMG/NCS Continue with gabapentin 300 mg 3 times daily.   Essential hypertension Continue with Entresto, spironolactone and metoprolol.     History of ischemic cardiomyopathy s/p AICD. Echocardiogram this admission showed decrease in left ventricular ejection fraction from 50% to 45% with global hypokinesis. Recommend outpatient follow-up with Dr. Rayann Heman. Continue with aspirin 325 mg, Lipitor 20 mg, metoprolol 25 mg twice daily, Entresto 24-26 mg twice daily Spironolactone 25 mg daily.   Type 2 diabetes mellitus Last A1c is 7.3 Continue with home meds on discharge.   Discharge Instructions  Discharge Instructions    Diet - low sodium heart healthy   Complete by: As directed    Discharge instructions   Complete by: As directed    Please follow up with PCP in one week.  Please follow up with neurology in the office in 1 to 2 weeks.   Discharge wound care:   Complete by: As directed    Cover right toe with band aid and change as needed.     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   atorvastatin 20 MG tablet Commonly known as: LIPITOR Take 1 tablet (20 mg total) by mouth daily.   Entresto 24-26 MG Generic drug: sacubitril-valsartan Take 1 tablet by mouth 2 (two) times daily.    furosemide 20 MG tablet Commonly known as: LASIX Take 2 tablets (40 mg total) by mouth daily.   gabapentin 300 MG capsule Commonly known as: NEURONTIN Take 300 mg by mouth 3 (three) times daily.   glipiZIDE 5 MG tablet Commonly known as: GLUCOTROL Take 5 mg by mouth 2 (two) times daily before a meal.   metFORMIN 500 MG tablet Commonly known as: GLUCOPHAGE Take 500 mg by mouth 2 (two) times daily with a meal.   metoprolol succinate 25 MG 24 hr tablet Commonly known as: Toprol XL Take 1 tablet (25 mg total) by mouth 2 (two) times daily.   nitroGLYCERIN 0.4 MG SL tablet Commonly known as: NITROSTAT Place 1 tablet (0.4 mg total) under the tongue every 5 (five) minutes as needed for chest pain.   spironolactone 25 MG tablet Commonly known as: ALDACTONE Take 25 mg by mouth daily. Prescribed by [place] and pt reports on 11/30/2018 that he takes daily.   TRAZODONE HCL PO Take 100 mg by mouth at bedtime.   vitamin B-12 500 MCG tablet Commonly known as: CYANOCOBALAMIN Take 1 tablet (500 mcg total) by mouth daily. Start taking on: June 01, 2020   Vitamin D3 125 MCG (5000 UT) Caps Take 1 capsule by mouth daily.            Discharge Care Instructions  (From admission, onward)         Start     Ordered   05/31/20 0000  Discharge wound care:       Comments: Cover right toe with band aid and change as needed.   05/31/20 1036          Allergies  Allergen Reactions  . 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 Codeine Rash    headache    Consultations: Neurology.   Procedures/Studies: CT Angio Head W or Wo Contrast  Result Date: 05/28/2020 CLINICAL DATA:  Dizziness.  Headache. EXAM: CT ANGIOGRAPHY HEAD AND NECK TECHNIQUE: Multidetector CT imaging of the head and neck was performed using the standard protocol during bolus administration of intravenous contrast. Multiplanar  CT image reconstructions and MIPs were obtained to evaluate the vascular anatomy. Carotid stenosis measurements (when applicable) are obtained utilizing NASCET criteria, using the distal internal carotid diameter as the denominator. CONTRAST:  173m OMNIPAQUE IOHEXOL 350 MG/ML SOLN COMPARISON:  CTA Aug 27, 2018 FINDINGS: CT HEAD FINDINGS Brain: No evidence of acute large vascular territory infarction, hemorrhage, hydrocephalus, extra-axial collection or mass lesion/mass effect. Skull: No acute fracture. Sinuses: No acute finding. Orbits: No acute finding. Review of the MIP images confirms the above findings CTA NECK FINDINGS Aortic arch: Imaged portion shows no evidence of aneurysm or dissection. No significant stenosis of the major arch vessel origins. Right carotid system: No evidence of dissection, stenosis (50% or greater) or occlusion. Left carotid system: No evidence of dissection, stenosis (50% or greater) or occlusion. Mixed atherosclerosis at the bifurcation. Vertebral arteries: Codominant. No evidence of dissection, stenosis (50% or greater) or occlusion. Calcific atherosclerosis of the intradural left  vertebral artery with mild associated narrowing. Skeleton: No evidence of acute fracture. Other neck: No mass or suspicious adenopathy. Upper chest: Negative. Review of the MIP images confirms the above findings CTA HEAD FINDINGS Anterior circulation: No significant stenosis, proximal occlusion, aneurysm, or vascular malformation. Calcific atherosclerosis of bilateral cavernous carotid arteries. Posterior circulation: No significant stenosis, proximal occlusion, aneurysm, or vascular malformation. Venous sinuses: As permitted by contrast timing, patent. Review of the MIP images confirms the above findings IMPRESSION: 1. No evidence of acute intracranial abnormality. 2. No evidence of acute dissection, hemodynamically significant proximal stenosis, or large vessel occlusion in the head or neck. 3. Mild  atherosclerotic disease involving the left carotid bifurcation, left V4 vertebral artery, and bilateral cavernous carotid arteries. Electronically Signed   By: Margaretha Sheffield MD   On: 05/28/2020 16:16   CT Angio Neck W and/or Wo Contrast  Result Date: 05/28/2020 CLINICAL DATA:  Dizziness.  Headache. EXAM: CT ANGIOGRAPHY HEAD AND NECK TECHNIQUE: Multidetector CT imaging of the head and neck was performed using the standard protocol during bolus administration of intravenous contrast. Multiplanar CT image reconstructions and MIPs were obtained to evaluate the vascular anatomy. Carotid stenosis measurements (when applicable) are obtained utilizing NASCET criteria, using the distal internal carotid diameter as the denominator. CONTRAST:  11m OMNIPAQUE IOHEXOL 350 MG/ML SOLN COMPARISON:  CTA Aug 27, 2018 FINDINGS: CT HEAD FINDINGS Brain: No evidence of acute large vascular territory infarction, hemorrhage, hydrocephalus, extra-axial collection or mass lesion/mass effect. Skull: No acute fracture. Sinuses: No acute finding. Orbits: No acute finding. Review of the MIP images confirms the above findings CTA NECK FINDINGS Aortic arch: Imaged portion shows no evidence of aneurysm or dissection. No significant stenosis of the major arch vessel origins. Right carotid system: No evidence of dissection, stenosis (50% or greater) or occlusion. Left carotid system: No evidence of dissection, stenosis (50% or greater) or occlusion. Mixed atherosclerosis at the bifurcation. Vertebral arteries: Codominant. No evidence of dissection, stenosis (50% or greater) or occlusion. Calcific atherosclerosis of the intradural left vertebral artery with mild associated narrowing. Skeleton: No evidence of acute fracture. Other neck: No mass or suspicious adenopathy. Upper chest: Negative. Review of the MIP images confirms the above findings CTA HEAD FINDINGS Anterior circulation: No significant stenosis, proximal occlusion, aneurysm, or  vascular malformation. Calcific atherosclerosis of bilateral cavernous carotid arteries. Posterior circulation: No significant stenosis, proximal occlusion, aneurysm, or vascular malformation. Venous sinuses: As permitted by contrast timing, patent. Review of the MIP images confirms the above findings IMPRESSION: 1. No evidence of acute intracranial abnormality. 2. No evidence of acute dissection, hemodynamically significant proximal stenosis, or large vessel occlusion in the head or neck. 3. Mild atherosclerotic disease involving the left carotid bifurcation, left V4 vertebral artery, and bilateral cavernous carotid arteries. Electronically Signed   By: FMargaretha SheffieldMD   On: 05/28/2020 16:16   MR BRAIN W WO CONTRAST  Result Date: 05/30/2020 CLINICAL DATA:  Headache EXAM: MRI HEAD WITHOUT AND WITH CONTRAST TECHNIQUE: Multiplanar, multiecho pulse sequences of the brain and surrounding structures were obtained without and with intravenous contrast. CONTRAST:  941mGADAVIST GADOBUTROL 1 MMOL/ML IV SOLN COMPARISON:  CT angio head neck 05/28/2020 FINDINGS: Brain: Ventricle size and cerebral volume normal. Mild white matter changes with few small hyperintensities in the deep white matter in the frontal lobes bilaterally. Negative for acute infarct. Negative for hemorrhage or mass. Normal enhancement. Vascular: Normal arterial flow voids.  Normal venous enhancement. Skull and upper cervical spine: No focal skeletal lesion. Sinuses/Orbits: Mild  mucosal edema paranasal sinuses. Negative orbit Other: None IMPRESSION: No acute intracranial abnormality. Mild white matter changes consistent with chronic microvascular ischemia. Electronically Signed   By: Franchot Gallo M.D.   On: 05/30/2020 13:37   MR CERVICAL SPINE W WO CONTRAST  Result Date: 05/30/2020 CLINICAL DATA:  Spinal stenosis.  Headache EXAM: MRI CERVICAL SPINE WITHOUT AND WITH CONTRAST TECHNIQUE: Multiplanar and multiecho pulse sequences of the cervical  spine, to include the craniocervical junction and cervicothoracic junction, were obtained without and with intravenous contrast. CONTRAST:  65m GADAVIST GADOBUTROL 1 MMOL/ML IV SOLN COMPARISON:  None. FINDINGS: Alignment: Mild retrolisthesis C4-5.  Remaining alignment normal. Vertebrae: Negative for fracture or mass. Cord: Normal signal and morphology. Posterior Fossa, vertebral arteries, paraspinal tissues: Negative Disc levels: C2-3: Negative C3-4: Mild facet degeneration.  Mild foraminal narrowing bilaterally C4-5: Mild uncinate spurring on the left. Mild facet degeneration. Mild left foraminal narrowing. C5-6: Mild disc degeneration and mild facet degeneration. No significant stenosis. C6-7: Mild disc degeneration and spurring. Mild left foraminal stenosis due to spurring. C7-T1: Negative IMPRESSION: Mild cervical spine degenerative change with mild foraminal narrowing bilaterally as above. No acute abnormality. Electronically Signed   By: CFranchot GalloM.D.   On: 05/30/2020 13:42   MR MRV HEAD W WO CONTRAST  Result Date: 05/30/2020 CLINICAL DATA:  Headache EXAM: MR VENOGRAM HEAD WITHOUT AND WITH CONTRAST TECHNIQUE: Angiographic images of the intracranial venous structures were obtained using MRV technique without and with intravenous contrast. CONTRAST:  951mGADAVIST GADOBUTROL 1 MMOL/ML IV SOLN COMPARISON:  CT angio head neck 05/28/2020 FINDINGS: Major dural sinuses widely patent without significant stenosis. Mild irregularity of the superior sagittal sinus most likely due to arachnoid granulations which are hyperintense on T2. Right transverse sinus dominant. IMPRESSION: No evidence of dural sinus thrombosis or occlusion. Electronically Signed   By: ChFranchot Gallo.D.   On: 05/30/2020 13:39   ECHOCARDIOGRAM COMPLETE  Result Date: 05/29/2020    ECHOCARDIOGRAM REPORT   Patient Name:   HOORBY TANGENate of Exam: 05/29/2020 Medical Rec #:  [id number]    Height:       71.0 in Accession #:     [id number]   Weight:       240.0 lb Date of Birth:  [birth year]    BSA:          2.278 m Patient Age:    [age]       BP:           133/77 mmHg Patient Gender: M              HR:           98 bpm. Exam [facility]:  [facility]: 2D Echo and Intracardiac Opacification Agent Indications:    Stroke 434.91 / I163.9  History:        Patient has prior history of Echocardiogram examinations, most                 recent 04/08/2019. Defibrillator, Stroke, Signs/Symptoms:Chest                 Pain; Risk Factors:Non-Smoker, Diabetes and Hypertension. NICM.  Sonographer:    JoLeavy CellaDCS (AE) Referring Phys: [id number]. Left ventricular ejection fraction, by estimation, is 40 to 45%. The left ventricle has mildly decreased function. The left ventricle demonstrates global hypokinesis. There is moderate left ventricular hypertrophy. Left ventricular diastolic parameters are indeterminate.  2. Right ventricular systolic function is normal. The right ventricular size is normal. A device wire is visualized.  3. The mitral valve is grossly normal. Trivial mitral valve regurgitation.  4. The aortic valve is tricuspid. Aortic valve regurgitation is not visualized.  5. The inferior vena cava is normal in size with greater than 50% respiratory variability, suggesting right atrial pressure of 3 mmHg. FINDINGS  Left Ventricle: Left ventricular ejection fraction, by estimation, is 40 to 45%. The left ventricle has mildly decreased function. The left ventricle demonstrates global hypokinesis. Definity contrast agent was given IV to delineate the left ventricular  endocardial borders. The left ventricular internal cavity size was normal in size. There is moderate left ventricular hypertrophy. Left ventricular diastolic parameters are indeterminate. Right Ventricle: The right ventricular size is normal. No increase in right ventricular wall thickness. Right ventricular systolic function is normal.  Left Atrium: Left atrial size was normal in size. Right Atrium: Right atrial size was normal in size. Pericardium: There is no evidence of pericardial effusion. Presence of pericardial fat pad. Mitral Valve: The mitral valve is grossly normal. Trivial mitral valve regurgitation. Tricuspid Valve: The tricuspid valve is grossly normal. Tricuspid valve regurgitation is trivial. Aortic Valve: The aortic valve is tricuspid. There is mild aortic valve annular calcification. Aortic valve regurgitation is not visualized. Pulmonic Valve: The pulmonic valve was grossly normal. Pulmonic valve regurgitation is trivial. Aorta: The aortic root is normal in size and structure. Venous: The inferior vena cava is normal in size with greater than 50% respiratory variability, suggesting right atrial pressure of 3 mmHg. IAS/Shunts: No atrial level shunt detected by color flow Doppler. Additional Comments: A device wire is visualized.  LEFT VENTRICLE PLAX 2D LVIDd:         4.53 cm  Diastology LVIDs:         3.78 cm  LV e' medial:    4.79 cm/s LV PW:         1.30 cm  LV E/e' medial:  8.3 LV IVS:        1.40 cm  LV e' lateral:   8.16 cm/s LVOT diam:     2.10 cm  LV E/e' lateral: 4.9 LVOT Area:     3.46 cm  RIGHT VENTRICLE RV S prime:     15.00 cm/s TAPSE (M-mode): 1.5 cm LEFT ATRIUM           Index       RIGHT ATRIUM           Index LA diam:      4.20 cm 1.84 cm/m  RA Area:     13.10 cm LA Vol (A2C): 32.0 ml 14.04 ml/m RA Volume:   30.20 ml  13.25 ml/m LA Vol (A4C): 52.3 ml 22.95 ml/m   AORTA Ao Root diam: 2.80 cm MITRAL VALVE MV Area (PHT): 3.68 cm    SHUNTS MV Decel Time: 206 msec    Systemic Diam: 2.10 cm MV E velocity: 39.80 cm/s MV A velocity: 67.70 cm/s MV E/A ratio:  0.59 Rozann Lesches MD Electronically signed by Rozann Lesches MD Signature Date/Time: 05/29/2020/5:32:14 PM    Final      Subjective: No new complaints.   Discharge Exam: Vitals:   05/31/20 0348 05/31/20 0712  BP: 112/61 113/60  Pulse: 68 73  Resp:  16 13  Temp: 98.7 F (37.1 C) 97.8 F (36.6 C)  SpO2: 95% 96%   Vitals:   05/31/20 0109 05/31/20 0314 05/31/20 0348 05/31/20 7169  BP: (!) 104/56 94/60 112/61 113/60  Pulse: 70  68 73  Resp:   16 13  Temp:   98.7 F (37.1 C) 97.8 F (36.6 C)  TempSrc:   Oral   SpO2:   95% 96%  Weight:      Height:        General: Pt is alert, awake, not in acute distress Cardiovascular: RRR, S1/S2 +, no rubs, no gallops Respiratory: CTA bilaterally, no wheezing, no rhonchi Abdominal: Soft, NT, ND, bowel sounds + Extremities: no edema, no cyanosis    The results of significant diagnostics from this hospitalization (including imaging, microbiology, ancillary and laboratory) are listed below for reference.     Microbiology: Recent Results (from the past 240 hour(s))  Resp Panel by RT-PCR (Flu A&B, Covid) Nasopharyngeal Swab     Status: None   Collection Time: 05/29/20 12:14 PM   Specimen: Nasopharyngeal Swab; Nasopharyngeal(NP) swabs in vial transport medium  Result Value Ref Range Status   SARS Coronavirus 2 by RT PCR NEGATIVE NEGATIVE Final    Comment: (NOTE) SARS-CoV-2 target nucleic acids are NOT DETECTED.  The SARS-CoV-2 RNA is generally detectable in upper respiratory specimens during the acute phase of infection. The lowest concentration of SARS-CoV-2 viral copies this assay can detect is 138 copies/mL. A negative result does not preclude SARS-Cov-2 infection and should not be used as the sole basis for treatment or other patient management decisions. A negative result may occur with  improper specimen collection/handling, submission of specimen other than nasopharyngeal swab, presence of viral mutation(s) within the areas targeted by this assay, and inadequate number of viral copies(<138 copies/mL). A negative result must be combined with clinical observations, patient history, and epidemiological information. The expected result is Negative.  Fact Sheet for Patients:   EntrepreneurPulse.com.au  Fact Sheet for Healthcare Providers:  IncredibleEmployment.be  This test is no t yet approved or cleared by the [place] FDA and  has been authorized for detection and/or diagnosis of SARS-CoV-2 by FDA under an Emergency Use Authorization (EUA). This EUA will remain  in effect (meaning this test can be used) for the duration of the COVID-19 declaration under Section 564(b)(1) of the Act, 21 U.S.C.section 360bbb-3(b)(1), unless the authorization is terminated  or revoked sooner.       Influenza A by PCR NEGATIVE NEGATIVE Final   Influenza B by PCR NEGATIVE NEGATIVE Final    Comment: (NOTE) The Xpert Xpress SARS-CoV-2/FLU/RSV plus assay is intended as an aid in the diagnosis of influenza from Nasopharyngeal swab specimens and should not be used as a sole basis for treatment. Nasal washings and aspirates are unacceptable for Xpert Xpress SARS-CoV-2/FLU/RSV testing.  Fact Sheet for Patients: EntrepreneurPulse.com.au  Fact Sheet for Healthcare Providers: IncredibleEmployment.be  This test is not yet approved or cleared by the [place] FDA and has been authorized for detection and/or diagnosis of SARS-CoV-2 by FDA under an Emergency Use Authorization (EUA). This EUA will remain in effect (meaning this test can be used) for the duration of the COVID-19 declaration under Section 564(b)(1) of the Act, 21 U.S.C. section 360bbb-3(b)(1), unless the authorization is terminated or revoked.  Performed at [facility], [address], [place], [place] [address]      Labs: BNP (last 3 results) No results for input(s): BNP in the last 8760 hours. Basic Metabolic Panel: Recent Labs  Lab 05/28/20 1420 05/28/20 1423 05/30/20 0222  NA 137 139 136  K 4.8 4.8 4.5  CL 104 105 102  CO2 23  --  24  GLUCOSE 77 78 146*  BUN '21 21 21  ' CREATININE 1.35* 1.40* 1.30*  CALCIUM 8.7*  --   9.3   Liver Function Tests: Recent Labs  Lab 05/28/20 1420  AST 22  ALT 25  ALKPHOS 48  BILITOT 0.5  PROT 7.5  ALBUMIN 3.9   No results for input(s): LIPASE, AMYLASE in the last 168 hours. No results for input(s): AMMONIA in the last 168 hours. CBC: Recent Labs  Lab 05/28/20 1420 05/28/20 1423  WBC 7.9  --   NEUTROABS 5.0  --   HGB 12.1* 11.6*  HCT 37.2* 34.0*  MCV 94.9  --   PLT 237  --    Cardiac Enzymes: Recent Labs  Lab 05/30/20 0521  CKTOTAL 85   BNP: Invalid input(s): POCBNP CBG: Recent Labs  Lab 05/30/20 0926 05/30/20 1844 05/30/20 2124 05/31/20 0618 05/31/20 0738  GLUCAP 277* 275* 175* 170* 184*   D-Dimer No results for input(s): DDIMER in the last 72 hours. Hgb A1c Recent Labs    05/28/20 1420  HGBA1C 7.3*   Lipid Profile Recent Labs    05/29/20 0526  CHOL 84  HDL 27*  LDLCALC 17  TRIG 200*  CHOLHDL 3.1   Thyroid function studies No results for input(s): TSH, T4TOTAL, T3FREE, THYROIDAB in the last 72 hours.  Invalid input(s): FREET3 Anemia work up Recent Labs    05/30/20 0521  VITAMINB12 398   Urinalysis    Component Value Date/Time   COLORURINE YELLOW 05/28/2020 1310   APPEARANCEUR CLEAR 05/28/2020 1310   LABSPEC 1.012 05/28/2020 1310   PHURINE 5.0 05/28/2020 1310   GLUCOSEU NEGATIVE 05/28/2020 1310   HGBUR NEGATIVE 05/28/2020 1310   BILIRUBINUR NEGATIVE 05/28/2020 1310   KETONESUR NEGATIVE 05/28/2020 1310   PROTEINUR NEGATIVE 05/28/2020 1310   NITRITE NEGATIVE 05/28/2020 1310   LEUKOCYTESUR NEGATIVE 05/28/2020 1310   Sepsis Labs Invalid input(s): PROCALCITONIN,  WBC,  LACTICIDVEN Microbiology Recent Results (from the past 240 hour(s))  Resp Panel by RT-PCR (Flu A&B, Covid) Nasopharyngeal Swab     Status: None   Collection Time: 05/29/20 12:14 PM   Specimen: Nasopharyngeal Swab; Nasopharyngeal(NP) swabs in vial transport medium  Result Value Ref Range Status   SARS Coronavirus 2 by RT PCR NEGATIVE NEGATIVE Final     Comment: (NOTE) SARS-CoV-2 target nucleic acids are NOT DETECTED.  The SARS-CoV-2 RNA is generally detectable in upper respiratory specimens during the acute phase of infection. The lowest concentration of SARS-CoV-2 viral copies this assay can detect is 138 copies/mL. A negative result does not preclude SARS-Cov-2 infection and should not be used as the sole basis for treatment or other patient management decisions. A negative result may occur with  improper specimen collection/handling, submission of specimen other than nasopharyngeal swab, presence of viral mutation(s) within the areas targeted by this assay, and inadequate number of viral copies(<138 copies/mL). A negative result must be combined with clinical observations, patient history, and epidemiological information. The expected result is Negative.  Fact Sheet for Patients:  EntrepreneurPulse.com.au  Fact Sheet for Healthcare Providers:  IncredibleEmployment.be  This test is no t yet approved or cleared by the [place] FDA and  has been authorized for detection and/or diagnosis of SARS-CoV-2 by FDA under an Emergency Use Authorization (EUA). This EUA will remain  in effect (meaning this test can be used) for the duration of the COVID-19 declaration under Section 564(b)(1) of the Act, 21 U.S.C.section 360bbb-3(b)(1), unless the authorization is terminated  or revoked sooner.  Influenza A by PCR NEGATIVE NEGATIVE Final   Influenza B by PCR NEGATIVE NEGATIVE Final    Comment: (NOTE) The Xpert Xpress SARS-CoV-2/FLU/RSV plus assay is intended as an aid in the diagnosis of influenza from Nasopharyngeal swab specimens and should not be used as a sole basis for treatment. Nasal washings and aspirates are unacceptable for Xpert Xpress SARS-CoV-2/FLU/RSV testing.  Fact Sheet for Patients: EntrepreneurPulse.com.au  Fact Sheet for Healthcare  Providers: IncredibleEmployment.be  This test is not yet approved or cleared by the [place] FDA and has been authorized for detection and/or diagnosis of SARS-CoV-2 by FDA under an Emergency Use Authorization (EUA). This EUA will remain in effect (meaning this test can be used) for the duration of the COVID-19 declaration under Section 564(b)(1) of the Act, 21 U.S.C. section 360bbb-3(b)(1), unless the authorization is terminated or revoked.  Performed at [facility], [address], [place], [place] [address]      Time coordinating discharge: 31 minutes.  SIGNED:   Hosie Poisson, MD  [facility] 05/31/2020, 10:36 AM

## 2020-05-31 NOTE — TOC Transition Note (Signed)
Transition of Care [facility]) - CM/SW Discharge Note   Patient Details  Name: Dalton Porter MRN: [id number] Date of Birth: 05-12-1956  Transition of Care [facility]) CM/SW Contact:  Pollie Friar, RN Phone Number: 05/31/2020, 1:17 PM   Clinical Narrative:    Pt discharging home with outpatient therapy. CM met with the patient and he asked to attend at [facility] outpatient therapy. Orders in Epic and information on the AVS. Pt has all needed DME at home; rollator, shower seat, walker. Pt has transportation home.    Final next level of care: OP Rehab Barriers to Discharge: No Barriers Identified   Patient Goals and CMS Choice     Choice offered to / list presented to : Patient  Discharge Placement                       Discharge Plan and Services                                     Social Determinants of Health (SDOH) Interventions     Readmission Risk Interventions No flowsheet data found.

## 2020-05-31 NOTE — Progress Notes (Signed)
FOLLOW-UP RECOMMENDATIONS  MRI brain and C-spine reviewed. No acute changes. Outpatient neurology follow up as advised by Dr. Iver Nestle in the intial consult note. Please call w/questions  -- Milon Dikes MD Neurology

## 2020-05-31 NOTE — Progress Notes (Signed)
Pt's IV removed; site is clean, dry and intact. Discharge instructions were reviewed with pt; voiced understanding. Personal belongings were packed by pt. Pt. transported via wheelchair to private vehicle driven by spouse.

## 2020-06-01 ENCOUNTER — Encounter: Payer: Medicare Other | Admitting: Internal Medicine

## 2020-06-01 LAB — PROTEIN ELECTROPHORESIS, SERUM
A/G Ratio: 0.9 (ref 0.7–1.7)
Albumin ELP: 3.1 g/dL (ref 2.9–4.4)
Alpha-1-Globulin: 0.3 g/dL (ref 0.0–0.4)
Alpha-2-Globulin: 1.3 g/dL — ABNORMAL HIGH (ref 0.4–1.0)
Beta Globulin: 1.1 g/dL (ref 0.7–1.3)
Gamma Globulin: 0.6 g/dL (ref 0.4–1.8)
Globulin, Total: 3.3 g/dL (ref 2.2–3.9)
Total Protein ELP: 6.4 g/dL (ref 6.0–8.5)

## 2020-06-02 LAB — METHYLMALONIC ACID, SERUM: Methylmalonic Acid, Quantitative: 210 nmol/L (ref 0–378)

## 2020-06-06 ENCOUNTER — Encounter (HOSPITAL_COMMUNITY): Payer: Self-pay

## 2020-06-06 ENCOUNTER — Other Ambulatory Visit: Payer: Self-pay

## 2020-06-06 ENCOUNTER — Ambulatory Visit (HOSPITAL_COMMUNITY): Payer: Medicare Other | Attending: Internal Medicine

## 2020-06-06 DIAGNOSIS — M6281 Muscle weakness (generalized): Secondary | ICD-10-CM | POA: Diagnosis present

## 2020-06-06 DIAGNOSIS — R29898 Other symptoms and signs involving the musculoskeletal system: Secondary | ICD-10-CM | POA: Insufficient documentation

## 2020-06-06 DIAGNOSIS — R296 Repeated falls: Secondary | ICD-10-CM | POA: Insufficient documentation

## 2020-06-06 DIAGNOSIS — R278 Other lack of coordination: Secondary | ICD-10-CM | POA: Diagnosis present

## 2020-06-06 DIAGNOSIS — R2689 Other abnormalities of gait and mobility: Secondary | ICD-10-CM | POA: Insufficient documentation

## 2020-06-06 NOTE — Patient Instructions (Signed)
  Coordination Activities  Perform the following activities for 15-30 minutes 1-2 times per day with left hand(s).   Deal cards with your thumb (Hold deck in hand and push card off top with thumb).  Pick up coins, buttons, marbles, dried beans/pasta of different sizes and place in container.  Pick up coins and place in container or coin bank.     PUTTY KEY GRIP  Hold the putty at the top of your hand. Squeeze the putty between your thumb and the side of your 2nd finger as shown.   Complete for 3-5 minutes. Once a day    putty squeeze  Pt. should squeeze putty in hand trying to keep it round by rotating putty after each squeeze. push fingers through putty to palm each time   Complete for 3-5 minutes. Once a day

## 2020-06-06 NOTE — Therapy (Signed)
[facility] [address] [place], [place], [address] Phone: [phone]   Fax:  [phone]  Occupational Therapy Evaluation  Patient Details  Name: Dalton Porter MRN: [id number] Date of Birth: [birth year] Referring Provider (OT): Kathlen Mody MD   Encounter Date: 06/06/2020   OT End of Session - 06/06/20 1418    Visit Number 1    Number of Visits 8    Date for OT Re-Evaluation 07/04/20    Authorization Type Medicare    Authorization Time Period 80/20 $25.00 due    Progress Note Due on Visit 10    OT Start Time 1030    OT Stop Time 1115    OT Time Calculation (min) 45 min    Activity Tolerance Patient tolerated treatment well    Behavior During Therapy [facility] for tasks assessed/performed           Past Medical History:  Diagnosis Date  . Back pain, chronic   . CHF (congestive heart failure) (HCC)   . Diabetes mellitus   . Nonischemic cardiomyopathy (HCC)    a. EF 40% by echo in 2003 b. 03/2016: cath showing normal cors and EF of 25-35%, EF 40-45% by echo with diffuse hypokinesis.  . Stroke [facility])     Past Surgical History:  Procedure Laterality Date  . ANKLE SURGERY    . APPENDECTOMY    . BACK SURGERY     x3  . CARDIAC CATHETERIZATION  2002  . CARDIAC CATHETERIZATION N/A 04/08/2016   Procedure: Left Heart Cath and Coronary Angiography;  Surgeon: Peter M Swaziland, MD;  Location: [facility] INVASIVE CV LAB;  Service: Cardiovascular;  Laterality: N/A;  . CHOLECYSTECTOMY    . ELBOW SURGERY    . ICD IMPLANT  01/26/2018   SJM Fortify Assura VR ICD implanted by Dr Marily Memos for primary prevention of sudden death  . THUMB FUSION      There were no vitals filed for this visit.   Subjective Assessment - 06/06/20 1047    Subjective  S: I feel like the weakness began gradually over time.    Pertinent History Patient is a 64 y/o male S/P left side UE/LE weakness which he reports started gradually and then increased which caused him to go to  the ED on 05/28/20.He was admitted for stroke assessment. MRI was negative for acute intracranial abnormality. Mild white matter changes consistent with chronic microvascular ischemia. It was recommended that patient follow up with Nuerology. Dr. Blake Divine has referred patient to occupational therapy for evaluation and treatment.    Special Tests Complete DASH next session.    Patient Stated Goals Increase strength and coordination in LUE.    Currently in Pain? Yes    Pain Score 5     Pain Location Back    Pain Orientation Medial    Pain Descriptors / Indicators Sore;Constant    Pain Type Chronic pain    Pain Radiating Towards N/A    Pain Onset More than a month ago    Pain Frequency Constant    Aggravating Factors  N/A    Pain Relieving Factors N/A    Effect of Pain on Daily Activities min effect             [facility] OT Assessment - 06/06/20 1048      Assessment   Medical Diagnosis left side weakness    Referring Provider (OT) Kathlen Mody MD    Onset Date/Surgical Date 05/28/20   Pt reports weakness has  been gradually increasing for 3-4 mo   Hand Dominance Right    Next MD Visit --   Pt to make a follow up appointment with the nuerologist   Prior Therapy Previous OP PT for bilateral legs, ankles, and back.       Precautions   Precautions Fall      Restrictions   Weight Bearing Restrictions No      Balance Screen   Has the patient fallen in the past 6 months Yes    How many times? 3   most recent fall was 12/7. Left leg gave out   Has the patient had a decrease in activity level because of a fear of falling?  Yes    Is the patient reluctant to leave their home because of a fear of falling?  Yes      Home  Environment   Family/patient expects to be discharged to: Private residence    Living Arrangements Spouse/significant other      Prior Function   Level of Independence Independent    Vocation On disability      ADL   ADL comments Difficulty with managing amount of  weight as he used to. Decreased motor control with gross and fine motor tasks. Difficulty managing buttons, zippers, shoe tying.      Mobility   Mobility Status History of falls      Written Expression   Dominant Hand Right      Vision - History   Baseline Vision Wears contact   reading glasses   Additional Comments Pt reports left peripheral vision has decreased  slightly.       Cognition   Overall Cognitive Status Within Functional Limits for tasks assessed    Cognition Comments Speech. Pt demonstrates a consistant stutter and reports that is new.       Observation/Other Assessments   Outcome Measures Complete DASH next visit      Sensation   Light Touch Appears Intact    Stereognosis Impaired by gross assessment   see comment below   Hot/Cold Appears Intact    Proprioception Appears Intact    Additional Comments unable to feel the weight of the fork when holding it.      Coordination   Gross Motor Movements are Fluid and Coordinated No    Fine Motor Movements are Fluid and Coordinated No    9 Hole Peg Test Right;Left    Right 9 Hole Peg Test 43.9"    Left 9 Hole Peg Test 1'02"    Tremors Essential tremor noted in Bilateral hands with left worse than right.       ROM / Strength   AROM / PROM / Strength Strength;AROM      AROM   Overall AROM  Within functional limits for tasks performed    Overall AROM Comments BUE shoulder, elbow, and wrist ranges.       Strength   Overall Strength Comments Assessed seated. IR/er adducted    Strength Assessment Site Hand;Shoulder;Elbow;Wrist;Forearm    Right/Left Shoulder Left;Right    Right Shoulder Flexion 5/5    Right Shoulder ABduction 5/5    Right Shoulder Internal Rotation 5/5    Right Shoulder External Rotation 5/5    Left Shoulder Flexion 4+/5    Left Shoulder ABduction 5/5    Left Shoulder Internal Rotation 5/5    Left Shoulder External Rotation 5/5    Right/Left Elbow Left;Right    Right Elbow Flexion 5/5    Right  Elbow Extension 5/5    Left Elbow Flexion 5/5    Left Elbow Extension 5/5    Right/Left Forearm Left;Right    Right Forearm Pronation 5/5    Right Forearm Supination 5/5    Left Forearm Pronation 5/5    Left Forearm Supination 5/5    Right/Left Wrist Left;Right    Right Wrist Flexion 5/5    Right Wrist Extension 5/5    Left Wrist Flexion 5/5    Left Wrist Extension 5/5    Right/Left hand Left;Right    Right Hand Grip (lbs) 65    Right Hand Lateral Pinch 14 lbs    Right Hand 3 Point Pinch 12 lbs    Left Hand Grip (lbs) 40    Left Hand Lateral Pinch 8 lbs    Left Hand 3 Point Pinch 8 lbs                           OT Education - 06/06/20 1422    Education Details coordination activities, red theraputty for hand strengthening.    Person(s) Educated Patient    Methods Explanation;Demonstration;Handout;Verbal cues    Comprehension Returned demonstration;Verbalized understanding            OT Short Term Goals - 06/06/20 1426      OT SHORT TERM GOAL #1   Title Patient will be educated and independent with HEP in order to increase functional use of his LUE during all daily tasks demonstrating increased motor control and strength.    Time 4    Period Weeks    Status New    Target Date 07/04/20      OT SHORT TERM GOAL #2   Title Patient will increase his left grip strength by 15# and left pinch strength by 8# in order to hold onto and manipulate small object with greater stability and control without dropping.    Time 4    Period Weeks    Status New      OT SHORT TERM GOAL #3   Title Patient will increase left hand coordination by completing the 9 hole peg test in 45" or less in order to increase motor control and movement needed to complete self care tasks.    Time 4    Period Weeks    Status New      OT SHORT TERM GOAL #4   Title Patient will increase left shoulder/scapular stability while demonstrating increase motor control by completing box and blocks  test while moving 20 or more blocks in 1 minute.    Time 4    Period Weeks    Status New                    Plan - 06/06/20 1423    Clinical Impression Statement A: patient is a 64 y/o male S/P left side weakness with unknown cause.During evaluation, patient demonstrates full and functional BUE shoulder, elbow, and wrist strength. Presence of decreased bilateral hand strength and Left hand coordination causing increased difficulty completing daily tasks.    OT Occupational Profile and History Detailed Assessment- Review of Records and additional review of physical, cognitive, psychosocial history related to current functional performance    Occupational performance deficits (Please refer to evaluation for details): ADL's    Rehab Potential Good    Clinical Decision Making Limited treatment options, no task modification necessary    Comorbidities Affecting Occupational Performance: May have comorbidities impacting occupational  performance    Modification or Assistance to Complete Evaluation  No modification of tasks or assist necessary to complete eval    OT Frequency 2x / week    OT Duration 4 weeks    OT Treatment/Interventions Self-care/ADL training;Ultrasound;DME and/or AE instruction;Patient/family education;Electrical Stimulation;Moist Heat;Neuromuscular education;Therapeutic activities;Manual Therapy;Therapeutic exercise    Plan P: patient will benefit from skilled OT services to increase left hand coordination and strengthening allowing him to return to using her LUE as his nondominant extremity without increased difficulty. Treatment plan: Complete DASH  and box and blocks test next session; Coordination tasks, motor control and proximal shoulder strengthening/stability activities. hand strength.    OT Home Exercise Plan eval: coordination tasks, red theraputty (hand strength)    Recommended Other Services Speech therapy for stuttering. Will send a referral to MD to sign.     Consulted and Agree with Plan of Care Patient           Patient will benefit from skilled therapeutic intervention in order to improve the following deficits and impairments:           Visit Diagnosis: Other symptoms and signs involving the musculoskeletal system - Plan: Ot plan of care cert/re-cert  Other lack of coordination - Plan: Ot plan of care cert/re-cert    Problem List Patient Active Problem List   Diagnosis Date Noted  . Left-sided weakness 05/28/2020  . History of stroke 01/19/2019  . Nonischemic cardiomyopathy (HCC) 04/09/2016  . 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 Chest pain on exertion 01/15/2011  . HTN (hypertension) 01/15/2011   Limmie Patricia, OTR/L,CBIS  [phone]  06/06/2020, 3:20 PM  [facility] [address] [place], [place], [address] Phone: [phone]   Fax:  [phone]  Name: Dalton Porter MRN: [id number] Date of Birth: Apr 21, 1956

## 2020-06-08 ENCOUNTER — Other Ambulatory Visit: Payer: Self-pay

## 2020-06-08 ENCOUNTER — Ambulatory Visit (HOSPITAL_COMMUNITY): Payer: Medicare Other | Admitting: Occupational Therapy

## 2020-06-08 ENCOUNTER — Encounter (HOSPITAL_COMMUNITY): Payer: Self-pay | Admitting: Occupational Therapy

## 2020-06-08 DIAGNOSIS — R29898 Other symptoms and signs involving the musculoskeletal system: Secondary | ICD-10-CM | POA: Diagnosis not present

## 2020-06-08 DIAGNOSIS — R278 Other lack of coordination: Secondary | ICD-10-CM

## 2020-06-08 NOTE — Therapy (Signed)
[facility] [address] [place], [place], [address] Phone: [phone]   Fax:  [phone]  Occupational Therapy Treatment  Patient Details  Name: Dalton Porter MRN: [id number] Date of Birth: Mar 15, 1956 Referring Provider (OT): Kathlen Mody MD   Encounter Date: 06/08/2020   OT End of Session - 06/08/20 1017    Visit Number 2    Number of Visits 8    Date for OT Re-Evaluation 07/04/20    Authorization Type Medicare    Authorization Time Period 80/20 $25.00 due    Progress Note Due on Visit 10    OT Start Time 0931    OT Stop Time 1016    OT Time Calculation (min) 45 min    Activity Tolerance Patient tolerated treatment well    Behavior During Therapy [facility] for tasks assessed/performed           Past Medical History:  Diagnosis Date  . Back pain, chronic   . CHF (congestive heart failure) (HCC)   . Diabetes mellitus   . Nonischemic cardiomyopathy (HCC)    a. EF 40% by echo in 2003 b. 03/2016: cath showing normal cors and EF of 25-35%, EF 40-45% by echo with diffuse hypokinesis.  . Stroke [facility])     Past Surgical History:  Procedure Laterality Date  . ANKLE SURGERY    . APPENDECTOMY    . BACK SURGERY     x3  . CARDIAC CATHETERIZATION  2002  . CARDIAC CATHETERIZATION N/A 04/08/2016   Procedure: Left Heart Cath and Coronary Angiography;  Surgeon: Peter M Swaziland, MD;  Location: [facility] INVASIVE CV LAB;  Service: Cardiovascular;  Laterality: N/A;  . CHOLECYSTECTOMY    . ELBOW SURGERY    . ICD IMPLANT  01/26/2018   SJM Fortify Assura VR ICD implanted by Dr Marily Memos for primary prevention of sudden death  . THUMB FUSION      There were no vitals filed for this visit.   Subjective Assessment - 06/08/20 0930    Subjective  S: The exercises when good, I just wish I could do them better.    Special Tests DASH: 38.64    Currently in Pain? Yes    Pain Score 5     Pain Location Back    Pain Orientation Medial    Pain Descriptors /  Indicators Sore;Constant    Pain Type Chronic pain    Pain Radiating Towards N/A    Pain Onset More than a month ago    Pain Frequency Constant    Aggravating Factors  N/A    Pain Relieving Factors N/A    Effect of Pain on Daily Activities min effect on ADLs    Multiple Pain Sites No              [facility] OT Assessment - 06/08/20 0929      Assessment   Medical Diagnosis left side weakness      Precautions   Precautions Fall      Observation/Other Assessments   Quick DASH  38.64              Neldon Mc - 06/08/20 1012    Open a tight or new jar Severe difficulty    Do heavy household chores (wash walls, wash floors) Mild difficulty    Carry a shopping bag or briefcase Mild difficulty    Wash your back No difficulty    Use a knife to cut food Severe difficulty    Recreational  activities in which you take some force or impact through your arm, shoulder, or hand (golf, hammering, tennis) Unable    During the past week, to what extent has your arm, shoulder or hand problem interfered with your normal social activities with family, friends, neighbors, or groups? Not at all    During the past week, to what extent has your arm, shoulder or hand problem limited your work or other regular daily activities Modererately    Arm, shoulder, or hand pain. None    Tingling (pins and needles) in your arm, shoulder, or hand Severe    Difficulty Sleeping No difficulty    DASH Score 38.64 %                OT Treatments/Exercises (OP) - 06/08/20 0930      Exercises   Exercises Hand;Theraputty;Shoulder      Shoulder Exercises: Standing   Extension Theraband;10 reps    Theraband Level (Shoulder Extension) Level 2 (Red)    Row Theraband;10 reps    Theraband Level (Shoulder Row) Level 2 (Red)    Retraction Theraband;10 reps    Theraband Level (Shoulder Retraction) Level 2 (Red)      Shoulder Exercises: ROM/Strengthening   Ball on Wall 1' flexion, 1' abduction green ball     Other ROM/Strengthening Exercises Ball drop: green weighted ball, 1' flexion    Other ROM/Strengthening Exercises proximal shoulder strengthening: holding 2# weight at shoulder height, writing ABCs      Hand Exercises   Hand Gripper with Large Beads all beads gripper at 35#    Hand Gripper with Medium Beads all beads gripper at 35#    Other Hand Exercises Pt placing yellow, red, green, and blue clothespins along horizontal bar of pinch tree. Pt somewhat limited due to tremors increasing with fatigue, increased time for task completion with rest breaks as needed.      Fine Motor Coordination (Hand/Wrist)   Fine Motor Coordination Manipulating coins    Manipulating coins Pt holding coins in palm and working on palm to fingertip translation to place into slotted container. Pt completing 3x with 8-10 coins each trial. Dropping 3 coins on first trial, 2 coins on second trial, and 1 coin on 3rd trial. Increased time for task.                    OT Short Term Goals - 06/08/20 0160      OT SHORT TERM GOAL #1   Title Patient will be educated and independent with HEP in order to increase functional use of his LUE during all daily tasks demonstrating increased motor control and strength.    Time 4    Period Weeks    Status On-going    Target Date 07/04/20      OT SHORT TERM GOAL #2   Title Patient will increase his left grip strength by 15# and left pinch strength by 8# in order to hold onto and manipulate small object with greater stability and control without dropping.    Time 4    Period Weeks    Status On-going      OT SHORT TERM GOAL #3   Title Patient will increase left hand coordination by completing the 9 hole peg test in 45" or less in order to increase motor control and movement needed to complete self care tasks.    Time 4    Period Weeks    Status On-going      OT SHORT  TERM GOAL #4   Title Patient will increase left shoulder/scapular stability while demonstrating  increase motor control by completing box and blocks test while moving 20 or more blocks in 1 minute.    Time 4    Period Weeks    Status On-going                    Plan - 06/08/20 1009    Clinical Impression Statement A: Pt reports completing all exercises on HEP. Initiated proximal shoulder strengthening and stability work, as well as grip and pinch strengthening, and coordination tasks. Attempted sponge task using resistive clothespin however pt with max difficulty due to tremors and task was ended. Pt was able to complete pinch tree using resistive clothespins. Tremors increase with fatigue, educated pt on taking short breaks as needed. Verbal cuing for form and technique during tasks.    Body Structure / Function / Physical Skills ADL;UE functional use;FMC;GMC;IADL;Strength    Plan P: Continue with proximal shoulder strengthening, provide suggestions for pinch strengthening at home    OT Home Exercise Plan eval: coordination tasks, red theraputty (hand strength)    Consulted and Agree with Plan of Care Patient           Patient will benefit from skilled therapeutic intervention in order to improve the following deficits and impairments:   Body Structure / Function / Physical Skills: ADL,UE functional use,FMC,GMC,IADL,Strength       Visit Diagnosis: Other symptoms and signs involving the musculoskeletal system  Other lack of coordination    Problem List Patient Active Problem List   Diagnosis Date Noted  . Left-sided weakness 05/28/2020  . History of stroke 01/19/2019  . Nonischemic cardiomyopathy (HCC) 04/09/2016  . 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 Chest pain on exertion 01/15/2011  . HTN (hypertension) 01/15/2011    Ezra Sites, OTR/L  [phone] 06/08/2020, 10:18 AM  [facility] [address] [place], [place], [address] Phone: [phone]   Fax:   [phone]  Name: Dalton Porter MRN: [id number] Date of Birth: 01-16-1956

## 2020-06-11 ENCOUNTER — Ambulatory Visit (INDEPENDENT_AMBULATORY_CARE_PROVIDER_SITE_OTHER): Payer: Medicare Other

## 2020-06-11 DIAGNOSIS — I428 Other cardiomyopathies: Secondary | ICD-10-CM

## 2020-06-11 DIAGNOSIS — I5022 Chronic systolic (congestive) heart failure: Secondary | ICD-10-CM

## 2020-06-11 LAB — CUP PACEART REMOTE DEVICE CHECK
Battery Remaining Longevity: 94 mo
Battery Remaining Percentage: 81 %
Battery Voltage: 3.02 V
Brady Statistic RV Percent Paced: 3.1 %
Date Time Interrogation Session: 20211213020018
HighPow Impedance: 89 Ohm
HighPow Impedance: 89 Ohm
Implantable Lead Implant Date: 20190730
Implantable Lead Location: 753860
Implantable Pulse Generator Implant Date: 20190730
Lead Channel Impedance Value: 360 Ohm
Lead Channel Pacing Threshold Amplitude: 0.75 V
Lead Channel Pacing Threshold Pulse Width: 0.5 ms
Lead Channel Sensing Intrinsic Amplitude: 11.5 mV
Lead Channel Setting Pacing Amplitude: 2.5 V
Lead Channel Setting Pacing Pulse Width: 0.5 ms
Lead Channel Setting Sensing Sensitivity: 0.5 mV
Pulse Gen Serial Number: 9839597

## 2020-06-12 ENCOUNTER — Other Ambulatory Visit: Payer: Self-pay

## 2020-06-12 ENCOUNTER — Ambulatory Visit (HOSPITAL_COMMUNITY): Payer: Medicare Other

## 2020-06-12 ENCOUNTER — Ambulatory Visit: Payer: Medicare Other

## 2020-06-12 ENCOUNTER — Encounter (HOSPITAL_COMMUNITY): Payer: Self-pay

## 2020-06-12 DIAGNOSIS — R29898 Other symptoms and signs involving the musculoskeletal system: Secondary | ICD-10-CM | POA: Diagnosis not present

## 2020-06-12 DIAGNOSIS — I5022 Chronic systolic (congestive) heart failure: Secondary | ICD-10-CM

## 2020-06-12 DIAGNOSIS — R278 Other lack of coordination: Secondary | ICD-10-CM

## 2020-06-12 DIAGNOSIS — Z95810 Presence of automatic (implantable) cardiac defibrillator: Secondary | ICD-10-CM

## 2020-06-12 NOTE — Therapy (Signed)
[facility] [address] [place], [place], [address] Phone: [phone]   Fax:  [phone]  Occupational Therapy Treatment  Patient Details  Name: Dalton Porter MRN: [id number] Date of Birth: August 11, 1955 Referring Provider (OT): Kathlen Mody MD   Encounter Date: 06/12/2020   OT End of Session - 06/12/20 1024    Visit Number 3    Number of Visits 8    Date for OT Re-Evaluation 07/04/20    Authorization Type Medicare    Authorization Time Period 80/20 $25.00 due    Progress Note Due on Visit 10    OT Start Time 0945    OT Stop Time 1025    OT Time Calculation (min) 40 min    Activity Tolerance Patient tolerated treatment well    Behavior During Therapy [facility] for tasks assessed/performed           Past Medical History:  Diagnosis Date  . Back pain, chronic   . CHF (congestive heart failure) (HCC)   . Diabetes mellitus   . Nonischemic cardiomyopathy (HCC)    a. EF 40% by echo in 2003 b. 03/2016: cath showing normal cors and EF of 25-35%, EF 40-45% by echo with diffuse hypokinesis.  . Stroke [facility])     Past Surgical History:  Procedure Laterality Date  . ANKLE SURGERY    . APPENDECTOMY    . BACK SURGERY     x3  . CARDIAC CATHETERIZATION  2002  . CARDIAC CATHETERIZATION N/A 04/08/2016   Procedure: Left Heart Cath and Coronary Angiography;  Surgeon: Peter M Swaziland, MD;  Location: [facility] INVASIVE CV LAB;  Service: Cardiovascular;  Laterality: N/A;  . CHOLECYSTECTOMY    . ELBOW SURGERY    . ICD IMPLANT  01/26/2018   SJM Fortify Assura VR ICD implanted by Dr Marily Memos for primary prevention of sudden death  . THUMB FUSION      There were no vitals filed for this visit.   Subjective Assessment - 06/12/20 0947    Subjective  S: I can tell this is helping.    Currently in Pain? Yes    Pain Score 5     Pain Location Back    Pain Orientation Medial    Pain Descriptors / Indicators Sore;Constant    Pain Type Chronic pain    Pain  Onset More than a month ago    Pain Frequency Constant    Aggravating Factors  N/A    Pain Relieving Factors N/A    Effect of Pain on Daily Activities min effect on ADLs    Multiple Pain Sites No              [facility] OT Assessment - 06/12/20 0957      Assessment   Medical Diagnosis left side weakness      Precautions   Precautions Fall      Coordination   Box and Blocks right: 31 left: 33                    OT Treatments/Exercises (OP) - 06/12/20 0948      Exercises   Exercises Shoulder;Hand;Theraputty      Shoulder Exercises: Therapy Ball   Other Therapy Ball Exercises seated strengthening with green therapy ball. Chest press, circles (both directions), flexion, 12X      Shoulder Exercises: ROM/Strengthening   UBE (Upper Arm Bike) level 1 2' forward 2' reverse   pace: 4.5-5.0   Proximal Shoulder Strengthening, Seated 10X  A/ROM no rest breaks    Other ROM/Strengthening Exercises Ball drop: green weighted ball, 1' flexion    Other ROM/Strengthening Exercises proximal shoulder strengthening: holding 2# weight at shoulder height, writing ABCs      Hand Exercises   Sponges 10,16    Other Hand Exercises Utilized red resistive clothespin in left hand and 3 point pinch to pick and transfer 24 sponges from table to container.      Fine Motor Coordination (Hand/Wrist)   Fine Motor Coordination Dealing card with thumb    Dealing card with thumb Utilized left hand to deal deck of cards with thumb. slow and controlled pace for accuracy.                    OT Short Term Goals - 06/12/20 1024      OT SHORT TERM GOAL #1   Title Patient will be educated and independent with HEP in order to increase functional use of his LUE during all daily tasks demonstrating increased motor control and strength.    Time 4    Period Weeks    Status On-going    Target Date 07/04/20      OT SHORT TERM GOAL #2   Title Patient will increase his left grip strength by 15# and  left pinch strength by 8# in order to hold onto and manipulate small object with greater stability and control without dropping.    Time 4    Period Weeks    Status On-going      OT SHORT TERM GOAL #3   Title Patient will increase left hand coordination by completing the 9 hole peg test in 45" or less in order to increase motor control and movement needed to complete self care tasks.    Time 4    Period Weeks    Status On-going      OT SHORT TERM GOAL #4   Title Patient will increase left shoulder/scapular stability while demonstrating increase motor control by completing box and blocks test while moving 20 or more blocks in 1 minute.    Time 4    Period Weeks    Status On-going                    Plan - 06/12/20 1332    Clinical Impression Statement A: Focused on proximal shoulder strengthening and coordination during session. patient completed blocks and box assessement while demonstrating overall generalized improvement with motor control from intial evaluation. VC for form and technique were provided during session. Essential tremors continue to increase in intensity when exhibit maximum effort.    Body Structure / Function / Physical Skills ADL;UE functional use;FMC;GMC;IADL;Strength    Plan P: Continue with proximal shoulder strengthening; continue with ball on the wall. In hand manipulation and hand strengthening.    Consulted and Agree with Plan of Care Patient           Patient will benefit from skilled therapeutic intervention in order to improve the following deficits and impairments:   Body Structure / Function / Physical Skills: ADL,UE functional use,FMC,GMC,IADL,Strength       Visit Diagnosis: Other lack of coordination  Other symptoms and signs involving the musculoskeletal system    Problem List Patient Active Problem List   Diagnosis Date Noted  . Left-sided weakness 05/28/2020  . History of stroke 01/19/2019  . Nonischemic cardiomyopathy  (HCC) 04/09/2016  . 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 Chest  pain on exertion 01/15/2011  . HTN (hypertension) 01/15/2011   Limmie Patricia, OTR/L,CBIS  [phone]  06/12/2020, 2:28 PM  [facility] [address] [place], [place], [address] Phone: [phone]   Fax:  [phone]  Name: SIMCHA SPEIR MRN: [id number] Date of Birth: 16-May-1956

## 2020-06-13 ENCOUNTER — Encounter (HOSPITAL_COMMUNITY): Payer: Self-pay | Admitting: Physical Therapy

## 2020-06-13 ENCOUNTER — Ambulatory Visit (HOSPITAL_COMMUNITY): Payer: Medicare Other | Admitting: Physical Therapy

## 2020-06-13 ENCOUNTER — Ambulatory Visit (HOSPITAL_COMMUNITY): Payer: Medicare Other | Admitting: Occupational Therapy

## 2020-06-13 ENCOUNTER — Encounter (HOSPITAL_COMMUNITY): Payer: Self-pay | Admitting: Occupational Therapy

## 2020-06-13 DIAGNOSIS — R29898 Other symptoms and signs involving the musculoskeletal system: Secondary | ICD-10-CM | POA: Diagnosis not present

## 2020-06-13 DIAGNOSIS — R2689 Other abnormalities of gait and mobility: Secondary | ICD-10-CM

## 2020-06-13 DIAGNOSIS — R278 Other lack of coordination: Secondary | ICD-10-CM

## 2020-06-13 DIAGNOSIS — R296 Repeated falls: Secondary | ICD-10-CM

## 2020-06-13 DIAGNOSIS — M6281 Muscle weakness (generalized): Secondary | ICD-10-CM

## 2020-06-13 NOTE — Therapy (Signed)
[facility] [address] [place], [place], [address] Phone: [phone]   Fax:  [phone]  Occupational Therapy Treatment  Patient Details  Name: Dalton Porter MRN: [id number] Date of Birth: 1955/09/20 Referring Provider (OT): Kathlen Mody MD   Encounter Date: 06/13/2020   OT End of Session - 06/13/20 1045    Visit Number 4    Number of Visits 8    Date for OT Re-Evaluation 07/04/20    Authorization Type Medicare    Authorization Time Period 80/20 $25.00 due    Progress Note Due on Visit 10    OT Start Time 1005    OT Stop Time 1045    OT Time Calculation (min) 40 min    Activity Tolerance Patient tolerated treatment well    Behavior During Therapy [facility] for tasks assessed/performed           Past Medical History:  Diagnosis Date  . Back pain, chronic   . CHF (congestive heart failure) (HCC)   . Diabetes mellitus   . Nonischemic cardiomyopathy (HCC)    a. EF 40% by echo in 2003 b. 03/2016: cath showing normal cors and EF of 25-35%, EF 40-45% by echo with diffuse hypokinesis.  . Stroke [facility])     Past Surgical History:  Procedure Laterality Date  . ANKLE SURGERY    . APPENDECTOMY    . BACK SURGERY     x3  . CARDIAC CATHETERIZATION  2002  . CARDIAC CATHETERIZATION N/A 04/08/2016   Procedure: Left Heart Cath and Coronary Angiography;  Surgeon: Peter M Swaziland, MD;  Location: [facility] INVASIVE CV LAB;  Service: Cardiovascular;  Laterality: N/A;  . CHOLECYSTECTOMY    . ELBOW SURGERY    . ICD IMPLANT  01/26/2018   SJM Fortify Assura VR ICD implanted by Dr Marily Memos for primary prevention of sudden death  . THUMB FUSION      There were no vitals filed for this visit.   Subjective Assessment - 06/13/20 1002    Subjective  S: I'm feeling better.    Currently in Pain? Yes    Pain Score 5     Pain Location Back    Pain Orientation Mid;Medial    Pain Descriptors / Indicators Sore;Constant    Pain Type Chronic pain    Pain  Radiating Towards N/A    Pain Onset More than a month ago    Pain Frequency Constant    Aggravating Factors  N/A    Pain Relieving Factors N/A    Effect of Pain on Daily Activities min effect on ADLs    Multiple Pain Sites No              [facility] OT Assessment - 06/13/20 1002      Assessment   Medical Diagnosis left side weakness      Precautions   Precautions Fall      Restrictions   Weight Bearing Restrictions No                    OT Treatments/Exercises (OP) - 06/13/20 1007      Exercises   Exercises Shoulder;Hand;Theraputty      Shoulder Exercises: Standing   Extension Theraband;10 reps    Theraband Level (Shoulder Extension) Level 2 (Red)    Row Theraband;10 reps    Theraband Level (Shoulder Row) Level 2 (Red)    Retraction Theraband;10 reps    Theraband Level (Shoulder Retraction) Level 2 (Red)  Shoulder Exercises: Therapy Ball   Other Therapy Ball Exercises seated strengthening with green therapy ball. Chest press, circles (both directions), flexion, 12X      Shoulder Exercises: ROM/Strengthening   Ball on Wall 1' flexion, 1' abduction green ball    Other ROM/Strengthening Exercises Ball drop: green weighted ball, 1' flexion    Other ROM/Strengthening Exercises proximal shoulder strengthening: holding 2# weight at shoulder height, writing ABCs      Hand Exercises   Hand Gripper with Large Beads all beads gripper at 35# horizontal    Hand Gripper with Medium Beads all beads gripper at 35# horizontal      Fine Motor Coordination (Hand/Wrist)   Fine Motor Coordination Grooved pegs    Grooved pegs Pt holding 5 pegs in palm, working on palm to fingertip translation, then placing grooved peg into pegboard. Pt dropping pegs occasionally, increased time for manipulation and placement. Pt then removing and holding pegs in palm before placing back into container.Pt dropping 2 pegs while removing.                    OT Short Term Goals -  06/12/20 1024      OT SHORT TERM GOAL #1   Title Patient will be educated and independent with HEP in order to increase functional use of his LUE during all daily tasks demonstrating increased motor control and strength.    Time 4    Period Weeks    Status On-going    Target Date 07/04/20      OT SHORT TERM GOAL #2   Title Patient will increase his left grip strength by 15# and left pinch strength by 8# in order to hold onto and manipulate small object with greater stability and control without dropping.    Time 4    Period Weeks    Status On-going      OT SHORT TERM GOAL #3   Title Patient will increase left hand coordination by completing the 9 hole peg test in 45" or less in order to increase motor control and movement needed to complete self care tasks.    Time 4    Period Weeks    Status On-going      OT SHORT TERM GOAL #4   Title Patient will increase left shoulder/scapular stability while demonstrating increase motor control by completing box and blocks test while moving 20 or more blocks in 1 minute.    Time 4    Period Weeks    Status On-going                    Plan - 06/13/20 1045    Clinical Impression Statement A: Continued with proximal shoulder strengthening and stability work using green therapy ball and ball on wall. Continued with grip strengthening and fine motor coordination work adding grooved pegboard. Pt requiring increased time for pegboard task for in-hand manipulation. Pt with improvement in activity tolerance and stability during tasks, minimal tremors today. Verbal cuing for form and technique.    Body Structure / Function / Physical Skills ADL;UE functional use;FMC;GMC;IADL;Strength    Plan P: pinch strengthening tasks, small pegboard with tweezers    OT Home Exercise Plan eval: coordination tasks, red theraputty (hand strength)    Consulted and Agree with Plan of Care Patient           Patient will benefit from skilled therapeutic  intervention in order to improve the following deficits and impairments:   Body  Structure / Function / Physical Skills: ADL,UE functional use,FMC,GMC,IADL,Strength       Visit Diagnosis: Other symptoms and signs involving the musculoskeletal system  Other lack of coordination    Problem List Patient Active Problem List   Diagnosis Date Noted  . Left-sided weakness 05/28/2020  . History of stroke 01/19/2019  . Nonischemic cardiomyopathy (HCC) 04/09/2016  . 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 Chest pain on exertion 01/15/2011  . HTN (hypertension) 01/15/2011   Ezra Sites, OTR/L  [phone] 06/13/2020, 10:48 AM  [facility] [address] [place], [place], [address] Phone: [phone]   Fax:  [phone]  Name: Dalton Porter MRN: [id number] Date of Birth: February 03, 1956

## 2020-06-13 NOTE — Therapy (Signed)
[facility] [address] [place], [place], [address] Phone: [phone]   Fax:  [phone]  Physical Therapy Evaluation  Patient Details  Name: Dalton Porter MRN: [id number] Date of Birth: [birth year] Referring Provider (PT): Kathlen Mody MD   Encounter Date: 06/13/2020   PT End of Session - 06/13/20 0907    Visit Number 1    Number of Visits 12    Date for PT Re-Evaluation 07/25/20    Authorization Type Medicare    Progress Note Due on Visit 10    PT Start Time 0910    PT Stop Time 0955    PT Time Calculation (min) 45 min    Equipment Utilized During Treatment Gait belt    Activity Tolerance Patient tolerated treatment well    Behavior During Therapy [facility] for tasks assessed/performed           Past Medical History:  Diagnosis Date   Back pain, chronic    CHF (congestive heart failure) (HCC)    Diabetes mellitus    Nonischemic cardiomyopathy (HCC)    a. EF 40% by echo in 2003 b. 03/2016: cath showing normal cors and EF of 25-35%, EF 40-45% by echo with diffuse hypokinesis.   Stroke [facility]     Past Surgical History:  Procedure Laterality Date   ANKLE SURGERY     APPENDECTOMY     BACK SURGERY     x3   CARDIAC CATHETERIZATION  2002   CARDIAC CATHETERIZATION N/A 04/08/2016   Procedure: Left Heart Cath and Coronary Angiography;  Surgeon: Peter M Swaziland, MD;  Location: [facility] INVASIVE CV LAB;  Service: Cardiovascular;  Laterality: N/A;   CHOLECYSTECTOMY     ELBOW SURGERY     ICD IMPLANT  01/26/2018   SJM Fortify Assura VR ICD implanted by Dr Marily Memos for primary prevention of sudden death   THUMB FUSION      There were no vitals filed for this visit.    Subjective Assessment - 06/13/20 0909    Subjective Patient is a 64 y.o. male who presents to physical therapy with c/o left sided weakness. Patient states gradual onset of symptoms over the last 3-4 months which led to him going to the ED on 05/28/20. He was  admitted for stroke work up which was negative for acute abnormality. He has been having weakness and falls. His left leg feels heavy. Weakness is worse in the mornings. Once he gets up and gets going its better. Most of his falls occur at night when getting up or first gets up. His leg feels like it is not there when first gets up. He has to stand for several minutes before being able to walk. Occasionally it feels like it is asleep. His main goal is to not fall.    Limitations Standing;House hold activities    How long can you walk comfortably? 15-20 minutes    Patient Stated Goals to not fall    Currently in Pain? Yes    Pain Score 5     Pain Location Back    Pain Orientation Mid;Medial    Pain Descriptors / Indicators Sore;Constant    Pain Type Chronic pain              [facility] PT Assessment - 06/13/20 0001      Assessment   Medical Diagnosis left side weakness    Referring Provider (PT) Kathlen Mody MD    Onset Date/Surgical Date 05/28/20   gradual onset  over last 3-4 months   Next MD Visit Pt to make a follow up appointment with the nuerologist    Prior Therapy Previous OP PT for bilateral legs, ankles, and back.       Precautions   Precautions Fall      Restrictions   Weight Bearing Restrictions No      Balance Screen   Has the patient fallen in the past 6 months Yes    How many times? 4    Has the patient had a decrease in activity level because of a fear of falling?  Yes    Is the patient reluctant to leave their home because of a fear of falling?  Yes      Home Environment   Living Environment Private residence    Living Arrangements Spouse/significant other      Prior Function   Level of Independence Independent    Vocation On disability    Leisure help with cooking      Cognition   Overall Cognitive Status Within Functional Limits for tasks assessed      Observation/Other Assessments   Observations Ambulates without AD, muscle atrophy throughout bilateral  LE    Focus on Therapeutic Outcomes (FOTO)  n/a      Sensation   Light Touch Impaired Detail    Proprioception Impaired Detail    Proprioception Impaired Details Impaired LLE    Additional Comments decreased L L4, abscent L5-S1 bilateral      ROM / Strength   AROM / PROM / Strength AROM;Strength      Strength   Strength Assessment Site Hip;Knee;Ankle    Right/Left Hip Right;Left    Right Hip Flexion 4-/5    Left Hip Flexion 3+/5    Right/Left Knee Right;Left    Right Knee Flexion 4+/5    Right Knee Extension 5/5    Left Knee Flexion 4/5    Left Knee Extension 4-/5    Right/Left Ankle Right;Left    Right Ankle Dorsiflexion 4/5    Left Ankle Dorsiflexion 3+/5      Transfers   Five time sit to stand comments  31.65 seconds, trembling throughout LE thoroughout, several losses of balance, impaired motor control especially with eccentric portion, slow, labored, fatigued following      Ambulation/Gait   Ambulation/Gait Yes    Ambulation/Gait Assistance 4: Min guard    Ambulation Distance (Feet) 250 Feet    Assistive device None    Gait Pattern Decreased stance time - left;Poor foot clearance - left;Poor foot clearance - right;Wide base of support    Ambulation Surface Level;Indoor    Gait velocity decreased    Gait Comments      Standardized Balance Assessment   Standardized Balance Assessment Dynamic Gait Index      Dynamic Gait Index   Level Surface Moderate Impairment    Change in Gait Speed Moderate Impairment    Gait with Horizontal Head Turns Moderate Impairment    Gait with Vertical Head Turns Moderate Impairment    Gait and Pivot Turn Moderate Impairment    Step Over Obstacle Moderate Impairment    Step Around Obstacles Moderate Impairment    Steps Mild Impairment    Total Score 9    DGI comment: high risk for falling                      Objective measurements completed on examination: See above findings.       [facility]  Adult PT  Treatment/Exercise - 06/13/20 0001      Exercises   Exercises Knee/Hip      Knee/Hip Exercises: Standing   Gait Training 150 feet with [facility]                  PT Education - 06/13/20 0905    Education Details Patient educated on exam findings, POC, scope of PT, use of AD, SPC and RW use for ambulation to increase BOS    Person(s) Educated Patient    Methods Explanation;Demonstration    Comprehension Verbalized understanding;Returned demonstration            PT Short Term Goals - 06/13/20 1010      PT SHORT TERM GOAL #1   Title Patient will be independent with HEP in order to improve functional outcomes.    Time 3    Period Weeks    Status New    Target Date 07/04/20      PT SHORT TERM GOAL #2   Title Patient will report at least 25% improvement in symptoms for improved quality of life.    Time 3    Period Weeks    Status New    Target Date 07/04/20             PT Long Term Goals - 06/13/20 1010      PT LONG TERM GOAL #1   Title Patient will report at least 75% improvement in symptoms for improved quality of life.    Time 6    Period Weeks    Status New    Target Date 07/25/20      PT LONG TERM GOAL #2   Title Patient will be able to complete 5x STS in under 20 seconds in order to reduce the risk of falls.    Time 6    Period Weeks    Status New    Target Date 07/25/20      PT LONG TERM GOAL #3   Title Patient will score at least 15/24 on DGI in order to demonstrate decreased risk for falling.    Time 6    Period Weeks    Status New    Target Date 07/25/20                  Plan - 06/13/20 1001    Clinical Impression Statement Patient is a 64 y.o. male who presents to physical therapy with c/o left sided weakness and balance deficits. He presents with deficits in bilateral LE strength, endurance, activity tolerance, gait, static and dynamic balance, transfers, and functional mobility with ADL. He has greater deficit with LLE. He is having  to modify and restrict ADL as indicated by DGI score as well as subjective information and objective measures which is affecting overall participation. Patient at a very high risk for falls as shown by DGI performance and 5x STS testing. Patient educated on use of AD at home to reduce the risk of falls and increase BOS. Patient ambulates with improved steadiness with use of SPC today and is given cueing for sequencing. Patient will benefit from skilled physical therapy in order to improve function and reduce impairment.    Personal Factors and Comorbidities Age;Fitness;Past/Current Experience;Time since onset of injury/illness/exacerbation;Comorbidity 3+    Comorbidities DM, neuropathy, chronic back pain, HTN    Examination-Activity Limitations Locomotion Level;Transfers;Bathing;Caring for Others;Dressing;Lift;Stand;Stairs;Squat    Examination-Participation Restrictions Meal Prep;Church;Cleaning;Yard Work;Volunteer;Occupation;Shop;Community Activity    Clinical Decision Making Moderate  Rehab Potential Good    PT Frequency 2x / week    PT Duration 6 weeks    PT Treatment/Interventions ADLs/Self Care Home Management;Cryotherapy;Electrical Stimulation;Aquatic Therapy;Moist Heat;Traction;Ultrasound;DME Instruction;Gait training;Stair training;Functional mobility training;Therapeutic activities;Therapeutic exercise;Balance training;Neuromuscular re-education;Patient/family education;Manual techniques;Dry needling;Energy conservation;Splinting;Spinal Manipulations;Joint Manipulations    PT Next Visit Plan f/u on RW/ SPC use, begin LE strengtheing and static and dynamic balance training    PT Home Exercise Plan 12/15 using AD with ambulation    Consulted and Agree with Plan of Care Patient           Patient will benefit from skilled therapeutic intervention in order to improve the following deficits and impairments:  Abnormal gait,Difficulty walking,Decreased endurance,Decreased activity  tolerance,Pain,Decreased balance,Improper body mechanics,Decreased mobility,Decreased strength,Impaired sensation  Visit Diagnosis: Muscle weakness (generalized)  Other abnormalities of gait and mobility  Other symptoms and signs involving the musculoskeletal system  Repeated falls     Problem List Patient Active Problem List   Diagnosis Date Noted   Left-sided weakness 05/28/2020   History of stroke 01/19/2019   Nonischemic cardiomyopathy (HCC) 04/09/2016   Diabetes mellitus type 2 with neurological manifestations (HCC) 01/15/2011   Chest pain on exertion 01/15/2011   HTN (hypertension) 01/15/2011    10:15 AM, 06/13/20 Wyman Songster PT, DPT Physical Therapist at [facility]    [facility] [address] [place], [place], [address] Phone: [phone]   Fax:  [phone]  Name: Dalton Porter MRN: [id number] Date of Birth: [birth year]

## 2020-06-15 ENCOUNTER — Encounter: Payer: Self-pay | Admitting: Internal Medicine

## 2020-06-15 ENCOUNTER — Ambulatory Visit (INDEPENDENT_AMBULATORY_CARE_PROVIDER_SITE_OTHER): Payer: Medicare Other | Admitting: Internal Medicine

## 2020-06-15 VITALS — BP 100/60 | HR 68 | Ht 71.0 in | Wt 236.8 lb

## 2020-06-15 DIAGNOSIS — I428 Other cardiomyopathies: Secondary | ICD-10-CM

## 2020-06-15 DIAGNOSIS — E785 Hyperlipidemia, unspecified: Secondary | ICD-10-CM | POA: Diagnosis not present

## 2020-06-15 DIAGNOSIS — I10 Essential (primary) hypertension: Secondary | ICD-10-CM

## 2020-06-15 DIAGNOSIS — I5022 Chronic systolic (congestive) heart failure: Secondary | ICD-10-CM

## 2020-06-15 NOTE — Progress Notes (Signed)
EPIC Encounter for ICM Monitoring  Patient Name: Dalton Porter is a 64 y.o. male Date: 06/15/2020 Primary Care [facility]: [facility] Primary Cardiologist:Branch Electrophysiologist:Allred 06/15/2020 Office Weight:236lbs   Transmission reviewed. Patient had office appointment with Dr Johney Frame 06/15/2020  CorvueThoracic impedancenormalfluid levels but was suggesting possible fluid accumulation from 12/3 - 12/8.  Prescribed:   Furosemide20 mgtake2 tablets (40 mgtotal)daily.  Spironolactone 25 mg take 1 tablet daily  Labs: 01/10/2020 Creatinine 1.52, BUN 28, Potassium 4.7, Sodium 142, GFR 48-56, BNP 303Care Everywhere A complete set of results can be found in Results Review.  Recommendations:No changes  Follow-up plan: ICM clinic phone appointment on2/02/2021. 91 day device clinic remote transmission3/14/2022.  EP/Cardiology Office Visits:06/15/2020 with Dr.Allred and 06/19/2020 with Dr Wyline Mood.   Copy of ICM check sent to Dr.Allred    3 month ICM trend: 06/12/2020    1 Year ICM trend:       Karie Soda, RN 06/15/2020 2:36 PM

## 2020-06-15 NOTE — Progress Notes (Signed)
[facility] Primary Cardiologist: Dr Wyline Mood Primary EP: Dr Johney Frame  Dalton Porter is a 64 y.o. male who presents today for routine electrophysiology followup.  Since last being seen in our clinic, the patient reports doing very well.  Today, he denies symptoms of palpitations, chest pain, shortness of breath,  lower extremity edema, dizziness, presyncope, syncope, or ICD shocks.  The patient is otherwise without complaint today.   Past Medical History:  Diagnosis Date  . Back pain, chronic   . CHF (congestive heart failure) (HCC)   . Diabetes mellitus   . Nonischemic cardiomyopathy (HCC)    a. EF 40% by echo in 2003 b. 03/2016: cath showing normal cors and EF of 25-35%, EF 40-45% by echo with diffuse hypokinesis.  . Stroke [facility])    Past Surgical History:  Procedure Laterality Date  . ANKLE SURGERY    . APPENDECTOMY    . BACK SURGERY     x3  . CARDIAC CATHETERIZATION  2002  . CARDIAC CATHETERIZATION N/A 04/08/2016   Procedure: Left Heart Cath and Coronary Angiography;  Surgeon: Peter M Swaziland, MD;  Location: [facility] INVASIVE CV LAB;  Service: Cardiovascular;  Laterality: N/A;  . CHOLECYSTECTOMY    . ELBOW SURGERY    . ICD IMPLANT  01/26/2018   SJM Fortify Assura VR ICD implanted by Dr Marily Memos for primary prevention of sudden death  . THUMB FUSION      ROS- all systems are reviewed and negative except as per HPI above  Current Outpatient Medications  Medication Sig Dispense Refill  . aspirin 325 MG tablet Take 325 mg by mouth daily.    [name] atorvastatin (LIPITOR) 20 MG tablet Take 1 tablet (20 mg total) by mouth daily. 90 tablet 3  . Cholecalciferol (VITAMIN D3) 5000 units CAPS Take 1 capsule by mouth daily.    . furosemide (LASIX) 20 MG tablet Take 2 tablets (40 mg total) by mouth daily. 270 tablet 0  . gabapentin (NEURONTIN) 300 MG capsule Take 300 mg by mouth 3 (three) times daily.    [name] glipiZIDE (GLUCOTROL) 5 MG tablet Take 5 mg by mouth 2 (two) times daily before  a meal.    . metFORMIN (GLUCOPHAGE) 500 MG tablet Take 500 mg by mouth 2 (two) times daily with a meal.    . metoprolol succinate (TOPROL XL) 25 MG 24 hr tablet Take 1 tablet (25 mg total) by mouth 2 (two) times daily. 45 tablet 0  . nitroGLYCERIN (NITROSTAT) 0.4 MG SL tablet Place 1 tablet (0.4 mg total) under the tongue every 5 (five) minutes as needed for chest pain. 25 tablet 3  . sacubitril-valsartan (ENTRESTO) 24-26 MG Take 1 tablet by mouth 2 (two) times daily. 180 tablet 3  . spironolactone (ALDACTONE) 25 MG tablet Take 25 mg by mouth daily. [facility] and pt reports on 11/30/2018 that he takes daily.    . TRAZODONE HCL PO Take 100 mg by mouth at bedtime.     . vitamin B-12 (CYANOCOBALAMIN) 500 MCG tablet Take 1 tablet (500 mcg total) by mouth daily. 30 tablet 1   No current facility-administered medications for this visit.    Physical Exam: Vitals:   06/15/20 0933  BP: 100/60  Pulse: 68  Weight: 236 lb 12.8 oz (107.4 kg)  Height: 5\' 11"  (1.803 m)    GEN- The patient is well appearing, alert and oriented x 3 today.   Head- normocephalic, atraumatic Eyes-  Sclera clear, conjunctiva pink Ears-  hearing intact Oropharynx- clear Lungs- Clear to ausculation bilaterally, normal work of breathing Chest- ICD pocket is well healed Heart- Regular rate and rhythm, no murmurs, rubs or gallops, PMI not laterally displaced GI- soft, NT, ND, + BS Extremities- no clubbing, cyanosis, or edema  ICD interrogation- reviewed in detail today,  See PACEART report    Wt Readings from Last 3 Encounters:  06/15/20 236 lb 12.8 oz (107.4 kg)  05/28/20 240 lb (108.9 kg)  12/13/19 243 lb (110.2 kg)    Assessment and Plan:  1.  Chronic systolic dysfunction/ nonischemic CM euvolemic today Stable on an appropriate medical regimen Normal ICD function See Pace Art report No changes today he is not device dependant today followed in ICM device clinic  2. HTN Stable No change required  today  3. HL Continue statin  Risks, benefits and potential toxicities for medications prescribed and/or refilled reviewed with patient today.   Return in a year   Hillis Range MD, [facility] 06/15/2020 9:38 AM '

## 2020-06-15 NOTE — Patient Instructions (Signed)
Medication Instructions:   Your physician recommends that you continue on your current medications as directed. Please refer to the Current Medication list given to you today.  Labwork:  None  Testing/Procedures:  None  Follow-Up:  Your physician recommends that you schedule a follow-up appointment in: 1 year.  Any Other Special Instructions Will Be Listed Below (If Applicable).  If you need a refill on your cardiac medications before your next appointment, please call your pharmacy. 

## 2020-06-18 ENCOUNTER — Encounter (HOSPITAL_COMMUNITY): Payer: Self-pay | Admitting: Physical Therapy

## 2020-06-18 ENCOUNTER — Ambulatory Visit (HOSPITAL_COMMUNITY): Payer: Medicare Other

## 2020-06-18 ENCOUNTER — Other Ambulatory Visit: Payer: Self-pay

## 2020-06-18 ENCOUNTER — Encounter (HOSPITAL_COMMUNITY): Payer: Self-pay

## 2020-06-18 ENCOUNTER — Ambulatory Visit (HOSPITAL_COMMUNITY): Payer: Medicare Other | Admitting: Physical Therapy

## 2020-06-18 DIAGNOSIS — M6281 Muscle weakness (generalized): Secondary | ICD-10-CM

## 2020-06-18 DIAGNOSIS — R296 Repeated falls: Secondary | ICD-10-CM

## 2020-06-18 DIAGNOSIS — R278 Other lack of coordination: Secondary | ICD-10-CM

## 2020-06-18 DIAGNOSIS — R2689 Other abnormalities of gait and mobility: Secondary | ICD-10-CM

## 2020-06-18 DIAGNOSIS — R29898 Other symptoms and signs involving the musculoskeletal system: Secondary | ICD-10-CM

## 2020-06-18 NOTE — Therapy (Signed)
[facility] [address] [place], [place], [address] Phone: [phone]   Fax:  [phone]  Occupational Therapy Treatment  Patient Details  Name: Dalton Porter MRN: [id number] Date of Birth: [birth year] Referring Provider (OT): Kathlen Mody MD   Encounter Date: 06/18/2020   OT End of Session - 06/18/20 1141    Visit Number 5    Number of Visits 8    Date for OT Re-Evaluation 07/04/20    Authorization Type Medicare    Authorization Time Period 80/20 $25.00 due    Progress Note Due on Visit 10    OT Start Time 0945    OT Stop Time 1028    OT Time Calculation (min) 43 min    Activity Tolerance Patient tolerated treatment well    Behavior During Therapy [facility] for tasks assessed/performed           Past Medical History:  Diagnosis Date  . Back pain, chronic   . CHF (congestive heart failure) (HCC)   . Diabetes mellitus   . Nonischemic cardiomyopathy (HCC)    a. EF 40% by echo in 2003 b. 03/2016: cath showing normal cors and EF of 25-35%, EF 40-45% by echo with diffuse hypokinesis.  . Stroke [facility])     Past Surgical History:  Procedure Laterality Date  . ANKLE SURGERY    . APPENDECTOMY    . BACK SURGERY     x3  . CARDIAC CATHETERIZATION  2002  . CARDIAC CATHETERIZATION N/A 04/08/2016   Procedure: Left Heart Cath and Coronary Angiography;  Surgeon: Peter M Swaziland, MD;  Location: [facility] INVASIVE CV LAB;  Service: Cardiovascular;  Laterality: N/A;  . CHOLECYSTECTOMY    . ELBOW SURGERY    . ICD IMPLANT  01/26/2018   SJM Fortify Assura VR ICD implanted by Dr Marily Memos for primary prevention of sudden death  . THUMB FUSION      There were no vitals filed for this visit.   Subjective Assessment - 06/18/20 1001    Subjective  S: I'm noticing improvement.    Currently in Pain? No/denies              [facility] OT Assessment - 06/18/20 1002      Assessment   Medical Diagnosis left side weakness      Precautions   Precautions  Fall                    OT Treatments/Exercises (OP) - 06/18/20 1002      Exercises   Exercises Hand;Theraputty;Shoulder      Shoulder Exercises: Therapy Ball   Other Therapy Ball Exercises seated strengthening with green therapy ball. Chest press, circles (both directions), flexion, 12X      Hand Exercises   Other Hand Exercises Utilized red resistive clothespin in left hand and 3 point pinch to pick and transfer 25 sponges from table to container.      Fine Motor Coordination (Hand/Wrist)   Fine Motor Coordination Grooved pegs    Grooved pegs Utilized tweezers to place and remove pegs in and from pegboard focusing on fine motor coordination and functional movement of left hand with min-max difficulty noted.                    OT Short Term Goals - 06/12/20 1024      OT SHORT TERM GOAL #1   Title Patient will be educated and independent with HEP in order to increase functional use  of his LUE during all daily tasks demonstrating increased motor control and strength.    Time 4    Period Weeks    Status On-going    Target Date 07/04/20      OT SHORT TERM GOAL #2   Title Patient will increase his left grip strength by 15# and left pinch strength by 8# in order to hold onto and manipulate small object with greater stability and control without dropping.    Time 4    Period Weeks    Status On-going      OT SHORT TERM GOAL #3   Title Patient will increase left hand coordination by completing the 9 hole peg test in 45" or less in order to increase motor control and movement needed to complete self care tasks.    Time 4    Period Weeks    Status On-going      OT SHORT TERM GOAL #4   Title Patient will increase left shoulder/scapular stability while demonstrating increase motor control by completing box and blocks test while moving 20 or more blocks in 1 minute.    Time 4    Period Weeks    Status On-going                    Plan - 06/18/20 1141     Clinical Impression Statement A: Patient demonstrates improvement overall with hand strength and coordination when initially starting a task/activity. As activity time progresses, patient begins to experience increased left hand tremors which causes difficulty with any fine motor task. Pt takes frequent rest breaks when tremors increase although they do not seem to help. VC for form and technique were provided. Suggested placing elbow on table to stabilize shoulder although no resolve is noted. Tremor appears to be manifesting from hand and increase with fatigue.    Body Structure / Function / Physical Skills ADL;UE functional use;FMC;GMC;IADL;Strength    Plan P: Continue with grip strengthening and proximal shoulder stability exercises.    Consulted and Agree with Plan of Care Patient           Patient will benefit from skilled therapeutic intervention in order to improve the following deficits and impairments:   Body Structure / Function / Physical Skills: ADL,UE functional use,FMC,GMC,IADL,Strength       Visit Diagnosis: Other symptoms and signs involving the musculoskeletal system  Other lack of coordination    Problem List Patient Active Problem List   Diagnosis Date Noted  . Left-sided weakness 05/28/2020  . History of stroke 01/19/2019  . Nonischemic cardiomyopathy (HCC) 04/09/2016  . 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 Chest pain on exertion 01/15/2011  . HTN (hypertension) 01/15/2011   Limmie Patricia, OTR/L,CBIS  [phone]  06/18/2020, 11:45 AM  [facility] [address] [place], [place], [address] Phone: [phone]   Fax:  [phone]  Name: Dalton Porter MRN: [id number] Date of Birth: 1956/06/13

## 2020-06-18 NOTE — Therapy (Signed)
[facility] [address] [place], [place], [address] Phone: [phone]   Fax:  [phone]  Physical Therapy Treatment  Patient Details  Name: WENDEL HOMEYER MRN: [id number] Date of Birth: 19-Aug-1955 Referring Provider (PT): Kathlen Mody MD   Encounter Date: 06/18/2020   PT End of Session - 06/18/20 0825    Visit Number 2    Number of Visits 12    Date for PT Re-Evaluation 07/25/20    Authorization Type Medicare    Progress Note Due on Visit 10    PT Start Time 0827    PT Stop Time 0908    PT Time Calculation (min) 41 min    Activity Tolerance Patient tolerated treatment well    Behavior During Therapy [facility] for tasks assessed/performed           Past Medical History:  Diagnosis Date   Back pain, chronic    CHF (congestive heart failure) (HCC)    Diabetes mellitus    Nonischemic cardiomyopathy (HCC)    a. EF 40% by echo in 2003 b. 03/2016: cath showing normal cors and EF of 25-35%, EF 40-45% by echo with diffuse hypokinesis.   Stroke [facility])     Past Surgical History:  Procedure Laterality Date   ANKLE SURGERY     APPENDECTOMY     BACK SURGERY     x3   CARDIAC CATHETERIZATION  2002   CARDIAC CATHETERIZATION N/A 04/08/2016   Procedure: Left Heart Cath and Coronary Angiography;  Surgeon: Peter M Swaziland, MD;  Location: [facility] INVASIVE CV LAB;  Service: Cardiovascular;  Laterality: N/A;   CHOLECYSTECTOMY     ELBOW SURGERY     ICD IMPLANT  01/26/2018   SJM Fortify Assura VR ICD implanted by Dr Marily Memos for primary prevention of sudden death   THUMB FUSION      There were no vitals filed for this visit.   Subjective Assessment - 06/18/20 0825    Subjective Patient reports LLE feels weak and "like it's not there first thing in the morning". Patient denies any falls experienced in the past few weeks. Patient does not identify  any correlation between back pain and LLE weakness.    Currently in Pain? No/denies     Pain Score 0-No pain    Pain Onset More than a month ago              [facility] PT Assessment - 06/18/20 0001      Assessment   Medical Diagnosis left side weakness    Referring Provider (PT) Kathlen Mody MD    Onset Date/Surgical Date 05/28/20      Precautions   Precautions Fall                         [facility] Adult PT Treatment/Exercise - 06/18/20 0001      Ambulation/Gait   Ambulation/Gait Yes    Ambulation/Gait Assistance 4: Min guard    Ambulation Distance (Feet) 250 Feet    Assistive device None    Gait Pattern Decreased dorsiflexion - left;Decreased weight shift to left;Left circumduction    Gait Comments trial without kinesiotape and with for left dorsirlexion assist      Exercises   Exercises Ankle      Knee/Hip Exercises: Standing   Other Standing Knee Exercises sidestepping x 2 min 3 lbs weights    Other Standing Knee Exercises stair taps 2x63min with 3 lbs ankle weights  Knee/Hip Exercises: Seated   Long Arc Quad Strengthening;Both;10 reps;4 sets   3 lbs     Ankle Exercises: Standing   Heel Raises 10 reps;Both   2x10     Ankle Exercises: Seated   Toe Raise 10 reps;2 seconds   4x10 dorsiflexion 3 lbs                 PT Education - 06/18/20 0912    Education Details pt education in HEP additions and underlying cause for left dorsiflexor weakness impacting loading response.  Education on wearing kinesiotape and monitoring for signs of skin irritation    Person(s) Educated Patient    Methods Explanation    Comprehension Verbalized understanding;Returned demonstration            PT Short Term Goals - 06/13/20 1010      PT SHORT TERM GOAL #1   Title Patient will be independent with HEP in order to improve functional outcomes.    Time 3    Period Weeks    Status New    Target Date 07/04/20      PT SHORT TERM GOAL #2   Title Patient will report at least 25% improvement in symptoms for improved quality of life.    Time 3     Period Weeks    Status New    Target Date 07/04/20             PT Long Term Goals - 06/13/20 1010      PT LONG TERM GOAL #1   Title Patient will report at least 75% improvement in symptoms for improved quality of life.    Time 6    Period Weeks    Status New    Target Date 07/25/20      PT LONG TERM GOAL #2   Title Patient will be able to complete 5x STS in under 20 seconds in order to reduce the risk of falls.    Time 6    Period Weeks    Status New    Target Date 07/25/20      PT LONG TERM GOAL #3   Title Patient will score at least 15/24 on DGI in order to demonstrate decreased risk for falling.    Time 6    Period Weeks    Status New    Target Date 07/25/20                 Plan - 06/18/20 0844    Clinical Impression Statement Patient continues to exhibit generalized weakness LLE > RLE with dorsiflexor strength of 3+/5 and demonstrates slight circumduction of LLE during swing phase due to weakness.  Patient reports recent monofilament testing of his feet but is unaware of the outcomes of the assessment. Patient exhibits decreased foot clearance LLE during swing phase with RLE compensation via past-retracts to achieve foot clearance.  Patient educated on his pronounced left dorsiflexor weakness and POC focus to improve ankle strength to reduce risk for falls and improve gait mechanics.  Poor eccentric foot control noted at initial contact with "foot slap" appreciated.    Personal Factors and Comorbidities Age;Fitness;Past/Current Experience;Time since onset of injury/illness/exacerbation;Comorbidity 3+    Comorbidities DM, neuropathy, chronic back pain, HTN    Examination-Activity Limitations Locomotion Level;Transfers;Bathing;Caring for Others;Dressing;Lift;Stand;Stairs;Squat    Examination-Participation Restrictions Meal Prep;Church;Cleaning;Yard Work;Volunteer;Occupation;Shop;Community Activity    Rehab Potential Good    PT Frequency 2x / week    PT Duration 6  weeks    PT  Treatment/Interventions ADLs/Self Care Home Management;Cryotherapy;Electrical Stimulation;Aquatic Therapy;Moist Heat;Traction;Ultrasound;DME Instruction;Gait training;Stair training;Functional mobility training;Therapeutic activities;Therapeutic exercise;Balance training;Neuromuscular re-education;Patient/family education;Manual techniques;Dry needling;Energy conservation;Splinting;Spinal Manipulations;Joint Manipulations    PT Next Visit Plan f/u on RW/ SPC use, begin LE strengtheing and static and dynamic balance training. Continue with LE PRE and emphasis on dorsflexion strength    PT Home Exercise Plan 12/15 using AD with ambulation. Added sidestepping along counter, ankle plntarflexion/dorsiflexion    Consulted and Agree with Plan of Care Patient           Patient will benefit from skilled therapeutic intervention in order to improve the following deficits and impairments:  Abnormal gait,Difficulty walking,Decreased endurance,Decreased activity tolerance,Pain,Decreased balance,Improper body mechanics,Decreased mobility,Decreased strength,Impaired sensation  Visit Diagnosis: Other symptoms and signs involving the musculoskeletal system  Other lack of coordination  Muscle weakness (generalized)  Other abnormalities of gait and mobility  Repeated falls     Problem List Patient Active Problem List   Diagnosis Date Noted   Left-sided weakness 05/28/2020   History of stroke 01/19/2019   Nonischemic cardiomyopathy (HCC) 04/09/2016   Diabetes mellitus type 2 with neurological manifestations (HCC) 01/15/2011   Chest pain on exertion 01/15/2011   HTN (hypertension) 01/15/2011     9:19 AM, 06/18/20 M. Shary Decamp, PT, DPT Physical Therapist- [place] Office Number: [phone]  [facility] [address] [place], [place], [address] Phone: [phone]   Fax:  [phone]  Name: MANAS HICKLING MRN:  [id number] Date of Birth: 12-05-1955

## 2020-06-18 NOTE — Patient Instructions (Signed)
Access Code: [email or web address] URL: [email or web address] Date: 06/18/2020 Prepared by: Shary Decamp  Exercises Side Stepping with Counter Support - 1 x daily - 7 x weekly - 3 sets - 2 min hold Seated Heel Toe Raises - 7 x weekly Heel rises with counter support - 1 x daily - 7 x weekly - 2 sets - 10 reps

## 2020-06-19 ENCOUNTER — Telehealth: Payer: Self-pay | Admitting: Cardiology

## 2020-06-19 ENCOUNTER — Encounter: Payer: Self-pay | Admitting: Cardiology

## 2020-06-19 ENCOUNTER — Ambulatory Visit (INDEPENDENT_AMBULATORY_CARE_PROVIDER_SITE_OTHER): Payer: Medicare Other | Admitting: Cardiology

## 2020-06-19 VITALS — BP 118/72 | HR 76 | Ht 71.0 in | Wt 240.0 lb

## 2020-06-19 DIAGNOSIS — I5022 Chronic systolic (congestive) heart failure: Secondary | ICD-10-CM | POA: Diagnosis not present

## 2020-06-19 DIAGNOSIS — R0789 Other chest pain: Secondary | ICD-10-CM | POA: Diagnosis not present

## 2020-06-19 MED ORDER — ENTRESTO 49-51 MG PO TABS
1.0000 | ORAL_TABLET | Freq: Two times a day (BID) | ORAL | 3 refills | Status: DC
Start: 2020-06-19 — End: 2021-04-12

## 2020-06-19 MED ORDER — ENTRESTO 49-51 MG PO TABS: 1.0000 | ORAL_TABLET | Freq: Two times a day (BID) | ORAL | 3 refills | Status: DC

## 2020-06-19 NOTE — Progress Notes (Signed)
Clinical Summary [name] is a 64 y.o.male seen today for follow up of the following medical problems.   1. Chest pain - recent admit 03/2016 with chest pain.  - cath 03/2016 with normal coronaries.  - 03/2016 echo LVEF 40-45% - cardiac MRI 04/2016 without evidence of infiltrative or inflammatory process. LVEF by MRI 33%, diffuse hypokinesis.   - - denies any recent chest pain.   05/2019 coronary CTA: LAD,OM1,OM2 disease not significant by FFR.  - occasional chest pains at times, usually with extreme exertion.       2. Chronic systoilc HF - echo 03/2016 LVEF 40-45%, recent cath patent coronaries - medical therapy limited by soft bp's and Wenchebach heart block - from discharge notes concern about possible infiltratrive process. cardiac MRI 04/2016 without evidence of infiltrative or inflammatory process. LVEF by MRI 33%, diffuse hypokinesis.    - [facility] 30%, grade II diastolic dysfunction - ICD [facility] January 26, 2018 at [facility] in  [place]  04/2020 echo: LVEF 40-45% - breathing is doing ok. Occasional LE edema - home weights stable around 239-240 lbs.  - compliant with meds     3. OSA screen - +snoring, can some have apneic episodes. Occasional daytime somnolence. - has not been interested in sleep study  - has not had sleep study since last visti.   4. History of CVA - he is full dose ASA due to prior CVA.      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 Has had covid shot x2, pfizer. Getting 3rd shot today.   Past Medical History:  Diagnosis Date  . Back pain, chronic   . CHF (congestive heart failure) (HCC)   . Diabetes mellitus   . Nonischemic cardiomyopathy (HCC)    a. EF 40% by echo in 2003 b. 03/2016: cath showing normal cors and EF of 25-35%, EF 40-45% by echo with diffuse hypokinesis.  . Stroke (HCC)      Allergies  Allergen Reactions  . 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 Codeine Rash    headache     Current Outpatient Medications  Medication Sig Dispense Refill  . aspirin 325 MG tablet Take 325 mg by mouth daily.    . Cholecalciferol (VITAMIN D3) 5000 units CAPS Take 1 capsule by mouth daily.    . furosemide (LASIX) 20 MG tablet Take 2 tablets (40 mg total) by mouth daily. 270 tablet 0  . gabapentin (NEURONTIN) 300 MG capsule Take 300 mg by mouth 3 (three) times daily.    [name] glipiZIDE (GLUCOTROL) 5 MG tablet Take 5 mg by mouth 2 (two) times daily before a meal.    . metFORMIN (GLUCOPHAGE) 500 MG tablet Take 500 mg by mouth 2 (two) times daily with a meal.    . metoprolol succinate (TOPROL XL) 25 MG 24 hr tablet Take 1 tablet (25 mg total) by mouth 2 (two) times daily. 45 tablet 0  . nitroGLYCERIN (NITROSTAT) 0.4 MG SL tablet Place 1 tablet (0.4 mg total) under the tongue every 5 (five) minutes as needed for chest pain. 25 tablet 3  . sacubitril-valsartan (ENTRESTO) 24-26 MG Take 1 tablet by mouth 2 (two) times daily. 180 tablet 3  . spironolactone (ALDACTONE) 25 MG tablet Take 25 mg by mouth daily. Prescribed by [facility] and pt reports  on 11/30/2018 that he takes daily.    . TRAZODONE HCL PO Take 100 mg by mouth at bedtime.     . vitamin B-12 (CYANOCOBALAMIN) 500 MCG tablet Take 1 tablet (500 mcg total) by mouth daily. 30 tablet 1  . atorvastatin (LIPITOR) 20 MG tablet Take 1 tablet (20 mg total) by mouth daily. 90 tablet 3   No current facility-administered medications for this visit.     Past Surgical History:  Procedure Laterality Date  . ANKLE SURGERY    . APPENDECTOMY    . BACK SURGERY     x3  . CARDIAC CATHETERIZATION  2002  . CARDIAC CATHETERIZATION N/A 04/08/2016   Procedure: Left Heart Cath and Coronary Angiography;  Surgeon: Peter M Swaziland, MD;  Location: [facility] INVASIVE CV LAB;  Service: Cardiovascular;  Laterality: N/A;  . CHOLECYSTECTOMY    .  ELBOW SURGERY    . ICD IMPLANT  01/26/2018   SJM Fortify Assura VR ICD implanted by Dr Marily Memos for primary prevention of sudden death  . THUMB FUSION       Allergies  Allergen Reactions  . 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 Codeine Rash    headache      Family History  Problem Relation Age of Onset  . Stroke Other   . Pancreatic cancer Father   . Healthy Sister   . Other Brother        died in a fire  . Leukemia Maternal Grandmother   . Healthy Sister      Social History [name] reports that he has never smoked. He has never used smokeless tobacco. [name] reports no history of alcohol use.   Review of Systems CONSTITUTIONAL: No weight loss, fever, chills, weakness or fatigue.  HEENT: Eyes: No visual loss, blurred vision, double vision or yellow sclerae.No hearing loss, sneezing, congestion, runny nose or sore throat.  SKIN: No rash or itching.  CARDIOVASCULAR: per hpi RESPIRATORY: No shortness of breath, cough or sputum.  GASTROINTESTINAL: No anorexia, nausea, vomiting or diarrhea. No abdominal pain or blood.  GENITOURINARY: No burning on urination, no polyuria NEUROLOGICAL: No headache, dizziness, syncope, paralysis, ataxia, numbness or tingling in the extremities. No change in bowel or bladder control.  MUSCULOSKELETAL: No muscle, back pain, joint pain or stiffness.  LYMPHATICS: No enlarged nodes. No history of splenectomy.  PSYCHIATRIC: No history of depression or anxiety.  ENDOCRINOLOGIC: No reports of sweating, cold or heat intolerance. No polyuria or polydipsia.  [name]   Physical Examination Vitals:   06/19/20 1007  BP: 118/72  Pulse: 76  SpO2: 98%   Filed Weights   06/19/20 1007  Weight: 240 lb (108.9 kg)    Gen: resting comfortably, no acute distress HEENT: no scleral icterus, pupils equal round and reactive, no palptable cervical adenopathy,   CV: RRR, no m/r/g, no jvd Resp: Clear to auscultation bilaterally GI: abdomen is soft, non-tender, non-distended, normal bowel sounds, no hepatosplenomegaly MSK: extremities are warm, no edema.  Skin: warm, no rash Neuro:  no focal deficits Psych: appropriate affect   Diagnostic Studies 03/2016 cath  There is severe left ventricular systolic dysfunction.  LV end diastolic pressure is mildly elevated.  The left ventricular ejection fraction is 25-35% by visual estimate.  1. Normal coronary anatomy 2. Severe LV dysfunction- global 3. Mildly elevated LVEDP  Plan: optimize medical therapy for CHF.  03/2016 echo Study Conclusions  - Left ventricle: The cavity size was normal. There was mild focal basal hypertrophy of the septum. Systolic function was mildly to moderately reduced. The estimated ejection fraction was in the range of 40% to 45%. Diffuse hypokinesis. Doppler parameters are consistent with abnormal left ventricular relaxation (grade 1 diastolic dysfunction). - Mitral valve: There was mild regurgitation. - Left atrium: The atrium was mildly dilated. - Pulmonary arteries: Systolic pressure was mildly increased.   04/2016 Cardiac MRI IMPRESSION: 1. Moderately dilated left ventricle with normal wall thickness and moderately decreased systolic function (LVEF = 33%) with diffuse hypokinesis.  There is no late gadolinium enhancement.  There is no evidence for infiltrative or inflammatory myocardial disease. 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  2. Normal right ventricular size, thickness and systolic function (RVEF = 50%) with no wall motion abnormalities.  3. Moderately dilated left atrium (45 mm).  4. Normal size of the aortic root, ascending aorta and pulmonary artery.  5. Mild mitral and tricuspid regurgitation.  03/2016 Event monitor  Telemetry tracings show primarily sinus  rhythm with occasional first degree and second degree Mobitz I Aurora Mask) block, occasional PVCs  Reported symptoms correlate with sinus rhtyhm with occasional PVCs   11/2017 [facility] Z[facility]atch Wenchebach block noted. NSVT up to 19 beats.   [facility]2017 [facility] LVEF 30%    Assessment and Plan  1. Chronic systolic HF -no recent symptoms - we will increase his entersto to 49/51mg  bid, check bmet 2 weeks  2. Chest pain - fairly chronic symptoms, CTA last year without significant disease - continue to monitor.      Antoine Poche, M.D.

## 2020-06-19 NOTE — Telephone Encounter (Signed)
New message    Please send all his prescriptions to the [place] [facility] [place]

## 2020-06-19 NOTE — Patient Instructions (Signed)
Your physician recommends that you schedule a follow-up appointment in: 4 MONTHS WITH DR [facility]  Your physician has recommended you make the following change in your medication:   INCREASE ENTRESTO 49/51 MG TWICE DAILY   Your physician recommends that you return for lab work in: 2 WEEKS BMP  Thank you for choosing [facility]!!

## 2020-06-19 NOTE — Telephone Encounter (Signed)
Sent Entesto to [facility] as requested

## 2020-06-20 ENCOUNTER — Ambulatory Visit (HOSPITAL_COMMUNITY): Payer: Medicare Other | Admitting: Physical Therapy

## 2020-06-20 ENCOUNTER — Encounter (HOSPITAL_COMMUNITY): Payer: Self-pay | Admitting: Physical Therapy

## 2020-06-20 ENCOUNTER — Other Ambulatory Visit: Payer: Self-pay

## 2020-06-20 ENCOUNTER — Ambulatory Visit (HOSPITAL_COMMUNITY): Payer: Medicare Other | Admitting: Occupational Therapy

## 2020-06-20 ENCOUNTER — Encounter (HOSPITAL_COMMUNITY): Payer: Self-pay | Admitting: Occupational Therapy

## 2020-06-20 DIAGNOSIS — R278 Other lack of coordination: Secondary | ICD-10-CM

## 2020-06-20 DIAGNOSIS — R29898 Other symptoms and signs involving the musculoskeletal system: Secondary | ICD-10-CM

## 2020-06-20 DIAGNOSIS — R2689 Other abnormalities of gait and mobility: Secondary | ICD-10-CM

## 2020-06-20 DIAGNOSIS — M6281 Muscle weakness (generalized): Secondary | ICD-10-CM

## 2020-06-20 DIAGNOSIS — R296 Repeated falls: Secondary | ICD-10-CM

## 2020-06-20 NOTE — Therapy (Signed)
[facility] [address] [place], [place], [address] Phone: [phone]   Fax:  [phone]  Occupational Therapy Treatment  Patient Details  Name: Dalton Porter MRN: [id number] Date of Birth: 1956-01-28 Referring Provider (OT): Kathlen Mody MD   Encounter Date: 06/20/2020   OT End of Session - 06/20/20 1026    Visit Number 6    Number of Visits 8    Date for OT Re-Evaluation 07/04/20    Authorization Type Medicare    Authorization Time Period 80/20 $25.00 due    Progress Note Due on Visit 10    OT Start Time 0944    OT Stop Time 1027    OT Time Calculation (min) 43 min    Activity Tolerance Patient tolerated treatment well    Behavior During Therapy [facility] for tasks assessed/performed           Past Medical History:  Diagnosis Date  . Back pain, chronic   . CHF (congestive heart failure) (HCC)   . Diabetes mellitus   . Nonischemic cardiomyopathy (HCC)    a. EF 40% by echo in 2003 b. 03/2016: cath showing normal cors and EF of 25-35%, EF 40-45% by echo with diffuse hypokinesis.  . Stroke [facility])     Past Surgical History:  Procedure Laterality Date  . ANKLE SURGERY    . APPENDECTOMY    . BACK SURGERY     x3  . CARDIAC CATHETERIZATION  2002  . CARDIAC CATHETERIZATION N/A 04/08/2016   Procedure: Left Heart Cath and Coronary Angiography;  Surgeon: Peter M Swaziland, MD;  Location: [facility] INVASIVE CV LAB;  Service: Cardiovascular;  Laterality: N/A;  . CHOLECYSTECTOMY    . ELBOW SURGERY    . ICD IMPLANT  01/26/2018   SJM Fortify Assura VR ICD implanted by Dr Marily Memos for primary prevention of sudden death  . THUMB FUSION      There were no vitals filed for this visit.   Subjective Assessment - 06/20/20 0947    Subjective  S: I've been working on it.    Currently in Pain? No/denies              [facility] OT Assessment - 06/20/20 0947      Assessment   Medical Diagnosis left side weakness      Precautions   Precautions  Fall                    OT Treatments/Exercises (OP) - 06/20/20 0947      Exercises   Exercises Hand;Theraputty;Shoulder      Shoulder Exercises: Therapy Ball   Other Therapy Ball Exercises seated strengthening using 2# wrist weight with basketball. Chest press, flexion, circles (both directions), 12X each      Shoulder Exercises: ROM/Strengthening   Other ROM/Strengthening Exercises arms on fire: 2' total, 15" holds for 4 positions    Other ROM/Strengthening Exercises proximal shoulder strengthening: holding 2# weight at shoulder height, writing ABCs      Hand Exercises   Hand Gripper with Large Beads all beads gripper at 35# horizontal    Hand Gripper with Medium Beads all beads gripper at 35# horizontal    Other Hand Exercises Pt using pvc pipe to cut circles into red theraputty working on sustained grip      Theraputty   Theraputty - Flatten seated, red    Theraputty - Roll red    Theraputty - Pinch 3 point and lateral, red  Fine Motor Coordination (Hand/Wrist)   Fine Motor Coordination Small Pegboard    Small Pegboard Pt holding pegs in palm and working on palm to fingertip translation to bring to tip pinch and place into pegboard. Mod to max difficulty initially, improved to min/mod with practice. Increased time required to complete task.                    OT Short Term Goals - 06/12/20 1024      OT SHORT TERM GOAL #1   Title Patient will be educated and independent with HEP in order to increase functional use of his LUE during all daily tasks demonstrating increased motor control and strength.    Time 4    Period Weeks    Status On-going    Target Date 07/04/20      OT SHORT TERM GOAL #2   Title Patient will increase his left grip strength by 15# and left pinch strength by 8# in order to hold onto and manipulate small object with greater stability and control without dropping.    Time 4    Period Weeks    Status On-going      OT SHORT  TERM GOAL #3   Title Patient will increase left hand coordination by completing the 9 hole peg test in 45" or less in order to increase motor control and movement needed to complete self care tasks.    Time 4    Period Weeks    Status On-going      OT SHORT TERM GOAL #4   Title Patient will increase left shoulder/scapular stability while demonstrating increase motor control by completing box and blocks test while moving 20 or more blocks in 1 minute.    Time 4    Period Weeks    Status On-going                    Plan - 06/20/20 0950    Clinical Impression Statement A: Continued with fine motor coordination at beginning of session to limit fatigue impacting tasks. Pt requiring increased time for pegboard completion, minimal tremors during task. Added pvc pipe task using red theraputty for grip and continued with hand gripper strengthening as well. Pt requiring occasional rest breaks for fatigue during grip strengthening tasks. Completed proximal shoulder strengthening tasks at end of session adding arms on fire. Verbal cuing for form and technique.    Body Structure / Function / Physical Skills ADL;UE functional use;FMC;GMC;IADL;Strength    Plan P: Continue with fine motor coordination tasks, grip strengthening attempt to increase resistance for hand gripper    OT Home Exercise Plan eval: coordination tasks, red theraputty (hand strength)    Consulted and Agree with Plan of Care Patient           Patient will benefit from skilled therapeutic intervention in order to improve the following deficits and impairments:   Body Structure / Function / Physical Skills: ADL,UE functional use,FMC,GMC,IADL,Strength       Visit Diagnosis: Other symptoms and signs involving the musculoskeletal system  Other lack of coordination    Problem List Patient Active Problem List   Diagnosis Date Noted  . Left-sided weakness 05/28/2020  . History of stroke 01/19/2019  . Nonischemic  cardiomyopathy (HCC) 04/09/2016  . 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 Chest pain on exertion 01/15/2011  . HTN (hypertension) 01/15/2011    Ezra Sites, OTR/L  [phone] 06/20/2020, 10:29 AM  [facility] [address] [place], [place], [address] Phone: [phone]   Fax:  [phone]  Name: Dalton Porter MRN: [id number] Date of Birth: 1956/04/09

## 2020-06-20 NOTE — Therapy (Signed)
[facility] [address] [place], [place], [address] Phone: [phone]   Fax:  [phone]  Physical Therapy Treatment  Patient Details  Name: Dalton Porter MRN: [id number] Date of Birth: [birth year] Referring Provider (PT): Kathlen Mody MD   Encounter Date: 06/20/2020    PT End of Session - 06/20/20 1031    Visit Number 3    Number of Visits 12    Date for PT Re-Evaluation 07/25/20    Authorization Type Medicare    Progress Note Due on Visit 10    PT Start Time 1030    PT Stop Time 1110    PT Time Calculation (min) 40 min    Activity Tolerance Patient tolerated treatment well    Behavior During Therapy [facility] for tasks assessed/performed           Past Medical History:  Diagnosis Date  . Back pain, chronic   . CHF (congestive heart failure) (HCC)   . Diabetes mellitus   . Nonischemic cardiomyopathy (HCC)    a. EF 40% by echo in 2003 b. 03/2016: cath showing normal cors and EF of 25-35%, EF 40-45% by echo with diffuse hypokinesis.  . Stroke [facility])     Past Surgical History:  Procedure Laterality Date  . ANKLE SURGERY    . APPENDECTOMY    . BACK SURGERY     x3  . CARDIAC CATHETERIZATION  2002  . CARDIAC CATHETERIZATION N/A 04/08/2016   Procedure: Left Heart Cath and Coronary Angiography;  Surgeon: Peter M Swaziland, MD;  Location: [facility] INVASIVE CV LAB;  Service: Cardiovascular;  Laterality: N/A;  . CHOLECYSTECTOMY    . ELBOW SURGERY    . ICD IMPLANT  01/26/2018   SJM Fortify Assura VR ICD implanted by Dr Marily Memos for primary prevention of sudden death  . THUMB FUSION      There were no vitals filed for this visit.   Subjective Assessment - 06/20/20 1030    Subjective Patient states things are going well. Reports no new issues. Just "normal pain" in his back.    Currently in Pain? Yes    Pain Score 4     Pain Location Back    Pain Orientation Lower    Pain Descriptors / Indicators Sore    Pain Type Chronic pain    Pain  Onset More than a month ago                             [facility] Adult PT Treatment/Exercise - 06/20/20 1033      Knee/Hip Exercises: Standing   Heel Raises Both;2 sets;10 reps    Forward Step Up Both;2 sets;10 reps;Hand Hold: 1;Step Height: 4"    Gait Training 2 RT in clinic, with SPC adjusted for height    Other Standing Knee Exercises tandem stance 2 x 30" solid floor, intermittent HHA, NBOS on foam 3 x 60"    Other Standing Knee Exercises sidestepping in // bars 5 RT      Knee/Hip Exercises: Seated   Long Arc Quad Strengthening;Both;10 reps;4 sets    Other Seated Knee/Hip Exercises seated toe raise x 20    Sit to Sand 1 set;10 reps;with UE support                    PT Short Term Goals - 06/13/20 1010      PT SHORT TERM GOAL #1   Title Patient  will be independent with HEP in order to improve functional outcomes.    Time 3    Period Weeks    Status New    Target Date 07/04/20      PT SHORT TERM GOAL #2   Title Patient will report at least 25% improvement in symptoms for improved quality of life.    Time 3    Period Weeks    Status New    Target Date 07/04/20             PT Long Term Goals - 06/13/20 1010      PT LONG TERM GOAL #1   Title Patient will report at least 75% improvement in symptoms for improved quality of life.    Time 6    Period Weeks    Status New    Target Date 07/25/20      PT LONG TERM GOAL #2   Title Patient will be able to complete 5x STS in under 20 seconds in order to reduce the risk of falls.    Time 6    Period Weeks    Status New    Target Date 07/25/20      PT LONG TERM GOAL #3   Title Patient will score at least 15/24 on DGI in order to demonstrate decreased risk for falling.    Time 6    Period Weeks    Status New    Target Date 07/25/20                 Plan - 06/20/20 1147    Clinical Impression Statement Patient tolerated session well today. Progressed standing strength activities.  Added sit to stands and step ups for functional LE strength. Also initiated balance training. Patient did well with tandem stance and was able to support using intermittent HHA. Patient was challenged with balance during sidestepping in bars, and demos instability often posteriorly. Patient educated on purpose and function of all added exercise, and required verbal cues to encourage decreased UE support during sit to stands for improved leg strengthening. Patient will continue to benefit from skilled therapy services to progress LE strength to improve functional mobility and reduce risk for falls.    Personal Factors and Comorbidities Age;Fitness;Past/Current Experience;Time since onset of injury/illness/exacerbation;Comorbidity 3+    Comorbidities DM, neuropathy, chronic back pain, HTN    Examination-Activity Limitations Locomotion Level;Transfers;Bathing;Caring for Others;Dressing;Lift;Stand;Stairs;Squat    Examination-Participation Restrictions Meal Prep;Church;Cleaning;Yard Work;Volunteer;Occupation;Shop;Community Activity    Rehab Potential Good    PT Frequency 2x / week    PT Duration 6 weeks    PT Treatment/Interventions ADLs/Self Care Home Management;Cryotherapy;Electrical Stimulation;Aquatic Therapy;Moist Heat;Traction;Ultrasound;DME Instruction;Gait training;Stair training;Functional mobility training;Therapeutic activities;Therapeutic exercise;Balance training;Neuromuscular re-education;Patient/family education;Manual techniques;Dry needling;Energy conservation;Splinting;Spinal Manipulations;Joint Manipulations    PT Next Visit Plan Continue LE strengtheing and static and dynamic balance training. Continue with LE PRE and emphasis on dorsflexion strength    PT Home Exercise Plan 12/15 using AD with ambulation. Added sidestepping along counter, ankle plntarflexion/dorsiflexion    Consulted and Agree with Plan of Care Patient           Patient will benefit from skilled therapeutic  intervention in order to improve the following deficits and impairments:  Abnormal gait,Difficulty walking,Decreased endurance,Decreased activity tolerance,Pain,Decreased balance,Improper body mechanics,Decreased mobility,Decreased strength,Impaired sensation  Visit Diagnosis: Other symptoms and signs involving the musculoskeletal system  Other lack of coordination  Muscle weakness (generalized)  Other abnormalities of gait and mobility  Repeated falls     Problem List Patient  Active Problem List   Diagnosis Date Noted  . Left-sided weakness 05/28/2020  . History of stroke 01/19/2019  . Nonischemic cardiomyopathy (HCC) 04/09/2016  . 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 Chest pain on exertion 01/15/2011  . HTN (hypertension) 01/15/2011   12:24 PM, 06/20/20 Georges Lynch PT DPT  Physical Therapist with [place]  [facility]  [phone]   [facility] [address] [place], [place], [address] Phone: [phone]   Fax:  [phone]  Name: Dalton Porter MRN: [id number] Date of Birth: 12-23-1955

## 2020-06-25 ENCOUNTER — Encounter (HOSPITAL_COMMUNITY): Payer: Self-pay | Admitting: Occupational Therapy

## 2020-06-25 ENCOUNTER — Other Ambulatory Visit: Payer: Self-pay

## 2020-06-25 ENCOUNTER — Ambulatory Visit (HOSPITAL_COMMUNITY): Payer: Medicare Other | Admitting: Occupational Therapy

## 2020-06-25 DIAGNOSIS — R29898 Other symptoms and signs involving the musculoskeletal system: Secondary | ICD-10-CM

## 2020-06-25 DIAGNOSIS — R278 Other lack of coordination: Secondary | ICD-10-CM

## 2020-06-25 NOTE — Therapy (Signed)
[facility] [facility] [address] [place], [place], [address] Phone: [phone]   Fax:  [phone]  Occupational Therapy Treatment  Patient Details  Name: Dalton Porter MRN: [id number] Date of Birth: 05-09-1956 Referring Provider (OT): Kathlen Mody MD   Encounter Date: 06/25/2020   OT End of Session - 06/25/20 1200    Visit Number 7    Number of Visits 8    Date for OT Re-Evaluation 07/04/20    Authorization Type Medicare    Authorization Time Period 80/20 $25.00 due    Progress Note Due on Visit 10    OT Start Time 1116    OT Stop Time 1154    OT Time Calculation (min) 38 min    Activity Tolerance Patient tolerated treatment well    Behavior During Therapy [facility] for tasks assessed/performed           Past Medical History:  Diagnosis Date  . Back pain, chronic   . CHF (congestive heart failure) (HCC)   . Diabetes mellitus   . Nonischemic cardiomyopathy (HCC)    a. EF 40% by echo in 2003 b. 03/2016: cath showing normal cors and EF of 25-35%, EF 40-45% by echo with diffuse hypokinesis.  . Stroke [facility])     Past Surgical History:  Procedure Laterality Date  . ANKLE SURGERY    . APPENDECTOMY    . BACK SURGERY     x3  . CARDIAC CATHETERIZATION  2002  . CARDIAC CATHETERIZATION N/A 04/08/2016   Procedure: Left Heart Cath and Coronary Angiography;  Surgeon: Peter M Swaziland, MD;  Location: [facility] INVASIVE CV LAB;  Service: Cardiovascular;  Laterality: N/A;  . CHOLECYSTECTOMY    . ELBOW SURGERY    . ICD IMPLANT  01/26/2018   SJM Fortify Assura VR ICD implanted by Dr Marily Memos for primary prevention of sudden death  . THUMB FUSION      There were no vitals filed for this visit.   Subjective Assessment - 06/25/20 1118    Subjective  S: Nothing new happening to report.    Currently in Pain? No/denies              [facility] OT Assessment - 06/25/20 1115      Assessment   Medical Diagnosis left side weakness      Precautions    Precautions Fall                    OT Treatments/Exercises (OP) - 06/25/20 1118      Exercises   Exercises Hand;Theraputty;Shoulder      Hand Exercises   Hand Gripper with Medium Beads all beads gripper at 35# horizontal    Other Hand Exercises Pt completing table building activity this session, working on bilateral integration and left hand grip and pinch strength for holding and stabilizing materials as well as coordination for managing multiple materials at one time. Pt also using screwdriver and wrench for tightening and stabilizing table parts, increased time for coordination and placing screwdriver into the screw. Increased time for task completion      Fine Motor Coordination (Hand/Wrist)   Fine Motor Coordination Manipulating coins    Manipulating coins Pt holding coins in palm and working on palm to fingertip translation to drop into slotted container. Pt did not drop any coins today, min difficulty with task.                    OT Short Term Goals -  06/12/20 1024      OT SHORT TERM GOAL #1   Title Patient will be educated and independent with HEP in order to increase functional use of his LUE during all daily tasks demonstrating increased motor control and strength.    Time 4    Period Weeks    Status On-going    Target Date 07/04/20      OT SHORT TERM GOAL #2   Title Patient will increase his left grip strength by 15# and left pinch strength by 8# in order to hold onto and manipulate small object with greater stability and control without dropping.    Time 4    Period Weeks    Status On-going      OT SHORT TERM GOAL #3   Title Patient will increase left hand coordination by completing the 9 hole peg test in 45" or less in order to increase motor control and movement needed to complete self care tasks.    Time 4    Period Weeks    Status On-going      OT SHORT TERM GOAL #4   Title Patient will increase left shoulder/scapular stability while  demonstrating increase motor control by completing box and blocks test while moving 20 or more blocks in 1 minute.    Time 4    Period Weeks    Status On-going                    Plan - 06/25/20 1137    Clinical Impression Statement A: Continued with fine motor coordination and grip/pinch strengthening with table building activity. Pt requiring increased time however was able to use left hand for gripping and stabilizing with min difficulty. Notable improvement in coin manipulation today versus prior trials. Continued with grip strengthening, attempted to increase gripper resistance however was unable to complete so reduced back to 35#.    Body Structure / Function / Physical Skills ADL;UE functional use;FMC;GMC;IADL;Strength    Plan P: Reassessment    OT Home Exercise Plan eval: coordination tasks, red theraputty (hand strength)    Consulted and Agree with Plan of Care Patient           Patient will benefit from skilled therapeutic intervention in order to improve the following deficits and impairments:   Body Structure / Function / Physical Skills: ADL,UE functional use,FMC,GMC,IADL,Strength       Visit Diagnosis: Other symptoms and signs involving the musculoskeletal system  Other lack of coordination    Problem List Patient Active Problem List   Diagnosis Date Noted  . Left-sided weakness 05/28/2020  . History of stroke 01/19/2019  . Nonischemic cardiomyopathy (HCC) 04/09/2016  . 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 Chest pain on exertion 01/15/2011  . HTN (hypertension) 01/15/2011   Ezra Sites, OTR/L  [phone] 06/25/2020, 12:01 PM  [place] [facility] [place] [address] [place], [place], [address] Phone: [phone]   Fax:  [phone]  Name: TENOCH MCCLURE MRN: [id number] Date of Birth: 05-19-1956

## 2020-06-26 NOTE — Progress Notes (Signed)
Remote ICD transmission.   

## 2020-06-27 ENCOUNTER — Encounter (HOSPITAL_COMMUNITY): Payer: Self-pay | Admitting: Occupational Therapy

## 2020-06-27 ENCOUNTER — Other Ambulatory Visit: Payer: Self-pay

## 2020-06-27 ENCOUNTER — Ambulatory Visit (HOSPITAL_COMMUNITY): Payer: Medicare Other | Admitting: Occupational Therapy

## 2020-06-27 ENCOUNTER — Ambulatory Visit (HOSPITAL_COMMUNITY): Payer: Medicare Other | Admitting: Physical Therapy

## 2020-06-27 ENCOUNTER — Encounter (HOSPITAL_COMMUNITY): Payer: Self-pay | Admitting: Physical Therapy

## 2020-06-27 DIAGNOSIS — R278 Other lack of coordination: Secondary | ICD-10-CM

## 2020-06-27 DIAGNOSIS — M6281 Muscle weakness (generalized): Secondary | ICD-10-CM

## 2020-06-27 DIAGNOSIS — R2689 Other abnormalities of gait and mobility: Secondary | ICD-10-CM

## 2020-06-27 DIAGNOSIS — R29898 Other symptoms and signs involving the musculoskeletal system: Secondary | ICD-10-CM | POA: Diagnosis not present

## 2020-06-27 DIAGNOSIS — R296 Repeated falls: Secondary | ICD-10-CM

## 2020-06-27 NOTE — Patient Instructions (Signed)
Therapy Ball Strengthening Exercises: Complete all exercises 10-15X each, 2x/day.   1) Overhead press: Hold a medicine ball or other ball at your chest. Next, extend your elbows and push the ball over your head as shown. Return to starting position and repeat.     2) Chest press: Hold a medicine ball or other ball at your chest. Next, extend your elbows and push the ball outward in front of your body as shown. Return to starting position and repeat.     3) Ball circles:  Hold a medicine ball or other ball at your chest. Next, extend your elbows and push the ball outward in front of your body as shown. Then, move the ball in a circular pattern for several revolutions and then reverse the direction.     4) Flexion: Start holding an exercise ball out in front of your body with elbows extended. Then, slowly lift the ball up over head. Return the ball to starting position and repeat.        

## 2020-06-27 NOTE — Therapy (Signed)
[place], [place], [address] Phone: [phone]   Fax:  [phone]  Occupational Therapy Reassessment, Treatment, Discharge Summary   Patient Details  Name: Dalton Porter MRN: [id number] Date of Birth: [birth year] Referring Provider (OT): Hosie Poisson MD   Encounter Date: 06/27/2020   OT End of Session - 06/27/20 1028    Visit Number 8    Number of Visits 8    Date for OT Re-Evaluation 07/04/20    Authorization Type Medicare    Authorization Time Period 80/20 $25.00 due    Progress Note Due on Visit 10    OT Start Time [phone]    OT Stop Time 1024    OT Time Calculation (min) 35 min    Activity Tolerance Patient tolerated treatment well    Behavior During Therapy [facility] for tasks assessed/performed           Past Medical History:  Diagnosis Date  . Back pain, chronic   . CHF (congestive heart failure) ([place])   . Diabetes mellitus   . Nonischemic cardiomyopathy ([place])    a. EF 40% by echo in 2003 b. 03/2016: cath showing normal cors and EF of 25-35%, EF 40-45% by echo with diffuse hypokinesis.  . Stroke [facility])     Past Surgical History:  Procedure Laterality Date  . ANKLE SURGERY    . APPENDECTOMY    . BACK SURGERY     x3  . CARDIAC CATHETERIZATION  2002  . CARDIAC CATHETERIZATION N/A 04/08/2016   Procedure: Left Heart Cath and Coronary Angiography;  Surgeon: Peter M Martinique, MD;  Location: [place] CV LAB;  Service: Cardiovascular;  Laterality: N/A;  . CHOLECYSTECTOMY    . ELBOW SURGERY    . ICD IMPLANT  01/26/2018   SJM Fortify Assura VR ICD implanted by Dr [name] for primary prevention of sudden death  . THUMB FUSION      There were no vitals filed for this visit.   Subjective Assessment - 06/27/20 0950    Subjective  S: It's mostly the tremors that bother me now.    Currently in Pain? No/denies              [facility] OT Assessment - 06/27/20 0950      Assessment   Medical Diagnosis  left side weakness      Precautions   Precautions Fall      Coordination   Left 9 Hole Peg Test 46.05"   1'02" previous   Other box and blocks: 40 blocks in 1'      Strength   Left Hand Grip (lbs) 65   40 previous   Left Hand Lateral Pinch 10 lbs   8 previous   Left Hand 3 Point Pinch 9 lbs   8 previous                   OT Treatments/Exercises (OP) - 06/27/20 0956      Exercises   Exercises Hand;Theraputty;Shoulder      Fine Motor Coordination (Hand/Wrist)   Fine Motor Coordination Small Pegboard    Small Pegboard Pt using tweezers to grasp the top of small pegs and place into pegboard around the border.                  OT Education - 06/27/20 1027    Education Details therapy ball shoulder strengthening, hand strengthening, fine motor coordination handout    Person(s) Educated Patient  Methods Explanation;Demonstration;Handout    Comprehension Returned demonstration;Verbalized understanding            OT Short Term Goals - 06/27/20 0958      OT SHORT TERM GOAL #1   Title Patient will be educated and independent with HEP in order to increase functional use of his LUE during all daily tasks demonstrating increased motor control and strength.    Time 4    Period Weeks    Status Achieved    Target Date 07/04/20      OT SHORT TERM GOAL #2   Title Patient will increase his left grip strength by 15# and left pinch strength by 8# in order to hold onto and manipulate small object with greater stability and control without dropping.    Baseline 12/29: grip increased by 15#, lateral pinch by 2# and 3 point by 1#    Time 4    Period Weeks    Status Partially Met      OT SHORT TERM GOAL #3   Title Patient will increase left hand coordination by completing the 9 hole peg test in 45" or less in order to increase motor control and movement needed to complete self care tasks.    Time 4    Period Weeks    Status Achieved      OT SHORT TERM GOAL #4    Title Patient will increase left shoulder/scapular stability while demonstrating increase motor control by completing box and blocks test while moving 20 or more blocks in 1 minute.    Time 4    Period Weeks    Status Achieved                    Plan - 06/27/20 1028    Clinical Impression Statement A: Reassessment completed this session, pt has met 3/4 goals with remaining goal partially met. Pt has improved shoulder stability, grip and pinch strength, and fine motor coordination throughout course of therapy. Pt with intention tremors that limit success with some fine motor tasks, worsening with fatigue.Pt reports he is able to complete most ADLs without difficulty, being right hand dominant. 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 Discussed work tasks and problem-solved for scenarios, recommended pt practice skills required for work at home, using plastic tools first and working up to regular tools as he feels able and confident. Pt is agreeable to discharge today. Continuing to wait on [place] for neurology consultation.    Body Structure / Function / Physical Skills ADL;UE functional use;FMC;GMC;IADL;Strength    Plan P: Discharge pt    OT Home Exercise Plan eval: coordination tasks, red theraputty (hand strength); 12/29: hand strengthening, fine motor coordination, therapy ball shoulder strengthening    Consulted and Agree with Plan of Care Patient           Patient will benefit from skilled therapeutic intervention in order to improve the following deficits and impairments:   Body Structure / Function / Physical Skills: ADL,UE functional use,FMC,GMC,IADL,Strength       Visit Diagnosis: Other symptoms and signs involving the musculoskeletal system  Other lack of coordination    Problem List Patient Active Problem List   Diagnosis Date Noted  . Left-sided weakness 05/28/2020  . History of stroke 01/19/2019  .  Nonischemic cardiomyopathy ([place]) 04/09/2016  . Diabetes mellitus type 2 with neurological manifestations ([place]) 01/15/2011  . Chest pain on exertion 01/15/2011  . HTN (  hypertension) 01/15/2011   Guadelupe Sabin, OTR/L  [phone] 06/27/2020, 10:33 AM  [place], [place], [address] Phone: [phone]   Fax:  [phone]  Name: Dalton Porter MRN: [id number] Date of Birth: 09-Jun-1956    OCCUPATIONAL THERAPY DISCHARGE SUMMARY  Visits from Start of Care: 8  Current functional level related to goals / functional outcomes: See above. Pt has met 3/4 goals, remaining goal partially met. Pt has improved shoulder stability, grip and pinch strength, and fine motor coordination. Reports improvement in ADL completion at home.    Remaining deficits: Intention tremors affecting fine motor coordination and stability, decreased pinch strength,    Education / Equipment: HEP for hand strengthening, fine motor coordination, and shoulder therapy ball strengthening Plan: Patient agrees to discharge.  Patient goals were met. Patient is being discharged due to meeting the stated rehab goals.  ?????

## 2020-06-27 NOTE — Therapy (Signed)
[facility] [address] [place], [place], [address] Phone: [phone]   Fax:  [phone]  Physical Therapy Treatment  Patient Details  Name: Dalton Porter MRN: [id number] Date of Birth: 01/19/1956 Referring Provider (PT): Kathlen Mody MD   Encounter Date: 06/27/2020   PT End of Session - 06/27/20 0828    Visit Number 4    Number of Visits 12    Date for PT Re-Evaluation 07/25/20    Authorization Type Medicare    Progress Note Due on Visit 10    PT Start Time 0830    PT Stop Time 0910    PT Time Calculation (min) 40 min    Activity Tolerance Patient tolerated treatment well    Behavior During Therapy [facility] for tasks assessed/performed           Past Medical History:  Diagnosis Date  . Back pain, chronic   . CHF (congestive heart failure) (HCC)   . Diabetes mellitus   . Nonischemic cardiomyopathy (HCC)    a. EF 40% by echo in 2003 b. 03/2016: cath showing normal cors and EF of 25-35%, EF 40-45% by echo with diffuse hypokinesis.  . Stroke [facility])     Past Surgical History:  Procedure Laterality Date  . ANKLE SURGERY    . APPENDECTOMY    . BACK SURGERY     x3  . CARDIAC CATHETERIZATION  2002  . CARDIAC CATHETERIZATION N/A 04/08/2016   Procedure: Left Heart Cath and Coronary Angiography;  Surgeon: Peter M Swaziland, MD;  Location: [facility] INVASIVE CV LAB;  Service: Cardiovascular;  Laterality: N/A;  . CHOLECYSTECTOMY    . ELBOW SURGERY    . ICD IMPLANT  01/26/2018   SJM Fortify Assura VR ICD implanted by Dr Marily Memos for primary prevention of sudden death  . THUMB FUSION      There were no vitals filed for this visit.   Subjective Assessment - 06/27/20 0830    Subjective No new falls, exercises are going alright. States the [facility] has been working good and he feels more stable.    Currently in Pain? No/denies    Pain Onset More than a month ago                             [facility] Adult PT Treatment/Exercise -  06/27/20 0001      Knee/Hip Exercises: Standing   Heel Raises Both;15 reps;1 set    Knee Flexion Both;1 set;15 reps    Hip Flexion Both;1 set;10 reps    Hip Flexion Limitations alternating marches    Hip Abduction Both;2 sets;10 reps    Lateral Step Up Both;1 set;10 reps;Hand Hold: 1;Step Height: 4"    Forward Step Up Both;2 sets;10 reps;Hand Hold: 1;Step Height: 4"    Other Standing Knee Exercises tandem stance 2 x 30" solid floor, intermittent HHA,    Other Standing Knee Exercises sidestepping in // bars 5 RT      Knee/Hip Exercises: Seated   Other Seated Knee/Hip Exercises ankle DF isometric 10x 5 second holds                  PT Education - 06/27/20 0829    Education Details Patient educated on HEP, exercise mechanics    Person(s) Educated Patient    Methods Explanation;Demonstration    Comprehension Verbalized understanding;Returned demonstration            PT Short Term  Goals - 06/13/20 1010      PT SHORT TERM GOAL #1   Title Patient will be independent with HEP in order to improve functional outcomes.    Time 3    Period Weeks    Status New    Target Date 07/04/20      PT SHORT TERM GOAL #2   Title Patient will report at least 25% improvement in symptoms for improved quality of life.    Time 3    Period Weeks    Status New    Target Date 07/04/20             PT Long Term Goals - 06/13/20 1010      PT LONG TERM GOAL #1   Title Patient will report at least 75% improvement in symptoms for improved quality of life.    Time 6    Period Weeks    Status New    Target Date 07/25/20      PT LONG TERM GOAL #2   Title Patient will be able to complete 5x STS in under 20 seconds in order to reduce the risk of falls.    Time 6    Period Weeks    Status New    Target Date 07/25/20      PT LONG TERM GOAL #3   Title Patient will score at least 15/24 on DGI in order to demonstrate decreased risk for falling.    Time 6    Period Weeks    Status New     Target Date 07/25/20                 Plan - 06/27/20 8099    Clinical Impression Statement Patient demonstrates very limited ankle excursion with heel raises secondary to impaired bilateral plantarflexion strength. Patient stated K tape was very helpful for DF when applied several sessions ago, applied K tape to anterior tib today and educated patient on how to use and where to purchase. Patient requires unilateral UE support with most standing exercises today secondary to impaired balance. Patient with mod sway with tandem balance and requires frequent HHA to regain balance. Patient unsteady occasionally with transition to standing with STS exercise requiring UE support to regain balance. Patient will continue to benefit from skilled physical therapy in order to reduce impairment and improve function.    Personal Factors and Comorbidities Age;Fitness;Past/Current Experience;Time since onset of injury/illness/exacerbation;Comorbidity 3+    Comorbidities DM, neuropathy, chronic back pain, HTN    Examination-Activity Limitations Locomotion Level;Transfers;Bathing;Caring for Others;Dressing;Lift;Stand;Stairs;Squat    Examination-Participation Restrictions Meal Prep;Church;Cleaning;Yard Work;Volunteer;Occupation;Shop;Community Activity    Rehab Potential Good    PT Frequency 2x / week    PT Duration 6 weeks    PT Treatment/Interventions ADLs/Self Care Home Management;Cryotherapy;Electrical Stimulation;Aquatic Therapy;Moist Heat;Traction;Ultrasound;DME Instruction;Gait training;Stair training;Functional mobility training;Therapeutic activities;Therapeutic exercise;Balance training;Neuromuscular re-education;Patient/family education;Manual techniques;Dry needling;Energy conservation;Splinting;Spinal Manipulations;Joint Manipulations    PT Next Visit Plan Continue LE strengtheing and static and dynamic balance training. Continue with LE PRE and emphasis on dorsflexion strength    PT Home Exercise Plan  12/15 using AD with ambulation. Added sidestepping along counter, ankle plntarflexion/dorsiflexion 12/29 STS, hip abd, DF isometric    Consulted and Agree with Plan of Care Patient           Patient will benefit from skilled therapeutic intervention in order to improve the following deficits and impairments:  Abnormal gait,Difficulty walking,Decreased endurance,Decreased activity tolerance,Pain,Decreased balance,Improper body mechanics,Decreased mobility,Decreased strength,Impaired sensation  Visit Diagnosis: Other symptoms and  signs involving the musculoskeletal system  Muscle weakness (generalized)  Other abnormalities of gait and mobility  Repeated falls     Problem List Patient Active Problem List   Diagnosis Date Noted  . Left-sided weakness 05/28/2020  . History of stroke 01/19/2019  . Nonischemic cardiomyopathy (HCC) 04/09/2016  . 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 Chest pain on exertion 01/15/2011  . HTN (hypertension) 01/15/2011    9:13 AM, 06/27/20 Wyman Songster PT, DPT Physical Therapist at [facility]   [place] [facility] [address] [place], [place], [address] Phone: [phone]   Fax:  [phone]  Name: Dalton Porter MRN: [id number] Date of Birth: [birth year]

## 2020-06-27 NOTE — Patient Instructions (Signed)
Access Code: [email or web address] URL: [email or web address] Date: 06/27/2020 Prepared by: Greig Castilla Jacquese Hackman  Exercises Seated Toe Raise - 1 x daily - 7 x weekly - 10 reps - 5 second hold Standing Hip Abduction with Counter Support - 1 x daily - 7 x weekly - 2 sets - 10 reps Sit to Stand with Arms Crossed - 3 x daily - 7 x weekly - 5 reps

## 2020-07-03 ENCOUNTER — Ambulatory Visit (HOSPITAL_COMMUNITY): Payer: Medicare Other | Attending: Internal Medicine | Admitting: Physical Therapy

## 2020-07-03 ENCOUNTER — Other Ambulatory Visit: Payer: Self-pay

## 2020-07-03 ENCOUNTER — Encounter (HOSPITAL_COMMUNITY): Payer: Self-pay | Admitting: Physical Therapy

## 2020-07-03 DIAGNOSIS — R2689 Other abnormalities of gait and mobility: Secondary | ICD-10-CM | POA: Insufficient documentation

## 2020-07-03 DIAGNOSIS — R278 Other lack of coordination: Secondary | ICD-10-CM | POA: Diagnosis present

## 2020-07-03 DIAGNOSIS — R29898 Other symptoms and signs involving the musculoskeletal system: Secondary | ICD-10-CM | POA: Insufficient documentation

## 2020-07-03 DIAGNOSIS — M6281 Muscle weakness (generalized): Secondary | ICD-10-CM | POA: Diagnosis present

## 2020-07-03 DIAGNOSIS — R296 Repeated falls: Secondary | ICD-10-CM | POA: Diagnosis present

## 2020-07-03 NOTE — Therapy (Signed)
[facility] [address] [place], [place], [address] Phone: [phone]   Fax:  [phone]  Physical Therapy Treatment  Patient Details  Name: Dalton Porter MRN: [id number] Date of Birth: [birth year] Referring Provider (PT): Kathlen Mody MD   Encounter Date: 07/03/2020   PT End of Session - 07/03/20 0915    Visit Number 5    Number of Visits 12    Date for PT Re-Evaluation 07/25/20    Authorization Type Medicare    Progress Note Due on Visit 10    PT Start Time 0915    PT Stop Time 0955    PT Time Calculation (min) 40 min    Activity Tolerance Patient tolerated treatment well    Behavior During Therapy [facility] for tasks assessed/performed           Past Medical History:  Diagnosis Date  . Back pain, chronic   . CHF (congestive heart failure) (HCC)   . Diabetes mellitus   . Nonischemic cardiomyopathy (HCC)    a. EF 40% by echo in 2003 b. 03/2016: cath showing normal cors and EF of 25-35%, EF 40-45% by echo with diffuse hypokinesis.  . Stroke [facility])     Past Surgical History:  Procedure Laterality Date  . ANKLE SURGERY    . APPENDECTOMY    . BACK SURGERY     x3  . CARDIAC CATHETERIZATION  2002  . CARDIAC CATHETERIZATION N/A 04/08/2016   Procedure: Left Heart Cath and Coronary Angiography;  Surgeon: Peter M Swaziland, MD;  Location: [facility] INVASIVE CV LAB;  Service: Cardiovascular;  Laterality: N/A;  . CHOLECYSTECTOMY    . ELBOW SURGERY    . ICD IMPLANT  01/26/2018   SJM Fortify Assura VR ICD implanted by Dr Marily Memos for primary prevention of sudden death  . THUMB FUSION      There were no vitals filed for this visit.   Subjective Assessment - 07/03/20 0914    Subjective Patient states he has been feeling good. He got some tape for his leg. He feels like it helping pick his toes up. He has not caught foot as much. No falls. He was really tired after last session.    Currently in Pain? Yes    Pain Score 4     Pain Location Back     Pain Orientation Lower    Pain Onset More than a month ago                             [facility] Adult PT Treatment/Exercise - 07/03/20 0001      Knee/Hip Exercises: Standing   Heel Raises Both;15 reps;1 set    Knee Flexion Both;1 set;15 reps    Knee Flexion Limitations 2#    Hip Flexion Both;1 set;15 reps    Hip Flexion Limitations alternating marches 2#    Hip Abduction Both;2 sets;10 reps    Abduction Limitations 2#    Lateral Step Up Both;1 set;10 reps;Hand Hold: 1;Step Height: 6"    Forward Step Up Both;2 sets;10 reps;Hand Hold: 1;Step Height: 6"    Gait Training 226 with SPC    Other Standing Knee Exercises 6 inch hurdles 8RT                  PT Education - 07/03/20 0915    Education Details Patient educated on HEP, exercise mechanics    Person(s) Educated Patient    Methods  Explanation;Demonstration    Comprehension Verbalized understanding;Returned demonstration            PT Short Term Goals - 06/13/20 1010      PT SHORT TERM GOAL #1   Title Patient will be independent with HEP in order to improve functional outcomes.    Time 3    Period Weeks    Status New    Target Date 07/04/20      PT SHORT TERM GOAL #2   Title Patient will report at least 25% improvement in symptoms for improved quality of life.    Time 3    Period Weeks    Status New    Target Date 07/04/20             PT Long Term Goals - 06/13/20 1010      PT LONG TERM GOAL #1   Title Patient will report at least 75% improvement in symptoms for improved quality of life.    Time 6    Period Weeks    Status New    Target Date 07/25/20      PT LONG TERM GOAL #2   Title Patient will be able to complete 5x STS in under 20 seconds in order to reduce the risk of falls.    Time 6    Period Weeks    Status New    Target Date 07/25/20      PT LONG TERM GOAL #3   Title Patient will score at least 15/24 on DGI in order to demonstrate decreased risk for falling.     Time 6    Period Weeks    Status New    Target Date 07/25/20                 Plan - 07/03/20 0915    Clinical Impression Statement Patient continues to show impaired ankle PF strength with heel raises. He is able to complete standing exercises with added resistance without c/o difficulty. Patient able to complete step up to increased height today with good mechanics. Patient educated on beginning a walking program and how to progress safely to build endurance and confidence. Patient minimally unsteady with hurdles when looking at hurdles. Unsteadiness increases without looking requiring min/mod assist for balance intermittently. Patient will continue to benefit from skilled physical therapy in order to reduce impairment and improve function.    Personal Factors and Comorbidities Age;Fitness;Past/Current Experience;Time since onset of injury/illness/exacerbation;Comorbidity 3+    Comorbidities DM, neuropathy, chronic back pain, HTN    Examination-Activity Limitations Locomotion Level;Transfers;Bathing;Caring for Others;Dressing;Lift;Stand;Stairs;Squat    Examination-Participation Restrictions Meal Prep;Church;Cleaning;Yard Work;Volunteer;Occupation;Shop;Community Activity    Rehab Potential Good    PT Frequency 2x / week    PT Duration 6 weeks    PT Treatment/Interventions ADLs/Self Care Home Management;Cryotherapy;Electrical Stimulation;Aquatic Therapy;Moist Heat;Traction;Ultrasound;DME Instruction;Gait training;Stair training;Functional mobility training;Therapeutic activities;Therapeutic exercise;Balance training;Neuromuscular re-education;Patient/family education;Manual techniques;Dry needling;Energy conservation;Splinting;Spinal Manipulations;Joint Manipulations    PT Next Visit Plan Continue LE strengtheing and static and dynamic balance training. Continue with LE PRE and emphasis on dorsflexion strength    PT Home Exercise Plan 12/15 using AD with ambulation. Added sidestepping along  counter, ankle plntarflexion/dorsiflexion 12/29 STS, hip abd, DF isometric 1/4 walking program    Consulted and Agree with Plan of Care Patient           Patient will benefit from skilled therapeutic intervention in order to improve the following deficits and impairments:  Abnormal gait,Difficulty walking,Decreased endurance,Decreased activity tolerance,Pain,Decreased balance,Improper body mechanics,Decreased mobility,Decreased strength,Impaired  sensation  Visit Diagnosis: Other symptoms and signs involving the musculoskeletal system  Muscle weakness (generalized)  Other abnormalities of gait and mobility  Repeated falls     Problem List Patient Active Problem List   Diagnosis Date Noted  . Left-sided weakness 05/28/2020  . History of stroke 01/19/2019  . Nonischemic cardiomyopathy ([place]) 04/09/2016  . Diabetes mellitus type 2 with neurological manifestations ([place]) 01/15/2011  . Chest pain on exertion 01/15/2011  . HTN (hypertension) 01/15/2011   10:01 AM, 07/03/20 Mearl Latin PT, DPT Physical Therapist at [place], [place], [address] Phone: [phone]   Fax:  [phone]  Name: Dalton Porter MRN: [id number] Date of Birth: [birth year]

## 2020-07-05 ENCOUNTER — Other Ambulatory Visit: Payer: Self-pay

## 2020-07-05 ENCOUNTER — Ambulatory Visit (HOSPITAL_COMMUNITY): Payer: Medicare Other | Admitting: Physical Therapy

## 2020-07-05 DIAGNOSIS — R29898 Other symptoms and signs involving the musculoskeletal system: Secondary | ICD-10-CM | POA: Diagnosis not present

## 2020-07-05 DIAGNOSIS — R278 Other lack of coordination: Secondary | ICD-10-CM

## 2020-07-05 DIAGNOSIS — R2689 Other abnormalities of gait and mobility: Secondary | ICD-10-CM

## 2020-07-05 DIAGNOSIS — R296 Repeated falls: Secondary | ICD-10-CM

## 2020-07-05 DIAGNOSIS — M6281 Muscle weakness (generalized): Secondary | ICD-10-CM

## 2020-07-05 NOTE — Therapy (Signed)
[facility] [address] [place], [place], [address] Phone: [phone]   Fax:  [phone]  Physical Therapy Treatment  Patient Details  Name: Dalton Porter MRN: [id number] Date of Birth: 1956/03/06 Referring Provider (PT): Kathlen Mody MD   Encounter Date: 07/05/2020   PT End of Session - 07/05/20 1006    Visit Number 6    Number of Visits 12    Date for PT Re-Evaluation 07/25/20    Authorization Type Medicare    Progress Note Due on Visit 10    PT Start Time 0922    PT Stop Time 1002    PT Time Calculation (min) 40 min    Activity Tolerance Patient tolerated treatment well    Behavior During Therapy [facility] for tasks assessed/performed           Past Medical History:  Diagnosis Date  . Back pain, chronic   . CHF (congestive heart failure) (HCC)   . Diabetes mellitus   . Nonischemic cardiomyopathy (HCC)    a. EF 40% by echo in 2003 b. 03/2016: cath showing normal cors and EF of 25-35%, EF 40-45% by echo with diffuse hypokinesis.  . Stroke [facility])     Past Surgical History:  Procedure Laterality Date  . ANKLE SURGERY    . APPENDECTOMY    . BACK SURGERY     x3  . CARDIAC CATHETERIZATION  2002  . CARDIAC CATHETERIZATION N/A 04/08/2016   Procedure: Left Heart Cath and Coronary Angiography;  Surgeon: Peter M Swaziland, MD;  Location: [facility] INVASIVE CV LAB;  Service: Cardiovascular;  Laterality: N/A;  . CHOLECYSTECTOMY    . ELBOW SURGERY    . ICD IMPLANT  01/26/2018   SJM Fortify Assura VR ICD implanted by Dr Marily Memos for primary prevention of sudden death  . THUMB FUSION      There were no vitals filed for this visit.   Subjective Assessment - 07/05/20 0930    Subjective pt states his LB always hurts (4/10).  Reports he is able to apply the kinesiotape to his Lt AT to help with his weak dorsiflexor.  States exercises at home are going well.    Currently in Pain? Yes    Pain Score 4     Pain Location Back    Pain Orientation  Lower    Pain Descriptors / Indicators Sore                             [facility] Adult PT Treatment/Exercise - 07/05/20 0001      Knee/Hip Exercises: Standing   Heel Raises Both;1 set;20 reps    Heel Raises Limitations toeraises 20X    Knee Flexion Both;1 set;20 reps    Knee Flexion Limitations 3#    Hip Flexion Both;1 set;20 reps    Hip Flexion Limitations alternating marches 3#    Hip Abduction Both;20 reps    Abduction Limitations 3#    Hip Extension Both;20 reps    Extension Limitations 3#    SLS Max of 4" Rt, 3" Lt x5 trials    SLS with Vectors 5X5" each LE with 1 HHA    Other Standing Knee Exercises 2 6 and 2 12 inch hurdles 3RT                    PT Short Term Goals - 06/13/20 1010      PT SHORT TERM GOAL #1  Title Patient will be independent with HEP in order to improve functional outcomes.    Time 3    Period Weeks    Status New    Target Date 07/04/20      PT SHORT TERM GOAL #2   Title Patient will report at least 25% improvement in symptoms for improved quality of life.    Time 3    Period Weeks    Status New    Target Date 07/04/20             PT Long Term Goals - 06/13/20 1010      PT LONG TERM GOAL #1   Title Patient will report at least 75% improvement in symptoms for improved quality of life.    Time 6    Period Weeks    Status New    Target Date 07/25/20      PT LONG TERM GOAL #2   Title Patient will be able to complete 5x STS in under 20 seconds in order to reduce the risk of falls.    Time 6    Period Weeks    Status New    Target Date 07/25/20      PT LONG TERM GOAL #3   Title Patient will score at least 15/24 on DGI in order to demonstrate decreased risk for falling.    Time 6    Period Weeks    Status New    Target Date 07/25/20                 Plan - 07/05/20 1013    Clinical Impression Statement Continued with LE strength and stability exercises.  Increased standing exercises to 20 reps  today and to 3# weight with most.  Added standing hip extension and began static balance challenges.  Pt able to maintain static SLS for max of 4" Rt and 3" Lt.  Added vector stance to help improve upon this.  Tandem gait not as challenging as SLS, however frequent HHA and body sway with activity. Lt LE in back more challenging than Rt.  Added 2 12" hurdles into task to increase challenge.  Pt able to complete this task with min LOB but able to self correct.    Personal Factors and Comorbidities Age;Fitness;Past/Current Experience;Time since onset of injury/illness/exacerbation;Comorbidity 3+    Comorbidities DM, neuropathy, chronic back pain, HTN    Examination-Activity Limitations Locomotion Level;Transfers;Bathing;Caring for Others;Dressing;Lift;Stand;Stairs;Squat    Examination-Participation Restrictions Meal Prep;Church;Cleaning;Yard Work;Volunteer;Occupation;Shop;Community Activity    Rehab Potential Good    PT Frequency 2x / week    PT Duration 6 weeks    PT Treatment/Interventions ADLs/Self Care Home Management;Cryotherapy;Electrical Stimulation;Aquatic Therapy;Moist Heat;Traction;Ultrasound;DME Instruction;Gait training;Stair training;Functional mobility training;Therapeutic activities;Therapeutic exercise;Balance training;Neuromuscular re-education;Patient/family education;Manual techniques;Dry needling;Energy conservation;Splinting;Spinal Manipulations;Joint Manipulations    PT Next Visit Plan Continue LE strengtheing and static and dynamic balance training. Continue with LE PRE and emphasis on dorsflexion strength    PT Home Exercise Plan 12/15 using AD with ambulation. Added sidestepping along counter, ankle plntarflexion/dorsiflexion 12/29 STS, hip abd, DF isometric 1/4 walking program    Consulted and Agree with Plan of Care Patient           Patient will benefit from skilled therapeutic intervention in order to improve the following deficits and impairments:  Abnormal gait,Difficulty  walking,Decreased endurance,Decreased activity tolerance,Pain,Decreased balance,Improper body mechanics,Decreased mobility,Decreased strength,Impaired sensation  Visit Diagnosis: Other symptoms and signs involving the musculoskeletal system  Muscle weakness (generalized)  Other abnormalities of gait and mobility  Repeated falls  Other lack of coordination     Problem List Patient Active Problem List   Diagnosis Date Noted  . Left-sided weakness 05/28/2020  . History of stroke 01/19/2019  . Nonischemic cardiomyopathy ([place]) 04/09/2016  . Diabetes mellitus type 2 with neurological manifestations ([place]) 01/15/2011  . Chest pain on exertion 01/15/2011  . HTN (hypertension) 01/15/2011   Teena Irani, PTA/CLT [phone]  Teena Irani 07/05/2020, 12:57 PM  [place] [facility] [address] [place], [place], [address] Phone: [phone]   Fax:  [phone]  Name: THAYER EMBLETON MRN: [id number] Date of Birth: Apr 07, 1956

## 2020-07-10 ENCOUNTER — Other Ambulatory Visit: Payer: Self-pay

## 2020-07-10 ENCOUNTER — Ambulatory Visit (HOSPITAL_COMMUNITY): Payer: Medicare Other | Admitting: Physical Therapy

## 2020-07-10 DIAGNOSIS — R2689 Other abnormalities of gait and mobility: Secondary | ICD-10-CM

## 2020-07-10 DIAGNOSIS — R296 Repeated falls: Secondary | ICD-10-CM

## 2020-07-10 DIAGNOSIS — M6281 Muscle weakness (generalized): Secondary | ICD-10-CM

## 2020-07-10 DIAGNOSIS — R29898 Other symptoms and signs involving the musculoskeletal system: Secondary | ICD-10-CM

## 2020-07-10 NOTE — Therapy (Signed)
[facility] [address] [place], [place], [address] Phone: [phone]   Fax:  [phone]  Physical Therapy Treatment  Patient Details  Name: Dalton Porter MRN: [id number] Date of Birth: 10/15/1955 Referring Provider (PT): Kathlen Mody MD   Encounter Date: 07/10/2020   PT End of Session - 07/10/20 1034    Visit Number 7    Number of Visits 12    Date for PT Re-Evaluation 07/25/20    Authorization Type Medicare    Progress Note Due on Visit 10    PT Start Time 0920    PT Stop Time 1000    PT Time Calculation (min) 40 min    Activity Tolerance Patient tolerated treatment well    Behavior During Therapy [facility] for tasks assessed/performed           Past Medical History:  Diagnosis Date  . Back pain, chronic   . CHF (congestive heart failure) (HCC)   . Diabetes mellitus   . Nonischemic cardiomyopathy (HCC)    a. EF 40% by echo in 2003 b. 03/2016: cath showing normal cors and EF of 25-35%, EF 40-45% by echo with diffuse hypokinesis.  . Stroke [facility])     Past Surgical History:  Procedure Laterality Date  . ANKLE SURGERY    . APPENDECTOMY    . BACK SURGERY     x3  . CARDIAC CATHETERIZATION  2002  . CARDIAC CATHETERIZATION N/A 04/08/2016   Procedure: Left Heart Cath and Coronary Angiography;  Surgeon: Peter M Swaziland, MD;  Location: [facility] INVASIVE CV LAB;  Service: Cardiovascular;  Laterality: N/A;  . CHOLECYSTECTOMY    . ELBOW SURGERY    . ICD IMPLANT  01/26/2018   SJM Fortify Assura VR ICD implanted by Dr Marily Memos for primary prevention of sudden death  . THUMB FUSION      There were no vitals filed for this visit.   Subjective Assessment - 07/10/20 0925    Subjective pt states he is doing well today; not wearing the kinesiotape today.  no pain or issues    Currently in Pain? No/denies                             [facility] Adult PT Treatment/Exercise - 07/10/20 0001      Knee/Hip Exercises: Standing   Heel  Raises Both;1 set;20 reps    Heel Raises Limitations toeraises 20X    Knee Flexion Both;1 set;20 reps    Knee Flexion Limitations 3#    Hip Flexion Both;1 set;20 reps    Hip Flexion Limitations alternating marches 3#    Hip Abduction Both;20 reps    Abduction Limitations 3#    Hip Extension Both;20 reps    Extension Limitations 3#    Lateral Step Up Both;1 set;10 reps;Hand Hold: 1;Step Height: 6"    Forward Step Up Both;2 sets;10 reps;Hand Hold: 1;Step Height: 6"    SLS Max of 4" Rt, 3" Lt x5 trials    SLS with Vectors 5X5" each LE with 1 HHA    Other Standing Knee Exercises 2 6 and 2 12 inch hurdles 3RT    Other Standing Knee Exercises tandem stance 2X30" each intermittent HHA                    PT Short Term Goals - 06/13/20 1010      PT SHORT TERM GOAL #1   Title Patient will  be independent with HEP in order to improve functional outcomes.    Time 3    Period Weeks    Status New    Target Date 07/04/20      PT SHORT TERM GOAL #2   Title Patient will report at least 25% improvement in symptoms for improved quality of life.    Time 3    Period Weeks    Status New    Target Date 07/04/20             PT Long Term Goals - 06/13/20 1010      PT LONG TERM GOAL #1   Title Patient will report at least 75% improvement in symptoms for improved quality of life.    Time 6    Period Weeks    Status New    Target Date 07/25/20      PT LONG TERM GOAL #2   Title Patient will be able to complete 5x STS in under 20 seconds in order to reduce the risk of falls.    Time 6    Period Weeks    Status New    Target Date 07/25/20      PT LONG TERM GOAL #3   Title Patient will score at least 15/24 on DGI in order to demonstrate decreased risk for falling.    Time 6    Period Weeks    Status New    Target Date 07/25/20                 Plan - 07/10/20 1451    Clinical Impression Statement Continued with focus on improving LE strength and stability.  Progressed  this session with addition of tandem stance and forward lunges in good form.  PT only able to maintain balance 2-5 seconds before needing UE assist to self correct LOB.   Pt only required 1 seated rest break during session today.  General cues for form and overall hold times with exercises. No complaints of issues at end of session.    Personal Factors and Comorbidities Age;Fitness;Past/Current Experience;Time since onset of injury/illness/exacerbation;Comorbidity 3+    Comorbidities DM, neuropathy, chronic back pain, HTN    Examination-Activity Limitations Locomotion Level;Transfers;Bathing;Caring for Others;Dressing;Lift;Stand;Stairs;Squat    Examination-Participation Restrictions Meal Prep;Church;Cleaning;Yard Work;Volunteer;Occupation;Shop;Community Activity    Rehab Potential Good    PT Frequency 2x / week    PT Duration 6 weeks    PT Treatment/Interventions ADLs/Self Care Home Management;Cryotherapy;Electrical Stimulation;Aquatic Therapy;Moist Heat;Traction;Ultrasound;DME Instruction;Gait training;Stair training;Functional mobility training;Therapeutic activities;Therapeutic exercise;Balance training;Neuromuscular re-education;Patient/family education;Manual techniques;Dry needling;Energy conservation;Splinting;Spinal Manipulations;Joint Manipulations    PT Next Visit Plan Continue LE strengthening and progress to dynamic balance training. Continue with LE PRE and emphasis on dorsflexion strength.  Next session add rockerboard A/P and line walking.    PT Home Exercise Plan 12/15 using AD with ambulation. Added sidestepping along counter, ankle plntarflexion/dorsiflexion 12/29 STS, hip abd, DF isometric 1/4 walking program    Consulted and Agree with Plan of Care Patient           Patient will benefit from skilled therapeutic intervention in order to improve the following deficits and impairments:  Abnormal gait,Difficulty walking,Decreased endurance,Decreased activity tolerance,Pain,Decreased  balance,Improper body mechanics,Decreased mobility,Decreased strength,Impaired sensation  Visit Diagnosis: Other symptoms and signs involving the musculoskeletal system  Muscle weakness (generalized)  Other abnormalities of gait and mobility  Repeated falls     Problem List Patient Active Problem List   Diagnosis Date Noted  . Left-sided weakness 05/28/2020  . History of  stroke 01/19/2019  . Nonischemic cardiomyopathy (HCC) 04/09/2016  . 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 Chest pain on exertion 01/15/2011  . HTN (hypertension) 01/15/2011   Lurena Nida, PTA/CLT [phone]  Lurena Nida 07/10/2020, 2:53 PM  [facility] [address] [place], [place], [address] Phone: [phone]   Fax:  [phone]  Name: Dalton Porter MRN: [id number] Date of Birth: 01-29-1956

## 2020-07-12 ENCOUNTER — Encounter (HOSPITAL_COMMUNITY): Payer: Self-pay

## 2020-07-12 ENCOUNTER — Other Ambulatory Visit: Payer: Self-pay

## 2020-07-12 ENCOUNTER — Ambulatory Visit (HOSPITAL_COMMUNITY): Payer: Medicare Other

## 2020-07-12 DIAGNOSIS — R29898 Other symptoms and signs involving the musculoskeletal system: Secondary | ICD-10-CM

## 2020-07-12 DIAGNOSIS — M6281 Muscle weakness (generalized): Secondary | ICD-10-CM

## 2020-07-12 DIAGNOSIS — R2689 Other abnormalities of gait and mobility: Secondary | ICD-10-CM

## 2020-07-12 DIAGNOSIS — R296 Repeated falls: Secondary | ICD-10-CM

## 2020-07-12 NOTE — Therapy (Signed)
[facility] [address] [place], [place], [address] Phone: [phone]   Fax:  [phone]  Physical Therapy Treatment  Patient Details  Name: Dalton Porter MRN: [id number] Date of Birth: 09-Nov-1955 Referring Provider (PT): Kathlen Mody MD   Encounter Date: 07/12/2020   PT End of Session - 07/12/20 0951    Visit Number 8    Number of Visits 12    Date for PT Re-Evaluation 07/25/20    Authorization Type Medicare    Progress Note Due on Visit 10    PT Start Time 0934    PT Stop Time 1013    PT Time Calculation (min) 39 min    Activity Tolerance Patient tolerated treatment well    Behavior During Therapy [facility] for tasks assessed/performed           Past Medical History:  Diagnosis Date  . Back pain, chronic   . CHF (congestive heart failure) (HCC)   . Diabetes mellitus   . Nonischemic cardiomyopathy (HCC)    a. EF 40% by echo in 2003 b. 03/2016: cath showing normal cors and EF of 25-35%, EF 40-45% by echo with diffuse hypokinesis.  . Stroke [facility])     Past Surgical History:  Procedure Laterality Date  . ANKLE SURGERY    . APPENDECTOMY    . BACK SURGERY     x3  . CARDIAC CATHETERIZATION  2002  . CARDIAC CATHETERIZATION N/A 04/08/2016   Procedure: Left Heart Cath and Coronary Angiography;  Surgeon: Peter M Swaziland, MD;  Location: [facility] INVASIVE CV LAB;  Service: Cardiovascular;  Laterality: N/A;  . CHOLECYSTECTOMY    . ELBOW SURGERY    . ICD IMPLANT  01/26/2018   SJM Fortify Assura VR ICD implanted by Dr Marily Memos for primary prevention of sudden death  . THUMB FUSION      There were no vitals filed for this visit.   Subjective Assessment - 07/12/20 0937    Subjective Patient states he feels he hasn't had as much trouble picking up his left toes in the last week or so. LBP 4/10 today.            [facility] Adult PT Treatment/Exercise - 07/12/20 0001      Knee/Hip Exercises: Standing   Heel Raises Both;1 set;20 reps    Heel  Raises Limitations toeraises 20X    Knee Flexion Both;1 set;20 reps    Knee Flexion Limitations 3#    Hip Flexion Both;1 set;20 reps    Hip Flexion Limitations alternating marches 3#   1 UE assist   Hip Abduction Both;20 reps    Abduction Limitations 3#    Hip Extension Both;20 reps    Extension Limitations 3#    Rocker Board 2 minutes   A/P   SLS 30 sec x2 each leg    SLS with Vectors 5X5" each LE with 1 HHA    Other Standing Knee Exercises tandem stance 2X30" each intermittent HHA             PT Education - 07/12/20 0940    Education Details Discussed purpose and technique of interventions throughout session. Discussed increasing walking program inside house with use of SPC.    Person(s) Educated Patient    Methods Explanation    Comprehension Verbalized understanding            PT Short Term Goals - 07/12/20 0948      PT SHORT TERM GOAL #1   Title Patient  will be independent with HEP in order to improve functional outcomes.    Time 3    Period Weeks    Status Achieved    Target Date 07/04/20      PT SHORT TERM GOAL #2   Title Patient will report at least 25% improvement in symptoms for improved quality of life.    Baseline 07/12/2020 - Patient reoprts 100% improvement in quality of life but still has a tremendous fear of falling.    Time 3    Period Weeks    Status Achieved    Target Date 07/04/20             PT Long Term Goals - 07/12/20 0948      PT LONG TERM GOAL #1   Title Patient will report at least 75% improvement in symptoms for improved quality of life.    Time 6    Period Weeks    Status On-going      PT LONG TERM GOAL #2   Title Patient will be able to complete 5x STS in under 20 seconds in order to reduce the risk of falls.    Time 6    Period Weeks    Status On-going      PT LONG TERM GOAL #3   Title Patient will score at least 15/24 on DGI in order to demonstrate decreased risk for falling.    Time 6    Period Weeks    Status  On-going                 Plan - 07/12/20 3614    Clinical Impression Statement Continued with focus on improving LE strength and stability.  Progressed this session with addition of rockerboard with difficulty attaining full plantarflexion without compensation requiring tactile cues to negate forward shift of hips.  PT only able to maintain SLS balance 2-3 seconds before needing UE assist to self correct LOB.   Pt did not require seated rest break during session today.  Reports of muscle fatigue at end of session.    Personal Factors and Comorbidities Age;Fitness;Past/Current Experience;Time since onset of injury/illness/exacerbation;Comorbidity 3+    Comorbidities DM, neuropathy, chronic back pain, HTN    Examination-Activity Limitations Locomotion Level;Transfers;Bathing;Caring for Others;Dressing;Lift;Stand;Stairs;Squat    Examination-Participation Restrictions Meal Prep;Church;Cleaning;Yard Work;Volunteer;Occupation;Shop;Community Activity    Rehab Potential Good    PT Frequency 2x / week    PT Duration 6 weeks    PT Treatment/Interventions ADLs/Self Care Home Management;Cryotherapy;Electrical Stimulation;Aquatic Therapy;Moist Heat;Traction;Ultrasound;DME Instruction;Gait training;Stair training;Functional mobility training;Therapeutic activities;Therapeutic exercise;Balance training;Neuromuscular re-education;Patient/family education;Manual techniques;Dry needling;Energy conservation;Splinting;Spinal Manipulations;Joint Manipulations    PT Next Visit Plan Continue LE strengthening and progress to dynamic balance training. Continue with LE PRE and emphasis on dorsflexion strength.  Next session add rockerboard A/P and line walking.    PT Home Exercise Plan 12/15 using AD with ambulation. Added sidestepping along counter, ankle plntarflexion/dorsiflexion 12/29 STS, hip abd, DF isometric 1/4 walking program    Consulted and Agree with Plan of Care Patient           Patient will benefit  from skilled therapeutic intervention in order to improve the following deficits and impairments:  Abnormal gait,Difficulty walking,Decreased endurance,Decreased activity tolerance,Pain,Decreased balance,Improper body mechanics,Decreased mobility,Decreased strength,Impaired sensation  Visit Diagnosis: Other symptoms and signs involving the musculoskeletal system  Muscle weakness (generalized)  Other abnormalities of gait and mobility  Repeated falls     Problem Porter Patient Active Problem Porter   Diagnosis Date Noted  . Left-sided weakness  05/28/2020  . History of stroke 01/19/2019  . Nonischemic cardiomyopathy (HCC) 04/09/2016  . 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 Chest pain on exertion 01/15/2011  . HTN (hypertension) 01/15/2011    Katina Dung. Hartnett-Rands, MS, PT Per Ladoris Gene [facility] #[id number] 07/12/2020, 10:22 AM  [place] [facility] [address] [place], [place], [address] Phone: [phone]   Fax:  [phone]  Name: Dalton Porter MRN: [id number] Date of Birth: [birth year]

## 2020-07-17 ENCOUNTER — Other Ambulatory Visit: Payer: Self-pay

## 2020-07-17 ENCOUNTER — Encounter (HOSPITAL_COMMUNITY): Payer: Self-pay | Admitting: Physical Therapy

## 2020-07-17 ENCOUNTER — Ambulatory Visit (HOSPITAL_COMMUNITY): Payer: Medicare Other | Admitting: Physical Therapy

## 2020-07-17 DIAGNOSIS — R29898 Other symptoms and signs involving the musculoskeletal system: Secondary | ICD-10-CM

## 2020-07-17 DIAGNOSIS — M6281 Muscle weakness (generalized): Secondary | ICD-10-CM

## 2020-07-17 DIAGNOSIS — R2689 Other abnormalities of gait and mobility: Secondary | ICD-10-CM

## 2020-07-17 NOTE — Therapy (Signed)
[place], [place], [address] Phone: [phone]   Fax:  [phone]  Physical Therapy Treatment/ Discharge Summary  Patient Details  Name: Dalton Porter MRN: [id number] Date of Birth: [birth year] Referring Provider (PT): Hosie Poisson MD   Encounter Date: 07/17/2020  PHYSICAL THERAPY DISCHARGE SUMMARY  Visits from Start of Care: 9  Current functional level related to goals / functional outcomes: See below   Remaining deficits: See below   Education / Equipment: See below  Plan: Patient agrees to discharge.  Patient goals were met. Patient is being discharged due to being pleased with the current functional level.  ?????        PT End of Session - 07/17/20 0908    Visit Number 9    Number of Visits 12    Date for PT Re-Evaluation 07/25/20    Authorization Type Medicare    Progress Note Due on Visit 10    PT Start Time 0912    PT Stop Time 0948    PT Time Calculation (min) 36 min    Activity Tolerance Patient tolerated treatment well    Behavior During Therapy [facility] for tasks assessed/performed           Past Medical History:  Diagnosis Date  . Back pain, chronic   . CHF (congestive heart failure) ([place])   . Diabetes mellitus   . Nonischemic cardiomyopathy ([place])    a. EF 40% by echo in 2003 b. 03/2016: cath showing normal cors and EF of 25-35%, EF 40-45% by echo with diffuse hypokinesis.  . Stroke [facility])     Past Surgical History:  Procedure Laterality Date  . ANKLE SURGERY    . APPENDECTOMY    . BACK SURGERY     x3  . CARDIAC CATHETERIZATION  2002  . CARDIAC CATHETERIZATION N/A 04/08/2016   Procedure: Left Heart Cath and Coronary Angiography;  Surgeon: Peter M Martinique, MD;  Location: [place] CV LAB;  Service: Cardiovascular;  Laterality: N/A;  . CHOLECYSTECTOMY    . ELBOW SURGERY    . ICD IMPLANT  01/26/2018   SJM Fortify Assura VR ICD implanted by Dr [name] for primary prevention of  sudden death  . THUMB FUSION      There were no vitals filed for this visit.   Subjective Assessment - 07/17/20 0909    Subjective Patient states his home exercises are going well. Patient states 60% improvement with PT intervention. He remains limited with stability and strength. He continues to have worsening of symptoms with fatigue. He fell yesterday when getting out of bed. He continues to have trouble with L arm and leg and they feel like they are not there sometimes. He is working to go to a Licensed conveyancer for further assessment.    Limitations Standing;House hold activities    Patient Stated Goals to not fall    Currently in Pain? Yes    Pain Score 4     Pain Location Back    Pain Orientation Lower    Pain Descriptors / Indicators Sore    Pain Type Chronic pain              [facility] PT Assessment - 07/17/20 0001      Assessment   Medical Diagnosis left side weakness    Referring Provider (PT) Hosie Poisson MD    Onset Date/Surgical Date 05/28/20    Hand Dominance Right    Next MD Visit Pt to  make a follow up appointment with the nuerologist    Prior Therapy Previous OP PT for bilateral legs, ankles, and back.       Precautions   Precautions Fall      Restrictions   Weight Bearing Restrictions No      Balance Screen   Has the patient fallen in the past 6 months Yes    How many times? 5-6    Has the patient had a decrease in activity level because of a fear of falling?  Yes    Is the patient reluctant to leave their home because of a fear of falling?  Yes      [place] residence      Prior Function   Level of Independence Independent    Vocation On disability      Cognition   Overall Cognitive Status Within Functional Limits for tasks assessed      Observation/Other Assessments   Observations Ambulates with SPC, muscle atrophy throughout bilateral LE    Focus on Therapeutic Outcomes (FOTO)  n/a      Strength   Right Hip  Flexion 4+/5    Left Hip Flexion 4/5    Right Knee Flexion 5/5    Right Knee Extension 5/5    Left Knee Flexion 4/5    Left Knee Extension 4+/5    Right Ankle Dorsiflexion 4+/5    Left Ankle Dorsiflexion 4+/5      Transfers   Five time sit to stand comments  18.62 seconds without UE use, minimal LE trembling, no loss of balance      Dynamic Gait Index   Level Surface Mild Impairment    Change in Gait Speed Mild Impairment    Gait with Horizontal Head Turns Mild Impairment    Gait with Vertical Head Turns Mild Impairment    Gait and Pivot Turn Mild Impairment    Step Over Obstacle Mild Impairment    Step Around Obstacles Mild Impairment    Steps Mild Impairment    Total Score 16    DGI comment: increased risk for falling , but improved from initial evaluation                                 PT Education - 07/17/20 0909    Education Details Patient educated on HEP, exercise mechanics, reassessment findings, progress made, returning to PT if needed, walking program, limiting activity that he doesnt feel confident with, preaching, kitchen work may be unsafe with current level of balance    Person(s) Educated Patient    Methods Explanation;Demonstration    Comprehension Verbalized understanding;Returned demonstration            PT Short Term Goals - 07/12/20 0948      PT SHORT TERM GOAL #1   Title Patient will be independent with HEP in order to improve functional outcomes.    Time 3    Period Weeks    Status Achieved    Target Date 07/04/20      PT SHORT TERM GOAL #2   Title Patient will report at least 25% improvement in symptoms for improved quality of life.    Baseline 07/12/2020 - Patient reoprts 100% improvement in quality of life but still has a tremendous fear of falling.    Time 3    Period Weeks    Status Achieved    Target Date  07/04/20             PT Long Term Goals - 07/17/20 0933      PT LONG TERM GOAL #1   Title Patient  will report at least 75% improvement in symptoms for improved quality of life.    Time 6    Period Weeks    Status Not Met      PT LONG TERM GOAL #2   Title Patient will be able to complete 5x STS in under 20 seconds in order to reduce the risk of falls.    Time 6    Period Weeks    Status Achieved      PT LONG TERM GOAL #3   Title Patient will score at least 15/24 on DGI in order to demonstrate decreased risk for falling.    Time 6    Period Weeks    Status Achieved                 Plan - 07/17/20 0908    Clinical Impression Statement Patient has met 2/2 short term goals and 2/3 long term goals with ability to complete HEP and improvement in symptoms, balance, gait, strength, functional mobility, activity tolerance. Patient continues to remain at an increased risk for falling but is showing improved stability and balance since beginning physical therapy. Patient feels confident in ability to complete HEP to manage symptoms independently and is educated on returning to PT if necessary. Patient educated as listed in education section. Patient discharged from skilled physical therapy at this time.    Personal Factors and Comorbidities Age;Fitness;Past/Current Experience;Time since onset of injury/illness/exacerbation;Comorbidity 3+    Comorbidities DM, neuropathy, chronic back pain, HTN    Examination-Activity Limitations Locomotion Level;Transfers;Bathing;Caring for Others;Dressing;Lift;Stand;Stairs;Squat    Examination-Participation Restrictions Meal Prep;Church;Cleaning;Yard Work;Volunteer;Occupation;Shop;Community Activity    Rehab Potential Good    PT Frequency 2x / week    PT Duration 6 weeks    PT Treatment/Interventions ADLs/Self Care Home Management;Cryotherapy;Electrical Stimulation;Aquatic Therapy;Moist Heat;Traction;Ultrasound;DME Instruction;Gait training;Stair training;Functional mobility training;Therapeutic activities;Therapeutic exercise;Balance  training;Neuromuscular re-education;Patient/family education;Manual techniques;Dry needling;Energy conservation;Splinting;Spinal Manipulations;Joint Manipulations    PT Next Visit Plan n/a    PT Home Exercise Plan 12/15 using AD with ambulation. Added sidestepping along counter, ankle plntarflexion/dorsiflexion 12/29 STS, hip abd, DF isometric 1/4 walking program    Consulted and Agree with Plan of Care Patient           Patient will benefit from skilled therapeutic intervention in order to improve the following deficits and impairments:  Abnormal gait,Difficulty walking,Decreased endurance,Decreased activity tolerance,Pain,Decreased balance,Improper body mechanics,Decreased mobility,Decreased strength,Impaired sensation  Visit Diagnosis: Other symptoms and signs involving the musculoskeletal system  Muscle weakness (generalized)  Other abnormalities of gait and mobility     Problem List Patient Active Problem List   Diagnosis Date Noted  . Left-sided weakness 05/28/2020  . History of stroke 01/19/2019  . Nonischemic cardiomyopathy ([place]) 04/09/2016  . Diabetes mellitus type 2 with neurological manifestations ([place]) 01/15/2011  . Chest pain on exertion 01/15/2011  . HTN (hypertension) 01/15/2011    9:56 AM, 07/17/20 Mearl Latin PT, DPT Physical Therapist at [place], [place], [address] Phone: [phone]   Fax:  [phone]  Name: Dalton Porter MRN: [id number] Date of Birth: October 13, 1955

## 2020-07-19 ENCOUNTER — Encounter (HOSPITAL_COMMUNITY): Payer: Medicare Other

## 2020-07-24 ENCOUNTER — Encounter (HOSPITAL_COMMUNITY): Payer: Medicare Other | Admitting: Physical Therapy

## 2020-07-26 ENCOUNTER — Encounter (HOSPITAL_COMMUNITY): Payer: Medicare Other

## 2020-08-03 ENCOUNTER — Encounter: Payer: Self-pay | Admitting: Neurology

## 2020-08-08 ENCOUNTER — Telehealth: Payer: Self-pay

## 2020-08-08 ENCOUNTER — Ambulatory Visit (INDEPENDENT_AMBULATORY_CARE_PROVIDER_SITE_OTHER): Payer: No Typology Code available for payment source

## 2020-08-08 DIAGNOSIS — I5022 Chronic systolic (congestive) heart failure: Secondary | ICD-10-CM | POA: Diagnosis not present

## 2020-08-08 DIAGNOSIS — Z95810 Presence of automatic (implantable) cardiac defibrillator: Secondary | ICD-10-CM

## 2020-08-08 NOTE — Progress Notes (Signed)
EPIC Encounter for ICM Monitoring  Patient Name: Dalton Porter is a 65 y.o. male Date: 08/08/2020 [facility] Primary Cardiologist:Branch Electrophysiologist:Allred 06/15/2020 Office Weight:236lbs   Attempted call to patient and unable to reach.  Left message to return call. Transmission reviewed.   CorvueThoracic impedancesuggesting possible fluid accumulation starting 08/07/2020 and also from 1/3/-2/06.  Prescribed:   Furosemide20 mgtake2 tablets (40 mgtotal)daily.  Spironolactone 25 mg take 1 tablet daily  Labs: 05/30/2020 Creatinine 1.30, BUN 21, Potassium 4.5, Sodium 136, GFR >60  05/28/2020 Creatinine 1.40, BUN 21, Potassium 4.8, Sodium 139, GFR 59  A complete set of results can be found in Results Review.  Recommendations:Unable to reach.    Follow-up plan: ICM clinic phone appointment on2/17/2022 to recheck fluid levels. 91 day device clinic remote transmission3/14/2022.  EP/Cardiology Office Visits:10/22/2020 with Dr Wyline Mood.   Copy of ICM check sent to Dr.Allredand Dr Wyline Mood for review.     3 month ICM trend: 08/08/2020.    1 Year ICM trend:       Karie Soda, RN 08/08/2020 12:55 PM

## 2020-08-08 NOTE — Telephone Encounter (Signed)
Remote ICM transmission received.  Attempted call to patient regarding ICM remote transmission and left message to return call   

## 2020-08-16 ENCOUNTER — Ambulatory Visit (INDEPENDENT_AMBULATORY_CARE_PROVIDER_SITE_OTHER): Payer: No Typology Code available for payment source

## 2020-08-16 DIAGNOSIS — Z95810 Presence of automatic (implantable) cardiac defibrillator: Secondary | ICD-10-CM

## 2020-08-16 DIAGNOSIS — I5022 Chronic systolic (congestive) heart failure: Secondary | ICD-10-CM

## 2020-08-17 ENCOUNTER — Telehealth: Payer: Self-pay

## 2020-08-17 NOTE — Progress Notes (Signed)
EPIC Encounter for ICM Monitoring  Patient Name: Dalton Porter is a 65 y.o. male Date: 08/17/2020 [facility] Primary Cardiologist:Branch Electrophysiologist:Allred 06/15/2020 OfficeWeight:236lbs   Attempted call to patient and unable to reach.  Transmission reviewed.   CorvueThoracic impedancetrending toward baseline normal.   Prescribed:   Furosemide20 mgtake2 tablets (40 mgtotal)daily.  Spironolactone 25 mg take 1 tablet daily  Labs: 05/30/2020 Creatinine 1.30, BUN 21, Potassium 4.5, Sodium 136, GFR >60  05/28/2020 Creatinine 1.40, BUN 21, Potassium 4.8, Sodium 139, GFR 59  A complete set of results can be found in Results Review.  Recommendations:Unable to reach.    Follow-up plan: ICM clinic phone appointment on3/15/2022. 91 day device clinic remote transmission3/14/2022.  EP/Cardiology Office Visits:10/22/2020 with Dr Wyline Mood.   Copy of ICM check sent to Dr.Allred.  3 month ICM trend: 08/16/2020.    1 Year ICM trend:       Karie Soda, RN 08/17/2020 3:24 PM

## 2020-08-17 NOTE — Telephone Encounter (Signed)
Remote ICM transmission received.  Attempted call to patient regarding ICM remote transmission and no answer.  

## 2020-08-29 ENCOUNTER — Telehealth: Payer: Self-pay

## 2020-08-29 NOTE — Telephone Encounter (Signed)
Returned call to patient as requested by voice mail message.  Pt stated for past 3-4 days he has been experiencing left arm numbness that travels to the neck, shortness of breath when walking around the house making it feel like is difficult to breath, sharp chest pains lasting anywhere from 30 sec to 3 minutes off and on throughout the day.  He has history of heart attacks he said but this pain does not feel the same as previous heart attacks.    Advised patient to call 911 so that he can be evaluated if this could be another heart attack.  Explained he may not have same heart attack symptoms before and this is the only way to evaluate if he is having another heart attack.   He did not really want to go to the ER and advised he can call Dr Verna Czech office for further advice but will probably advise him to go to ER as well.    He said he understood he should go to ER and thought that may the answer he got when he called for advice.    Advised he should proceed to ER or call 911.  He verbalized understanding.

## 2020-09-10 ENCOUNTER — Ambulatory Visit (INDEPENDENT_AMBULATORY_CARE_PROVIDER_SITE_OTHER): Payer: Medicare Other

## 2020-09-10 DIAGNOSIS — I5022 Chronic systolic (congestive) heart failure: Secondary | ICD-10-CM | POA: Diagnosis not present

## 2020-09-11 ENCOUNTER — Ambulatory Visit (INDEPENDENT_AMBULATORY_CARE_PROVIDER_SITE_OTHER): Payer: No Typology Code available for payment source

## 2020-09-11 DIAGNOSIS — Z95810 Presence of automatic (implantable) cardiac defibrillator: Secondary | ICD-10-CM

## 2020-09-11 DIAGNOSIS — I5022 Chronic systolic (congestive) heart failure: Secondary | ICD-10-CM | POA: Diagnosis not present

## 2020-09-11 LAB — CUP PACEART REMOTE DEVICE CHECK
Battery Remaining Longevity: 92 mo
Battery Remaining Percentage: 80 %
Battery Voltage: 3.01 V
Brady Statistic RV Percent Paced: 7.5 %
Date Time Interrogation Session: 20220314174140
HighPow Impedance: 81 Ohm
HighPow Impedance: 81 Ohm
Implantable Lead Implant Date: 20190730
Implantable Lead Location: 753860
Implantable Pulse Generator Implant Date: 20190730
Lead Channel Impedance Value: 360 Ohm
Lead Channel Pacing Threshold Amplitude: 0.75 V
Lead Channel Pacing Threshold Pulse Width: 0.5 ms
Lead Channel Sensing Intrinsic Amplitude: 11.5 mV
Lead Channel Setting Pacing Amplitude: 2.5 V
Lead Channel Setting Pacing Pulse Width: 0.5 ms
Lead Channel Setting Sensing Sensitivity: 0.5 mV
Pulse Gen Serial Number: 9839597

## 2020-09-14 ENCOUNTER — Telehealth: Payer: Self-pay

## 2020-09-14 NOTE — Telephone Encounter (Signed)
Remote ICM transmission received.  Attempted call to patient regarding ICM remote transmission and no answer.  

## 2020-09-14 NOTE — Progress Notes (Signed)
EPIC Encounter for ICM Monitoring  Patient Name: Dalton Porter is a 65 y.o. male Date: 09/14/2020 [facility] Primary Cardiologist:Branch Electrophysiologist:Allred 06/15/2020 OfficeWeight:236lbs   Attempted call to patient and unable to reach.  Transmission reviewed.   CorvueThoracic impedancetrending toward baseline normal.   Prescribed:   Furosemide20 mgtake2 tablets (40 mgtotal)daily.  Spironolactone 25 mg take 1 tablet daily  Labs: 05/30/2020 Creatinine1.30, BUN21, Potassium4.5, Sodium136, GFR>60  05/28/2020 Creatinine1.40, BUN21, Potassium4.8, Sodium139, [id number] A complete set of results can be found in Results Review.  Recommendations:Unable to reach.  Follow-up plan: ICM clinic phone appointment on4/19/2022. 91 day device clinic remote transmission6/13/2022.  EP/Cardiology Office Visits:4/25/2022with Dr Wyline Mood.   Copy of ICM check sent to Dr.Allred.  3 month ICM trend: 09/11/2020.    1 Year ICM trend:       Karie Soda, RN 09/14/2020 1:24 PM

## 2020-09-18 NOTE — Progress Notes (Signed)
Remote ICD transmission.   

## 2020-10-07 NOTE — Progress Notes (Signed)
NEUROLOGY CONSULTATION NOTE  Dalton Porter Porter MRN: [id number] DOB: 1956-02-24  Referring provider: [facility] Primary care provider: [facility]  Reason for consult:  stroke  Assessment/Plan:   1.  Residual left hemiparesis - possibly secondary to MRI-negative right hemispheric stroke.  Given associated headache, consider migraine but no prior history of migraines and still endorses mild but residual weakness. It seems that neurology in the hospital considered functional etiology as well.  2.  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  Type 2 diabetes mellitus 4.  Hypertension 5.  Hyperlipidemia 6.  Nonischemic cardiomyopathy 7.  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  Secondary stroke prevention as managed by PCP/cardiology: - ASA 325mg  daily - statin.  LDL goal less than 70 - Glycemic control. Hgb A1c goal less than 7 - Blood pressure control 2.  Mediterranean diet 3.  Routine exercise 4.  Follow up 6 months.   Subjective:  Dalton Porter is a 65 year old right-handed male with nonischemic cardiomyopathy, CHF, type 2 diabetes mellitus and chronic back pain who presents for cerebellar stroke syndrome.  [facility].  Still with left sided weakness.  Originally speech problems - speech therapy helped- falls every now and then - more prominent in shower or while dressing - numbness in feet Knew wat to say but couldn't get it out - sutter  Typical headache tension - sinus headache - severe sinus infection since then 10-12 years - responds to Excedrin or ibuprofen -   In October 2021.  He describes severe stabbing pain lasting 1-2 minutes followed by persistent dull non-throbbing headache in between a dull persistent non-throbbing headache.  He had a similar headache in June 2021 in which he went to the ED at [facility] where he  workup included sed rate 45 and CT head showing small chronic infarct in the right occipital lobe but no acute abnormality.  Otherwise, reports only history of occasional tension or "sinus" headaches.  He presented to [facility] on 05/28/2020 after endorsing left-sided upper and lower extremity weakness and difficulty with getting words out for a week.  CTA of head and neck personally reviewed showed mild atherosclerotic disease involving the left carotid bifurcation, left V4 vertebral artery and bilateral cavernous carotid arteries but no emergent large vessel occlusion or hemodynamically significant stenosis.  He was transferred to [facility] for AICD to be turned off for MRI.  MRI of brain personally reviewed showed mild chronic small vessel ischemic changes but no acute findings such as stroke.  MRI of cervical spine personally reviewed showed mild degenerative changes with mild foraminal narrowing but overall unremarkable.  He was evaluated by neurology who noted giveaway weakness on the left side.  Echocardiogram showed EF 40-45% with global hypokinesis and indeterminate left ventricular diastolic parameters.  Hgb A1c was 7.3 and LDL 17.  To further evaluate headache, he had an MRV of the head with and without contrast personally reviewed which was negative for dural sinus thrombosis.  Sed rate and CRP were mildly elevated at 67 and 3 respectively but not fitting picture of giant cell arteritis.  He endorses numbness in the feet.  Neuropathy labs, unremarkable, demonstrated negative ANA, SPEP/IFE negative for monoclonal gammopathy, B12 398, methylmalonic acid level 210, negative SSA/SSB antibodies.  CK was 85.  HIV negative.  He was discharged with increased  ASA from 81mg  to 325mg  daily.  He underwent outpatient PT/OT/speech therapy which was helpful.  He still endorses some left sided weakness for which he uses a cane.  Since the stroke, he has postural and kinetic tremor in the hands, left  worse than right.    Current medications:  ASA 325mg , atorvastatin 20mg , furosemide 40mg , metformin, Entresto, gabapentin 300mg  TID, B12  PAST MEDICAL HISTORY: Past Medical History:  Diagnosis Date  . Back pain, chronic   . CHF (congestive heart failure) (HCC)   . Diabetes mellitus   . Nonischemic cardiomyopathy (HCC)    a. EF 40% by echo in 2003 b. 03/2016: cath showing normal cors and EF of 25-35%, EF 40-45% by echo with diffuse hypokinesis.  . Stroke [facility])     PAST SURGICAL HISTORY: Past Surgical History:  Procedure Laterality Date  . ANKLE SURGERY    . APPENDECTOMY    . BACK SURGERY     x3  . CARDIAC CATHETERIZATION  2002  . CARDIAC CATHETERIZATION N/A 04/08/2016   Procedure: Left Heart Cath and Coronary Angiography;  Surgeon: Peter M , MD;  Location: [facility] INVASIVE CV LAB;  Service: Cardiovascular;  Laterality: N/A;  . CHOLECYSTECTOMY    . ELBOW SURGERY    . ICD IMPLANT  01/26/2018   SJM Fortify Assura VR ICD implanted by Dr [facility] for primary prevention of sudden death  . THUMB FUSION      MEDICATIONS: Current Outpatient Medications on File Prior to Visit  Medication Sig Dispense Refill  . aspirin 325 MG tablet Take 325 mg by mouth daily.    2003 atorvastatin (LIPITOR) 20 MG tablet Take 1 tablet (20 mg total) by mouth daily. 90 tablet 3  . Cholecalciferol (VITAMIN D3) 5000 units CAPS Take 1 capsule by mouth daily.    . furosemide (LASIX) 20 MG tablet Take 2 tablets (40 mg total) by mouth daily. 270 tablet 0  . gabapentin (NEURONTIN) 300 MG capsule Take 300 mg by mouth 3 (three) times daily.    06/08/2016 glipiZIDE (GLUCOTROL) 5 MG tablet Take 5 mg by mouth 2 (two) times daily before a meal.    . metFORMIN (GLUCOPHAGE) 500 MG tablet Take 500 mg by mouth 2 (two) times daily with a meal.    . metoprolol succinate (TOPROL XL) 25 MG 24 hr tablet Take 1 tablet (25 mg total) by mouth 2 (two) times daily. 45 tablet 0  . nitroGLYCERIN (NITROSTAT) 0.4 MG SL tablet Place 1 tablet (0.4 mg  total) under the tongue every 5 (five) minutes as needed for chest pain. 25 tablet 3  . sacubitril-valsartan (ENTRESTO) 49-51 MG Take 1 tablet by mouth 2 (two) times daily. 60 tablet 3  . spironolactone (ALDACTONE) 25 MG tablet Take 25 mg by mouth daily. [facility] and [facility] reports on 11/30/2018 that he takes daily.    . TRAZODONE HCL PO Take 100 mg by mouth at bedtime.     . vitamin B-12 (CYANOCOBALAMIN) 500 MCG tablet Take 1 tablet (500 mcg total) by mouth daily. 30 tablet 1   No current facility-administered medications on file prior to visit.    ALLERGIES: Allergies  Allergen Reactions  . 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 Codeine Rash    headache    FAMILY HISTORY: Family History  Problem Relation Age of Onset  . Stroke Other   .  Pancreatic cancer Father   . Healthy Sister   . Other Brother        died in a fire  . Leukemia Maternal Grandmother   . Healthy Sister     Objective:  Blood pressure 135/63, pulse 96, resp. rate 20, height 5\' 11"  (1.803 m), weight 241 lb (109.3 kg), SpO2 97 %. General: No acute distress.  Patient appears well-groomed.   Head:  Normocephalic/atraumatic Eyes:  fundi examined but not visualized Neck: supple, no paraspinal tenderness, full range of motion Back: No paraspinal tenderness Heart: regular rate and rhythm Lungs: Clear to auscultation bilaterally. Vascular: No carotid bruits. Neurological Exam: Mental status: alert and oriented to person, place, and time, speech fluent and not dysarthric, language intact. Cranial nerves: CN I: not tested CN II: pupils equal, round and reactive to light, visual fields intact CN Porter, IV, VI:  full range of motion, no nystagmus, no ptosis CN V: Endorses reduced left V1-V2 sensation CN VII: upper and lower face symmetric CN VIII: hearing intact CN IX, X: gag intact, uvula midline CN XI:  sternocleidomastoid and trapezius muscles intact CN XII: tongue midline Bulk & Tone: normal, no fasciculations. Motor:  Postural and kinetic tremor in hands, left greater than right; muscle strength 5-/5 left deltoid, left hip flexion, 4+/5 left ankle dorsiflexion and 2/5 bilateral EHL; otherwise, 5/5 throughout Sensation:  Pinprick sensation reduced in left hand, left foot, reduced vibratory sensation in right foot. Deep Tendon Reflexes:  2+ throughout,  toes downgoing.   Finger to nose testing:  Without dysmetria.   Heel to shin:  Without dysmetria.   Gait:  Mildly unsteady, wobbling.  Romberg with sway    Thank you for allowing me to take part in the care of this patient.  , DO

## 2020-10-08 ENCOUNTER — Other Ambulatory Visit: Payer: Self-pay

## 2020-10-08 ENCOUNTER — Encounter: Payer: Self-pay | Admitting: Neurology

## 2020-10-08 ENCOUNTER — Ambulatory Visit (INDEPENDENT_AMBULATORY_CARE_PROVIDER_SITE_OTHER): Payer: No Typology Code available for payment source | Admitting: Neurology

## 2020-10-08 VITALS — BP 135/63 | HR 96 | Resp 20 | Ht 71.0 in | Wt 241.0 lb

## 2020-10-08 DIAGNOSIS — R531 Weakness: Secondary | ICD-10-CM | POA: Diagnosis not present

## 2020-10-08 DIAGNOSIS — Z8673 Personal history of transient ischemic attack (TIA), and cerebral infarction without residual deficits: Secondary | ICD-10-CM

## 2020-10-08 DIAGNOSIS — I10 Essential (primary) hypertension: Secondary | ICD-10-CM | POA: Diagnosis not present

## 2020-10-08 DIAGNOSIS — E1142 Type 2 diabetes mellitus with diabetic polyneuropathy: Secondary | ICD-10-CM | POA: Diagnosis not present

## 2020-10-08 DIAGNOSIS — I428 Other cardiomyopathies: Secondary | ICD-10-CM

## 2020-10-08 NOTE — Patient Instructions (Signed)
1.  Continue aspirin 325mg  daily 2.  Continue medications for cholesterol, diabetes and blood pressure 3.  Mediterranean diet (see below) 4.  Continue exercises as instructed by the physical therapist 5.  Follow up 6 months.   Mediterranean Diet A Mediterranean diet refers to food and lifestyle choices that are based on the traditions of countries located on the . This way of eating has been shown to help prevent certain conditions and improve outcomes for people who have chronic diseases, like kidney disease and heart disease. What are tips for following this plan? Lifestyle  Cook and eat meals together with your family, when possible.  Drink enough fluid to keep your urine clear or pale yellow.  Be physically active every day. This includes: ? Aerobic exercise like running or swimming. ? Leisure activities like gardening, walking, or housework.  Get 7-8 hours of sleep each night.  If recommended by your health care provider, drink red wine in moderation. This means 1 glass a day for nonpregnant women and 2 glasses a day for men. A glass of wine equals 5 oz (150 mL). Reading food labels  Check the serving size of packaged foods. For foods such as rice and pasta, the serving size refers to the amount of cooked product, not dry.  Check the total fat in packaged foods. Avoid foods that have saturated fat or trans fats.  Check the ingredients list for added sugars, such as corn syrup.   Shopping  At the grocery store, buy most of your food from the areas near the walls of the store. This includes: ? Fresh fruits and vegetables (produce). ? Grains, beans, nuts, and seeds. Some of these may be available in unpackaged forms or large amounts (in bulk). ? Fresh seafood. ? Poultry and eggs. ? Low-fat dairy products.  Buy whole ingredients instead of prepackaged foods.  Buy fresh fruits and vegetables in-season from local farmers markets.  Buy frozen fruits and  vegetables in resealable bags.  If you do not have access to quality fresh seafood, buy precooked frozen shrimp or canned fish, such as tuna, salmon, or sardines.  Buy small amounts of raw or cooked vegetables, salads, or olives from the deli or salad bar at your store.  Stock your pantry so you always have certain foods on hand, such as olive oil, canned tuna, canned tomatoes, rice, pasta, and beans. Cooking  Cook foods with extra-virgin olive oil instead of using butter or other vegetable oils.  Have meat as a side dish, and have vegetables or grains as your main dish. This means having meat in small portions or adding small amounts of meat to foods like pasta or stew.  Use beans or vegetables instead of meat in common dishes like chili or lasagna.  Experiment with different cooking methods. Try roasting or broiling vegetables instead of steaming or sauteing them.  Add frozen vegetables to soups, stews, pasta, or rice.  Add nuts or seeds for added healthy fat at each meal. You can add these to yogurt, salads, or vegetable dishes.  Marinate fish or vegetables using olive oil, lemon juice, garlic, and fresh herbs. Meal planning  Plan to eat 1 vegetarian meal one day each week. Try to work up to 2 vegetarian meals, if possible.  Eat seafood 2 or more times a week.  Have healthy snacks readily available, such as: ? Vegetable sticks with hummus. ? [employer] yogurt. ? Fruit and nut trail mix.  Eat balanced meals throughout the week. This  includes: ? Fruit: 2-3 servings a day ? Vegetables: 4-5 servings a day ? Low-fat dairy: 2 servings a day ? Fish, poultry, or lean meat: 1 serving a day ? Beans and legumes: 2 or more servings a week ? Nuts and seeds: 1-2 servings a day ? Whole grains: 6-8 servings a day ? Extra-virgin olive oil: 3-4 servings a day  Limit red meat and sweets to only a few servings a month   What are my food choices?  Mediterranean diet ? Recommended  Grains:  Whole-grain pasta. Brown rice. Bulgar wheat. Polenta. Couscous. Whole-wheat bread. Orpah Cobb.  Vegetables: Artichokes. Beets. Broccoli. Cabbage. Carrots. Eggplant. Green beans. Chard. Kale. Spinach. Onions. Leeks. Peas. Squash. Tomatoes. Peppers. Radishes.  Fruits: Apples. Apricots. Avocado. Berries. Bananas. Cherries. Dates. Figs. Grapes. Lemons. Melon. Oranges. Peaches. Plums. Pomegranate.  Meats and other protein foods: Beans. Almonds. Sunflower seeds. Pine nuts. Peanuts. Cod. Salmon. Scallops. Shrimp. Tuna. Tilapia. Clams. Oysters. Eggs.  Dairy: Low-fat milk. Cheese. Greek yogurt.  Beverages: Water. Red wine. Herbal tea.  Fats and oils: Extra virgin olive oil. Avocado oil. Grape seed oil.  Sweets and desserts: [place] yogurt with honey. Baked apples. Poached pears. Trail mix.  Seasoning and other foods: Basil. Cilantro. Coriander. Cumin. Mint. Parsley. Sage. Rosemary. Tarragon. Garlic. Oregano. Thyme. Pepper. Balsalmic vinegar. Tahini. Hummus. Tomato sauce. Olives. Mushrooms. ? Limit these  Grains: Prepackaged pasta or rice dishes. Prepackaged cereal with added sugar.  Vegetables: Deep fried potatoes (french fries).  Fruits: Fruit canned in syrup.  Meats and other protein foods: Beef. Pork. Lamb. Poultry with skin. Hot dogs. Tomasa Blase.  Dairy: Ice cream. Sour cream. Whole milk.  Beverages: Juice. Sugar-sweetened soft drinks. Beer. Liquor and spirits.  Fats and oils: Butter. Canola oil. Vegetable oil. Beef fat (tallow). Lard.  Sweets and desserts: Cookies. Cakes. Pies. Candy.  Seasoning and other foods: Mayonnaise. Premade sauces and marinades. The items listed may not be a complete list. Talk with your dietitian about what dietary choices are right for you. Summary  The Mediterranean diet includes both food and lifestyle choices.  Eat a variety of fresh fruits and vegetables, beans, nuts, seeds, and whole grains.  Limit the amount of red meat and sweets that you  eat.  Talk with your health care provider about whether it is safe for you to drink red wine in moderation. This means 1 glass a day for nonpregnant women and 2 glasses a day for men. A glass of wine equals 5 oz (150 mL). This information is not intended to replace advice given to you by your health care provider. Make sure you discuss any questions you have with your health care provider. Document Revised: 02/14/2016 Document Reviewed: 02/07/2016 Elsevier Patient Education  2020 [employer].

## 2020-10-16 ENCOUNTER — Ambulatory Visit (INDEPENDENT_AMBULATORY_CARE_PROVIDER_SITE_OTHER): Payer: No Typology Code available for payment source

## 2020-10-16 DIAGNOSIS — Z95810 Presence of automatic (implantable) cardiac defibrillator: Secondary | ICD-10-CM

## 2020-10-16 DIAGNOSIS — I5022 Chronic systolic (congestive) heart failure: Secondary | ICD-10-CM | POA: Diagnosis not present

## 2020-10-19 ENCOUNTER — Telehealth: Payer: Self-pay

## 2020-10-19 NOTE — Progress Notes (Signed)
EPIC Encounter for ICM Monitoring  Patient Name: Dalton Porter is a 65 y.o. male Date: 10/19/2020 [facility] Primary Cardiologist:Branch Electrophysiologist:Allred 10/08/2020 OfficeWeight:241lbs   Attempted call to patient and unable to reach. Transmission reviewed.  CorvueThoracic impedancesuggesting normal fluid levels.  Prescribed:   Furosemide20 mgtake2 tablets (40 mgtotal)daily.  Spironolactone 25 mg take 1 tablet daily  Labs: 05/30/2020 Creatinine1.30, BUN21, Potassium4.5, Sodium136, GFR>60  05/28/2020 Creatinine1.40, BUN21, Potassium4.8, Sodium139, [id number] A complete set of results can be found in Results Review.  Recommendations:Unable to reach.  Follow-up plan: ICM clinic phone appointment on6/14/2022. 91 day device clinic remote transmission6/13/2022.  EP/Cardiology Office Visits:4/25/2022with Dr Wyline Mood.   05/31/2021 with Dr Johney Frame.  Copy of ICM check sent to Dr.Allred.  3 month ICM trend: 10/16/2020.    1 Year ICM trend:       Karie Soda, RN 10/19/2020 11:48 AM

## 2020-10-19 NOTE — Telephone Encounter (Signed)
Remote ICM transmission received.  Attempted call to patient regarding ICM remote transmission and left message per DPR to return call.   

## 2020-10-22 ENCOUNTER — Encounter: Payer: Self-pay | Admitting: *Deleted

## 2020-10-22 ENCOUNTER — Encounter: Payer: Self-pay | Admitting: Cardiology

## 2020-10-22 ENCOUNTER — Ambulatory Visit (INDEPENDENT_AMBULATORY_CARE_PROVIDER_SITE_OTHER): Payer: No Typology Code available for payment source | Admitting: Cardiology

## 2020-10-22 VITALS — BP 100/60 | HR 70 | Ht 71.0 in | Wt 244.0 lb

## 2020-10-22 DIAGNOSIS — R0789 Other chest pain: Secondary | ICD-10-CM | POA: Diagnosis not present

## 2020-10-22 DIAGNOSIS — I5022 Chronic systolic (congestive) heart failure: Secondary | ICD-10-CM | POA: Diagnosis not present

## 2020-10-22 NOTE — Patient Instructions (Signed)
Your physician recommends that you schedule a follow-up appointment in: 4 MONTHS WITH DR BRANCH  Your physician recommends that you continue on your current medications as directed. Please refer to the Current Medication list given to you today.  Thank you for choosing [facility]!!    

## 2020-10-22 NOTE — Progress Notes (Signed)
Clinical Summary Mr. Thorington is a 65 y.o.male seen today for follow up of the following medical problems.   1. Chest pain - admit 03/2016 with chest pain.  - cath 03/2016 with normal coronaries.  - 03/2016 echo LVEF 40-45% - cardiac MRI 04/2016 without evidence of infiltrative or inflammatory process. LVEF by MRI 33%, diffuse hypokinesis.  05/2019 coronary CTA: LAD,OM1,OM2 disease not significant by FFR.   - no recent symptoms.    2. Chronic systoilc HF - echo 03/2016 LVEF 40-45%, recent cath patent coronaries - medical therapy limited by soft bp's and Wenchebach heart block - from discharge notes concern about possible infiltratrive process. cardiac MRI 04/2016 without evidence of infiltrative or inflammatory process. LVEF by MRI 33%, diffuse hypokinesis.    - [facility] 30%, grade II diastolic dysfunction - ICD [facility] January 26, 2018 at [facility], followed by Dr Johney Frame  04/2020 echo: LVEF 40-45%   - last visit we increased entresto to 49/51mg  bid - some recent DOE, mild change - no recent edema - home weights stable around 240-242 lbs - compliant with meds - reports [facility] lowered aldactone due to increased potassium.  - normal device check  3. OSA screen - +snoring, can some have apneic episodes. Occasional daytime somnolence. - has not been interested in sleep study  -limited interest in sleep study.   4. History of CVA - he is full dose ASA due to prior CVA.      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    Past Medical History:  Diagnosis Date  . Back pain, chronic   . CHF (congestive heart failure) (HCC)   . Diabetes mellitus   . Nonischemic cardiomyopathy (HCC)    a. EF 40% by echo in 2003 b. 03/2016: cath showing normal cors and EF of 25-35%, EF 40-45% by echo with diffuse hypokinesis.  . Stroke (HCC)      Allergies  Allergen Reactions  . 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 Codeine Rash    headache     Current Outpatient Medications  Medication Sig Dispense Refill  . aspirin 325 MG tablet Take 325 mg by mouth daily.    [name] atorvastatin (LIPITOR) 20 MG tablet Take 1 tablet (20 mg total) by mouth daily. 90 tablet 3  . Cholecalciferol (VITAMIN D3) 5000 units CAPS Take 1 capsule by mouth daily.    . furosemide (LASIX) 20 MG tablet Take 2 tablets (40 mg total) by mouth daily. 270 tablet 0  . gabapentin (NEURONTIN) 300 MG capsule Take 300 mg by mouth 3 (three) times daily.    [name] glipiZIDE (GLUCOTROL) 5 MG tablet Take 5 mg by mouth 2 (two) times daily before a meal.    . metFORMIN (GLUCOPHAGE) 500 MG tablet Take 500 mg by mouth 2 (two) times daily with a meal.    . metoprolol succinate (TOPROL XL) 25 MG 24 hr tablet Take 1 tablet (25 mg total) by mouth 2 (two) times daily. 45 tablet 0  . nitroGLYCERIN (NITROSTAT) 0.4 MG SL tablet Place 1 tablet (0.4 mg total) under the tongue every 5 (five) minutes as needed for chest pain. 25 tablet 3  . sacubitril-valsartan (ENTRESTO) 49-51 MG Take 1 tablet by mouth 2 (two) times daily. 60 tablet 3  . spironolactone (ALDACTONE) 25 MG tablet Take  25 mg by mouth daily. [facility] and pt reports on 11/30/2018 that he takes daily.    . TRAZODONE HCL PO Take 100 mg by mouth at bedtime.     . vitamin B-12 (CYANOCOBALAMIN) 500 MCG tablet Take 1 tablet (500 mcg total) by mouth daily. 30 tablet 1   No current facility-administered medications for this visit.     Past Surgical History:  Procedure Laterality Date  . ANKLE SURGERY    . APPENDECTOMY    . BACK SURGERY     x3  . CARDIAC CATHETERIZATION  2002  . CARDIAC CATHETERIZATION N/A 04/08/2016   Procedure: Left Heart Cath and Coronary Angiography;  Surgeon: Peter M Swaziland, MD;  Location: [facility] INVASIVE CV LAB;  Service: Cardiovascular;  Laterality: N/A;  . CHOLECYSTECTOMY    . ELBOW  SURGERY    . ICD IMPLANT  01/26/2018   SJM Fortify Assura VR ICD implanted by Dr Marily Memos for primary prevention of sudden death  . THUMB FUSION       Allergies  Allergen Reactions  . 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 Codeine Rash    headache      Family History  Problem Relation Age of Onset  . Stroke Other   . Pancreatic cancer Father   . Healthy Sister   . Other Brother        died in a fire  . Leukemia Maternal Grandmother   . Healthy Sister      Social History Mr. Omary reports that he has never smoked. He has never used smokeless tobacco. Mr. Nigh reports no history of alcohol use.   Review of Systems CONSTITUTIONAL: No weight loss, fever, chills, weakness or fatigue.  HEENT: Eyes: No visual loss, blurred vision, double vision or yellow sclerae.No hearing loss, sneezing, congestion, runny nose or sore throat.  SKIN: No rash or itching.  CARDIOVASCULAR: per hpi RESPIRATORY: No shortness of breath, cough or sputum.  GASTROINTESTINAL: No anorexia, nausea, vomiting or diarrhea. No abdominal pain or blood.  GENITOURINARY: No burning on urination, no polyuria NEUROLOGICAL: No headache, dizziness, syncope, paralysis, ataxia, numbness or tingling in the extremities. No change in bowel or bladder control.  MUSCULOSKELETAL: No muscle, back pain, joint pain or stiffness.  LYMPHATICS: No enlarged nodes. No history of splenectomy.  PSYCHIATRIC: No history of depression or anxiety.  ENDOCRINOLOGIC: No reports of sweating, cold or heat intolerance. No polyuria or polydipsia.  [name]   Physical Examination Today's Vitals   10/22/20 1016  BP: 100/60  Pulse: 70  SpO2: 98%  Weight: 244 lb (110.7 kg)  Height: 5\' 11"  (1.803 m)   Body mass index is 34.03 kg/m.  Gen: resting comfortably, no acute distress HEENT: no scleral icterus, pupils equal round and reactive, no  palptable cervical adenopathy,  CV: RRR, no m/r/g no jvd Resp: Clear to auscultation bilaterally GI: abdomen is soft, non-tender, non-distended, normal bowel sounds, no hepatosplenomegaly MSK: extremities are warm, no edema.  Skin: warm, no rash Neuro:  no focal deficits Psych: appropriate affect   Diagnostic Studies 03/2016 cath  There is severe left ventricular systolic dysfunction.  LV end diastolic pressure is mildly elevated.  The left ventricular ejection fraction is 25-35% by visual estimate.  1. Normal coronary anatomy 2. Severe LV dysfunction- global 3. Mildly elevated LVEDP  Plan: optimize medical therapy for CHF.  03/2016 echo Study  Conclusions  - Left ventricle: The cavity size was normal. There was mild focal basal hypertrophy of the septum. Systolic function was mildly to moderately reduced. The estimated ejection fraction was in the range of 40% to 45%. Diffuse hypokinesis. Doppler parameters are consistent with abnormal left ventricular relaxation (grade 1 diastolic dysfunction). - Mitral valve: There was mild regurgitation. - Left atrium: The atrium was mildly dilated. - Pulmonary arteries: Systolic pressure was mildly increased.   04/2016 Cardiac MRI IMPRESSION: 1. Moderately dilated left ventricle with normal wall thickness and moderately decreased systolic function (LVEF = 33%) with diffuse hypokinesis.  There is no late gadolinium enhancement.  There is no evidence for infiltrative or inflammatory myocardial disease. 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  2. Normal right ventricular size, thickness and systolic function (RVEF = 50%) with no wall motion abnormalities.  3. Moderately dilated left atrium (45 mm).  4. Normal size of the aortic root, ascending aorta and pulmonary artery.  5. Mild mitral and tricuspid regurgitation.  03/2016 Event monitor  Telemetry  tracings show primarily sinus rhythm with occasional first degree and second degree Mobitz I Aurora Mask) block, occasional PVCs  Reported symptoms correlate with sinus rhtyhm with occasional PVCs   11/2017 [facility] Ziopatch Wenchebach block noted. NSVT up to 19 beats.   [facility] LVEF 30%       Assessment and Plan  1. Chronic systolic HF -no symptoms. Soft bp's today, would not titrate meds further. Recent elevated potassium per his report, his pcp has cut back his aldactone with upcoming repeat labs - I have given the patient a note to pass to his pcp at the [place] to consider jardiance or farxiga for his diabetes in setting of his systolic HF  2. Chest pain - fairly chronic symptoms, CTA last year without significant disease -we will continue to monitor.       Antoine Poche, M.D.

## 2020-12-10 ENCOUNTER — Ambulatory Visit (INDEPENDENT_AMBULATORY_CARE_PROVIDER_SITE_OTHER): Payer: No Typology Code available for payment source

## 2020-12-10 DIAGNOSIS — I428 Other cardiomyopathies: Secondary | ICD-10-CM | POA: Diagnosis not present

## 2020-12-11 ENCOUNTER — Ambulatory Visit (INDEPENDENT_AMBULATORY_CARE_PROVIDER_SITE_OTHER): Payer: No Typology Code available for payment source

## 2020-12-11 DIAGNOSIS — Z95810 Presence of automatic (implantable) cardiac defibrillator: Secondary | ICD-10-CM

## 2020-12-11 DIAGNOSIS — I5022 Chronic systolic (congestive) heart failure: Secondary | ICD-10-CM

## 2020-12-11 LAB — CUP PACEART REMOTE DEVICE CHECK
Battery Remaining Longevity: 90 mo
Battery Remaining Percentage: 77 %
Battery Voltage: 3.01 V
Brady Statistic RV Percent Paced: 7.1 %
Date Time Interrogation Session: 20220613162400
HighPow Impedance: 75 Ohm
HighPow Impedance: 75 Ohm
Implantable Lead Implant Date: 20190730
Implantable Lead Location: 753860
Implantable Pulse Generator Implant Date: 20190730
Lead Channel Impedance Value: 360 Ohm
Lead Channel Pacing Threshold Amplitude: 0.75 V
Lead Channel Pacing Threshold Pulse Width: 0.5 ms
Lead Channel Sensing Intrinsic Amplitude: 11.5 mV
Lead Channel Setting Pacing Amplitude: 2.5 V
Lead Channel Setting Pacing Pulse Width: 0.5 ms
Lead Channel Setting Sensing Sensitivity: 0.5 mV
Pulse Gen Serial Number: 9839597

## 2020-12-14 ENCOUNTER — Telehealth: Payer: Self-pay

## 2020-12-14 NOTE — Telephone Encounter (Signed)
Remote ICM transmission received.  Attempted call to patient regarding ICM remote transmission and left detailed message per DPR.  Advised to return call for any fluid symptoms or questions. Next ICM remote transmission scheduled 12/24/2020.     

## 2020-12-14 NOTE — Progress Notes (Signed)
EPIC Encounter for ICM Monitoring  Patient Name: Dalton Porter is a 65 y.o. male Date: 12/14/2020 Primary Care Physican: Wallace Cullens, MD Primary Cardiologist: Branch Electrophysiologist: Allred 10/08/2020 Office Weight: 241 lbs     Attempted call to patient and unable to reach.  Left detailed message per DPR regarding transmission. Transmission reviewed.    Corvue Thoracic impedance suggesting possible fluid accumulation but trending close to baseline.    Prescribed: Furosemide 20 mg take 2 tablets (40 mg total) daily. Spironolactone 25 mg take 0.5 tablet (12.5 mg total) daily   Labs: 05/30/2020 Creatinine 1.30, BUN 21, Potassium 4.5, Sodium 136, GFR >60 05/28/2020 Creatinine 1.40, BUN 21, Potassium 4.8, Sodium 139, GFR 59  A complete set of results can be found in Results Review.   Recommendations: Left voice mail with ICM number and encouraged to call if experiencing any fluid symptoms.    Follow-up plan: ICM clinic phone appointment on 12/24/2020 to recheck fluid levels.  91 day device clinic remote transmission 03/11/2021.     EP/Cardiology Office Visits: 02/27/2021 with Dr Wyline Mood.   05/31/2021 with Dr Johney Frame.    Copy of ICM check sent to Dr. Johney Frame.  3 month ICM trend: 12/11/2020.    1 Year ICM trend:       Karie Soda, RN 12/14/2020 9:39 AM

## 2020-12-24 ENCOUNTER — Ambulatory Visit (INDEPENDENT_AMBULATORY_CARE_PROVIDER_SITE_OTHER): Payer: No Typology Code available for payment source

## 2020-12-24 DIAGNOSIS — Z95810 Presence of automatic (implantable) cardiac defibrillator: Secondary | ICD-10-CM

## 2020-12-24 DIAGNOSIS — I5022 Chronic systolic (congestive) heart failure: Secondary | ICD-10-CM

## 2020-12-25 ENCOUNTER — Telehealth: Payer: Self-pay

## 2020-12-25 NOTE — Progress Notes (Signed)
EPIC Encounter for ICM Monitoring  Patient Name: Dalton Porter is a 65 y.o. male Date: 12/25/2020 Primary Care Physican: Wallace Cullens, MD Primary Cardiologist: Branch Electrophysiologist: Allred 10/08/2020 Office Weight: 241 lbs     Attempted call to patient and unable to reach.  Left detailed message per DPR regarding transmission. Transmission reviewed.   Corvue Thoracic impedance normal but was suggesting possible fluid accumulation from 6/9-6/20 and 6/22-6/26.    Prescribed: Furosemide 20 mg take 2 tablets (40 mg total) daily. Spironolactone 25 mg take 0.5 tablet (12.5 mg total) daily   Labs: 05/30/2020 Creatinine 1.30, BUN 21, Potassium 4.5, Sodium 136, GFR >60 05/28/2020 Creatinine 1.40, BUN 21, Potassium 4.8, Sodium 139, GFR 59  A complete set of results can be found in Results Review.   Recommendations: Left voice mail with ICM number and encouraged to call if experiencing any fluid symptoms.   Follow-up plan: ICM clinic phone appointment on 01/28/2021.  91 day device clinic remote transmission 03/11/2021.     EP/Cardiology Office Visits: 02/27/2021 with Dr Wyline Mood.   05/31/2021 with Dr Johney Frame.    Copy of ICM check sent to Dr. Johney Frame.  3 month ICM trend: 12/24/2020.    1 Year ICM trend:       Karie Soda, RN 12/25/2020 4:05 PM

## 2020-12-25 NOTE — Telephone Encounter (Signed)
Remote ICM transmission received.  Attempted call to patient regarding ICM remote transmission and left detailed message per DPR.  Advised to return call for any fluid symptoms or questions. Next ICM remote transmission scheduled 01/28/2021.       

## 2021-01-01 NOTE — Progress Notes (Signed)
Remote ICD transmission.   

## 2021-01-18 ENCOUNTER — Telehealth: Payer: Self-pay | Admitting: Cardiology

## 2021-01-18 NOTE — Telephone Encounter (Signed)
I will forward to our device clinic to assist patient with specific information he needs.

## 2021-01-18 NOTE — Telephone Encounter (Signed)
Patient called said he will be going to a Diplomatic Services operational officer. 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 He can be reached at [phone].

## 2021-01-18 NOTE — Telephone Encounter (Signed)
Returned phone call, spoke with pt wife, Dondra Spry (DPR on file).  Advised that pt should show [employer] ID card.  Pt can go through standard metal detectors.

## 2021-01-28 ENCOUNTER — Ambulatory Visit (INDEPENDENT_AMBULATORY_CARE_PROVIDER_SITE_OTHER): Payer: No Typology Code available for payment source

## 2021-01-28 DIAGNOSIS — I5022 Chronic systolic (congestive) heart failure: Secondary | ICD-10-CM

## 2021-01-28 DIAGNOSIS — Z95810 Presence of automatic (implantable) cardiac defibrillator: Secondary | ICD-10-CM | POA: Diagnosis not present

## 2021-01-30 ENCOUNTER — Telehealth: Payer: Self-pay

## 2021-01-30 NOTE — Progress Notes (Signed)
EPIC Encounter for ICM Monitoring  Patient Name: Dalton Porter is a 65 y.o. male Date: 01/30/2021 Primary Care Physican: Wallace Cullens, MD Primary Cardiologist: Branch Electrophysiologist: Allred 10/08/2020 Office Weight: 241 lbs     Attempted call to patient and unable to reach.  Left detailed message per DPR regarding transmission. Transmission reviewed.    Corvue Thoracic impedance normal but was suggesting possible fluid accumulation from 7/22-7/25.    Prescribed: Furosemide 20 mg take 2 tablets (40 mg total) daily. Spironolactone 25 mg take 0.5 tablet (12.5 mg total) daily   Labs: 05/30/2020 Creatinine 1.30, BUN 21, Potassium 4.5, Sodium 136, GFR >60 05/28/2020 Creatinine 1.40, BUN 21, Potassium 4.8, Sodium 139, GFR 59  A complete set of results can be found in Results Review.   Recommendations: Left voice mail with ICM number and encouraged to call if experiencing any fluid symptoms.    Follow-up plan: ICM clinic phone appointment on 03/12/2021.  91 day device clinic remote transmission 03/11/2021.     EP/Cardiology Office Visits: 02/27/2021 with Dr Wyline Mood.   05/31/2021 with Dr Johney Frame.    Copy of ICM check sent to Dr. Johney Frame.   3 month ICM trend: 01/28/2021.    1 Year ICM trend:       Karie Soda, RN 01/30/2021 2:44 PM

## 2021-01-30 NOTE — Telephone Encounter (Signed)
Remote ICM transmission received.  Attempted call to patient regarding ICM remote transmission and left detailed message per DPR.  Advised to return call for any fluid symptoms or questions. Next ICM remote transmission scheduled 03/12/2021.    

## 2021-02-27 ENCOUNTER — Encounter: Payer: Self-pay | Admitting: *Deleted

## 2021-02-27 ENCOUNTER — Other Ambulatory Visit: Payer: Self-pay

## 2021-02-27 ENCOUNTER — Encounter: Payer: Self-pay | Admitting: Cardiology

## 2021-02-27 ENCOUNTER — Ambulatory Visit (INDEPENDENT_AMBULATORY_CARE_PROVIDER_SITE_OTHER): Payer: No Typology Code available for payment source | Admitting: Cardiology

## 2021-02-27 VITALS — BP 100/56 | HR 68 | Ht 71.0 in | Wt 241.6 lb

## 2021-02-27 DIAGNOSIS — I5022 Chronic systolic (congestive) heart failure: Secondary | ICD-10-CM

## 2021-02-27 DIAGNOSIS — R0789 Other chest pain: Secondary | ICD-10-CM

## 2021-02-27 NOTE — Progress Notes (Signed)
Clinical Summary Dalton Porter is a 65 y.o.maleseen today for follow up of the following medical problems.    1. Chest pain - admit 03/2016 with chest pain.  - cath 03/2016 with normal coronaries.  - 03/2016 echo LVEF 40-45% - cardiac MRI 04/2016 without evidence of infiltrative or inflammatory process. LVEF by MRI 33%, diffuse hypokinesis.    05/2019 coronary CTA: LAD,OM1,OM2 disease not significant by FFR.     - infrequent nonexertional left sided chest pains at times.      2. Chronic systoilc HF - echo 03/2016 LVEF 40-45%, recent cath patent coronaries - medical therapy limited by soft bp's and Wenchebach heart block - from discharge notes concern about possible infiltratrive process. cardiac MRI 04/2016 without evidence of infiltrative or inflammatory process. LVEF by MRI 33%, diffuse hypokinesis.      - [facility] LVEF 30%, grade II diastolic dysfunction - ICD placed January 26, 2018 at [facility] in  [place], followed by Dr Johney Frame    04/2020 echo: LVEF 40-45%      - reports [facility] lowered aldactone due to increased potassium.  - [facility] with [facility], he is to discuss farxiga or jardiance with is [place] provider who also manages his diabetes.   - no recent edema. Mild SOB is stable.      3. OSA screen - +snoring, can some have apneic episodes. Occasional daytime somnolence.  - has not been interested in sleep study   -limited interest in sleep study.     4. History of CVA - he is full dose ASA due to prior CVA.          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    Past Medical History:  Diagnosis Date   Back pain, chronic    CHF (congestive heart failure) (HCC)    Diabetes mellitus    Nonischemic cardiomyopathy (HCC)    a. EF 40% by echo in 2003 b. 03/2016: cath showing normal cors and EF of 25-35%, EF 40-45% by echo with diffuse hypokinesis.   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     atorvastatin (LIPITOR) 20 MG tablet Take 20 mg by mouth daily.     Cholecalciferol (VITAMIN D3) 5000 units CAPS Take 1 capsule by mouth daily.     furosemide (LASIX) 20 MG tablet Take 2 tablets (40 mg total) by mouth daily. 270 tablet 0   gabapentin (NEURONTIN) 300 MG capsule Take 300 mg by mouth 3 (three) times daily.     glipiZIDE (GLUCOTROL) 5 MG tablet Take 5 mg by mouth 2 (two) times daily before a meal.     metFORMIN (GLUCOPHAGE) 500 MG tablet Take 500 mg by mouth 2 (two) times daily with a meal.     metoprolol succinate (TOPROL XL) 25 MG 24 hr tablet Take 1 tablet (25 mg total) by mouth 2 (two) times daily. 45 tablet 0   nitroGLYCERIN (NITROSTAT) 0.4 MG SL tablet Place 1 tablet (0.4 mg total) under the tongue every 5 (five) minutes as needed for chest pain. 25 tablet 3  sacubitril-valsartan (ENTRESTO) 49-51 MG Take 1 tablet by mouth 2 (two) times daily. 60 tablet 3   spironolactone (ALDACTONE) 25 MG tablet Take 12.5 mg by mouth daily.     TRAZODONE HCL PO Take 100 mg by mouth at bedtime.      vitamin B-12 (CYANOCOBALAMIN) 500 MCG tablet Take 1 tablet (500 mcg total) by mouth daily. 30 tablet 1   No current facility-administered medications for this visit.     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 Kopko reports that he has never smoked. He has never used smokeless tobacco. Mr. Wiebelhaus reports no history of alcohol use.   Review of Systems CONSTITUTIONAL: No weight loss, fever, chills, weakness or fatigue.  HEENT: Eyes: No visual loss, blurred vision, double vision or yellow sclerae.No hearing loss, sneezing, congestion, runny nose or sore throat.  SKIN: No rash or itching.  CARDIOVASCULAR: per hpi RESPIRATORY: No shortness of breath, cough or sputum.  GASTROINTESTINAL: No anorexia, nausea, vomiting or diarrhea. No abdominal pain or blood.  GENITOURINARY: No burning on urination, no polyuria NEUROLOGICAL: No headache, dizziness, syncope, paralysis, ataxia, numbness or tingling in the extremities. No change in bowel or bladder control.  MUSCULOSKELETAL: No muscle, back pain, joint pain or stiffness.  LYMPHATICS: No enlarged nodes. No history of splenectomy.  PSYCHIATRIC: No history of depression or anxiety.  ENDOCRINOLOGIC: No reports of sweating, cold or heat intolerance. No polyuria or polydipsia.  [name]   Physical Examination Today's Vitals   02/27/21 1039  BP: (!) 100/56  Pulse: 68  SpO2: 97%  Weight: 241 lb 9.6 oz (109.6 kg)  Height: 5\' 11"  (1.803 m)   Body mass index is 33.7 kg/m.  Gen: resting comfortably, no acute distress HEENT: no scleral icterus, pupils equal round and reactive, no palptable cervical adenopathy,  CV: RRR, no mr/g no jvd Resp: Clear to auscultation bilaterally GI: abdomen  is soft, non-tender, non-distended, normal bowel sounds, no hepatosplenomegaly MSK: extremities are warm, no edema.  Skin: warm, no rash Neuro:  no focal deficits Psych: appropriate affect   Diagnostic Studies 03/2016 cath There is severe left ventricular systolic dysfunction. LV end diastolic pressure is mildly elevated. The left ventricular ejection fraction is 25-35% by visual estimate.   1. Normal coronary anatomy 2. Severe LV dysfunction- global 3. Mildly  elevated LVEDP   Plan: optimize medical therapy for CHF.   03/2016 echo Study Conclusions   - Left ventricle: The cavity size was normal. There was mild focal   basal hypertrophy of the septum. Systolic function was mildly to   moderately reduced. The estimated ejection fraction was in the   range of 40% to 45%. Diffuse hypokinesis. Doppler parameters are   consistent with abnormal left ventricular relaxation (grade 1   diastolic dysfunction). - Mitral valve: There was mild regurgitation. - Left atrium: The atrium was mildly dilated. - Pulmonary arteries: Systolic pressure was mildly increased.     04/2016 Cardiac MRI IMPRESSION: 1. Moderately dilated left ventricle with normal wall thickness and moderately decreased systolic function (LVEF = 33%) with diffuse hypokinesis.   There is no late gadolinium enhancement.   There is no evidence for infiltrative or inflammatory myocardial disease. 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   2. Normal right ventricular size, thickness and systolic function (RVEF = 50%) with no wall motion abnormalities.   3.  Moderately dilated left atrium (45 mm).   4. Normal size of the aortic root, ascending aorta and pulmonary artery.   5.  Mild mitral and tricuspid regurgitation.   03/2016 Event monitor Telemetry tracings show primarily sinus rhythm with occasional first degree and second degree Mobitz I Aurora Mask) block,  occasional PVCs Reported symptoms correlate with sinus rhtyhm with occasional PVCs     11/2017 [facility] Ziopatch Wenchebach block noted. NSVT up to 19 beats.    12/2017 [facility] echo LVEF 30%    Assessment and Plan   1. Chronic systolic HF -no recent symptoms, weights are stable - he is discuss with [facility] who also manages his diabetes possibly starting jardiance or farxiga - soft bp's limit titration of other meds at this time.    2. Chest pain - fairly chronic symptoms, CTA last year without significant disease -continue to monitor  3. Fatigue - we discussed again a possible sleep study, he will discuss with his pcp   F/u 6 months  Antoine Poche, M.D.

## 2021-02-27 NOTE — Patient Instructions (Signed)
Medication Instructions:  Your physician recommends that you continue on your current medications as directed. Please refer to the Current Medication list given to you today.  Labwork: none  Testing/Procedures: none  Follow-Up: Your physician recommends that you schedule a follow-up appointment in: 6 month  Any Other Special Instructions Will Be Listed Below (If Applicable).  If you need a refill on your cardiac medications before your next appointment, please call your pharmacy. 

## 2021-03-11 ENCOUNTER — Ambulatory Visit (INDEPENDENT_AMBULATORY_CARE_PROVIDER_SITE_OTHER): Payer: No Typology Code available for payment source

## 2021-03-11 DIAGNOSIS — I428 Other cardiomyopathies: Secondary | ICD-10-CM

## 2021-03-12 ENCOUNTER — Ambulatory Visit (INDEPENDENT_AMBULATORY_CARE_PROVIDER_SITE_OTHER): Payer: No Typology Code available for payment source

## 2021-03-12 DIAGNOSIS — Z95810 Presence of automatic (implantable) cardiac defibrillator: Secondary | ICD-10-CM | POA: Diagnosis not present

## 2021-03-12 DIAGNOSIS — I5022 Chronic systolic (congestive) heart failure: Secondary | ICD-10-CM

## 2021-03-12 LAB — CUP PACEART REMOTE DEVICE CHECK
Battery Remaining Longevity: 88 mo
Battery Remaining Percentage: 76 %
Battery Voltage: 2.99 V
Brady Statistic RV Percent Paced: 7.4 %
Date Time Interrogation Session: 20220912165530
HighPow Impedance: 80 Ohm
HighPow Impedance: 80 Ohm
Implantable Lead Implant Date: 20190730
Implantable Lead Location: 753860
Implantable Pulse Generator Implant Date: 20190730
Lead Channel Impedance Value: 340 Ohm
Lead Channel Pacing Threshold Amplitude: 0.75 V
Lead Channel Pacing Threshold Pulse Width: 0.5 ms
Lead Channel Sensing Intrinsic Amplitude: 11.5 mV
Lead Channel Setting Pacing Amplitude: 2.5 V
Lead Channel Setting Pacing Pulse Width: 0.5 ms
Lead Channel Setting Sensing Sensitivity: 0.5 mV
Pulse Gen Serial Number: 9839597

## 2021-03-15 ENCOUNTER — Telehealth: Payer: Self-pay

## 2021-03-15 NOTE — Telephone Encounter (Signed)
Remote ICM transmission received.  Attempted call to patient regarding ICM remote transmission and left detailed message per DPR.  Advised to return call for any fluid symptoms or questions. Next ICM remote transmission scheduled 04/15/2021.    

## 2021-03-15 NOTE — Progress Notes (Signed)
EPIC Encounter for ICM Monitoring  Patient Name: Dalton Porter is a 65 y.o. male Date: 03/15/2021 Primary Care Physican: Wallace Cullens, MD Primary Cardiologist: Branch Electrophysiologist: Allred 10/08/2020 Office Weight: 241 lbs     Attempted call to patient and unable to reach.  Left detailed message per DPR regarding transmission. Transmission reviewed.    Corvue Thoracic impedance normal on transmission date but was suggesting possible fluid accumulation from 8/25-8/29, 9/2-9/6.    Prescribed: Furosemide 20 mg take 2 tablets (40 mg total) daily. Spironolactone 25 mg take 0.5 tablet (12.5 mg total) daily   Labs: 05/30/2020 Creatinine 1.30, BUN 21, Potassium 4.5, Sodium 136, GFR >60 05/28/2020 Creatinine 1.40, BUN 21, Potassium 4.8, Sodium 139, GFR 59  A complete set of results can be found in Results Review.   Recommendations: Left voice mail with ICM number and encouraged to call if experiencing any fluid symptoms.    Follow-up plan: ICM clinic phone appointment on 04/15/2021.  91 day device clinic remote transmission 03/11/2021.     EP/Cardiology Office Visits: 08/29/2021 with Dr Wyline Mood.   06/07/2021 with Dr Johney Frame.    Copy of ICM check sent to Dr. Johney Frame.    3 month ICM trend: 03/11/2021.    1 Year ICM trend:       Karie Soda, RN 03/15/2021 1:55 PM

## 2021-03-19 NOTE — Progress Notes (Signed)
Remote ICD transmission.   

## 2021-04-04 ENCOUNTER — Emergency Department (HOSPITAL_COMMUNITY)
Admission: EM | Admit: 2021-04-04 | Discharge: 2021-04-04 | Disposition: A | Discharge status: Home / Self Care | Payer: No Typology Code available for payment source | Attending: Emergency Medicine | Admitting: Emergency Medicine

## 2021-04-04 ENCOUNTER — Emergency Department (HOSPITAL_COMMUNITY): Payer: No Typology Code available for payment source

## 2021-04-04 ENCOUNTER — Encounter (HOSPITAL_COMMUNITY): Payer: Self-pay | Admitting: *Deleted

## 2021-04-04 ENCOUNTER — Other Ambulatory Visit: Payer: Self-pay

## 2021-04-04 DIAGNOSIS — Z7984 Long term (current) use of oral hypoglycemic drugs: Secondary | ICD-10-CM | POA: Insufficient documentation

## 2021-04-04 DIAGNOSIS — Z79899 Other long term (current) drug therapy: Secondary | ICD-10-CM | POA: Diagnosis not present

## 2021-04-04 DIAGNOSIS — R21 Rash and other nonspecific skin eruption: Secondary | ICD-10-CM

## 2021-04-04 DIAGNOSIS — Z91010 Allergy to peanuts: Secondary | ICD-10-CM | POA: Diagnosis not present

## 2021-04-04 DIAGNOSIS — Z7982 Long term (current) use of aspirin: Secondary | ICD-10-CM | POA: Insufficient documentation

## 2021-04-04 DIAGNOSIS — R1031 Right lower quadrant pain: Secondary | ICD-10-CM | POA: Insufficient documentation

## 2021-04-04 DIAGNOSIS — E119 Type 2 diabetes mellitus without complications: Secondary | ICD-10-CM | POA: Diagnosis not present

## 2021-04-04 DIAGNOSIS — I110 Hypertensive heart disease with heart failure: Secondary | ICD-10-CM | POA: Insufficient documentation

## 2021-04-04 DIAGNOSIS — L089 Local infection of the skin and subcutaneous tissue, unspecified: Secondary | ICD-10-CM | POA: Insufficient documentation

## 2021-04-04 DIAGNOSIS — I509 Heart failure, unspecified: Secondary | ICD-10-CM | POA: Diagnosis not present

## 2021-04-04 DIAGNOSIS — T148XXA Other injury of unspecified body region, initial encounter: Secondary | ICD-10-CM

## 2021-04-04 MED ORDER — HYDROXYZINE HCL 25 MG PO TABS: 25.0000 mg | ORAL_TABLET | Freq: Four times a day (QID) | ORAL | 0 refills | Status: DC | PRN

## 2021-04-04 MED ORDER — CEPHALEXIN 500 MG PO CAPS
500.0000 mg | ORAL_CAPSULE | Freq: Four times a day (QID) | ORAL | 0 refills | Status: DC
Start: 2021-04-04 — End: 2021-06-14

## 2021-04-04 MED ORDER — HYDROXYZINE HCL 25 MG PO TABS
25.0000 mg | ORAL_TABLET | Freq: Once | ORAL | Status: AC
  Administered 2021-04-04: 25 mg via ORAL
  Filled 2021-04-04: qty 1

## 2021-04-04 NOTE — Discharge Instructions (Addendum)
You were seen here today for evaluation of diabetic wound and itching rash.  You been prescribed hydroxyzine for itching as needed.  Additionally, you have been prescribed Keflex as your antibiotic.  Please take antibiotic as prescribed and finish the entirety of the course.  Please follow-up with your PCP within the week for reevaluation of your wound.  I would avoid using any adhesive tapes due to your reaction.    Additionally, two pulmonary nodules were seen on your CT scan with a recommended non-contrast chest CT in 3-6 months.   In regards to your palpitations, you have a benign EKG here.  Please follow-up with your PCP or cardiologist for further evaluation of this problem.  If you have any new, worsening symptoms, or any concern, please return to the nearest emergency department.

## 2021-04-04 NOTE — ED Triage Notes (Addendum)
Pt c/o wound to right side of abdomen that started about 10 days. Pt reports it started out as a blister after he fell. Wound bed is yellow. Pt also c/o "fluttering" feeling in his chest that started in the last couple of days.

## 2021-04-04 NOTE — ED Provider Notes (Signed)
[facility] EMERGENCY DEPARTMENT Provider Note   CSN: [id number] Arrival date & time: 04/04/21  1011     History Chief Complaint  Patient presents with   Wound Check    Dalton Porter is a 65 y.o. male with history of CHF, type 2 diabetes, ICD implant, stroke presents to the emergency department for evaluation of abdominal wound with surrounding rash for the past 2 weeks.  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  The next day, he reports that he had a blister on his abdomen.  He does not remember there being anything warm or burning it. He denies any worsening, but denies any improvement to the wound stating its been constant. Denies any discharge from the wound. The wife mentions that she has been covering the wound with a bandage,e ven thought the patient is allergic to adhesives. After two days, the patient developed an itching rash where the bandage was placed. The rash persisted and continued to spread with daily bandage application to the wound. He denies any throat swelling, SOB, or trouble swallowing. Additionally, the patient mentions for the past two day he has been experiencing heart palpitations occasionally. Denies any chest pain, SOB, or lightheadedness.    Wound Check Pertinent negatives include no chest pain, no abdominal pain and no shortness of breath.      Past Medical History:  Diagnosis Date   Back pain, chronic    CHF (congestive heart failure) (HCC)    Diabetes mellitus    Nonischemic cardiomyopathy (HCC)    a. EF 40% by echo in 2003 b. 03/2016: cath showing normal cors and EF of 25-35%, EF 40-45% by echo with diffuse hypokinesis.   Stroke [facility])     Patient Active Problem List   Diagnosis Date Noted   Left-sided weakness 05/28/2020   History of stroke 01/19/2019   Nonischemic cardiomyopathy (HCC) 04/09/2016   Diabetes mellitus type 2 with neurological manifestations (HCC) 01/15/2011   Chest pain on exertion  01/15/2011   HTN (hypertension) 01/15/2011    Past Surgical History:  Procedure Laterality Date   ANKLE SURGERY     APPENDECTOMY     BACK SURGERY     x3   CARDIAC CATHETERIZATION  2002   CARDIAC CATHETERIZATION N/A 04/08/2016   Procedure: Left Heart Cath and Coronary Angiography;  Surgeon: Peter M Swaziland, MD;  Location: [facility] INVASIVE CV LAB;  Service: Cardiovascular;  Laterality: N/A;   CHOLECYSTECTOMY     ELBOW SURGERY     ICD IMPLANT  01/26/2018   SJM Fortify Assura VR ICD implanted by Dr Marily Memos for primary prevention of sudden death   THUMB FUSION         Family History  Problem Relation Age of Onset   Stroke Other    Pancreatic cancer Father    Healthy Sister    Other Brother        died in a fire   Leukemia Maternal Grandmother    Healthy Sister     Social History   Tobacco Use   Smoking status: Never   Smokeless tobacco: Never  Vaping Use   Vaping Use: Never used  Substance Use Topics   Alcohol use: No   Drug use: No    Home Medications Prior to Admission medications   Medication Sig Start Date End Date Taking? Authorizing Provider  cephALEXin (KEFLEX) 500 MG capsule Take 1 capsule (500 mg total) by mouth 4 (four) times daily.  04/04/21  Yes Achille Rich, PA-C  hydrOXYzine (ATARAX/VISTARIL) 25 MG tablet Take 1 tablet (25 mg total) by mouth every 6 (six) hours as needed. 04/04/21  Yes Achille Rich, PA-C  aspirin 325 MG tablet Take 325 mg by mouth daily.    [provider]  atorvastatin (LIPITOR) 20 MG tablet Take 20 mg by mouth daily.    [provider]  Cholecalciferol (VITAMIN D3) 5000 units CAPS Take 1 capsule by mouth daily.    [provider]  furosemide (LASIX) 20 MG tablet Take 2 tablets (40 mg total) by mouth daily. 06/21/19   Strader, Lennart Pall, PA-C  gabapentin (NEURONTIN) 300 MG capsule Take 300 mg by mouth 3 (three) times daily.    [provider]  glipiZIDE (GLUCOTROL) 5 MG tablet Take 5 mg by mouth 2 (two)  times daily before a meal.    [provider]  metFORMIN (GLUCOPHAGE) 500 MG tablet Take 500 mg by mouth 2 (two) times daily with a meal.    [provider]  metoprolol succinate (TOPROL XL) 25 MG 24 hr tablet Take 1 tablet (25 mg total) by mouth 2 (two) times daily. 04/13/19   Strader, Lennart Pall, PA-C  nitroGLYCERIN (NITROSTAT) 0.4 MG SL tablet Place 1 tablet (0.4 mg total) under the tongue every 5 (five) minutes as needed for chest pain. 04/13/19   Strader, Lennart Pall, PA-C  sacubitril-valsartan (ENTRESTO) 49-51 MG Take 1 tablet by mouth 2 (two) times daily. 06/19/20   Antoine Poche, MD  spironolactone (ALDACTONE) 25 MG tablet Take 12.5 mg by mouth daily.    [provider]  TRAZODONE HCL PO Take 100 mg by mouth at bedtime.     [provider]  vitamin B-12 (CYANOCOBALAMIN) 500 MCG tablet Take 1 tablet (500 mcg total) by mouth daily. 06/01/20   Kathlen Mody, MD    Allergies    Peanuts [nuts], Meat extract, and Codeine  Review of Systems   Review of Systems  Constitutional:  Negative for chills and fever.  HENT:  Negative for ear pain and sore throat.   Eyes:  Negative for pain and visual disturbance.  Respiratory:  Negative for cough and shortness of breath.   Cardiovascular:  Positive for palpitations. Negative for chest pain.  Gastrointestinal:  Negative for abdominal pain and vomiting.  Genitourinary:  Negative for dysuria and hematuria.  Musculoskeletal:  Negative for arthralgias and back pain.  Skin:  Positive for rash and wound. Negative for color change.  Neurological:  Negative for seizures and syncope.  All other systems reviewed and are negative.  Physical Exam Updated Vital Signs BP 112/69   Pulse 78   Temp 98.1 F (36.7 C) (Oral)   Resp 18   Ht 5\' 11"  (1.803 m)   Wt 108.9 kg   SpO2 98%   BMI 33.47 kg/m   Physical Exam Vitals and nursing note reviewed.  Constitutional:      General: He is not in acute distress.     Appearance: Normal appearance. He is not toxic-appearing.  HENT:     Head: Normocephalic and atraumatic.  Eyes:     General: No scleral icterus. Cardiovascular:     Rate and Rhythm: Normal rate and regular rhythm.  Pulmonary:     Effort: Pulmonary effort is normal.     Breath sounds: Normal breath sounds.  Abdominal:     General: Bowel sounds are normal.     Palpations: Abdomen is soft.     Tenderness: There  is no abdominal tenderness. There is no guarding or rebound.  Musculoskeletal:        General: No deformity.     Cervical back: Normal range of motion.     Right lower leg: No edema.     Left lower leg: No edema.  Skin:    General: Skin is warm and dry.     Findings: Rash present.     Comments: Dry wound bed with surround erythematous papular rash to right side of abdomen. No surrounding induration or fluctuance. See picture included in chart.   Neurological:     Mental Status: He is alert and oriented to person, place, and time. Mental status is at baseline.       ED Results / Procedures / Treatments   Labs (all labs ordered are listed, but only abnormal results are displayed) Labs Reviewed - No data to display  EKG EKG Interpretation  Date/Time:  Thursday April 04 2021 11:21:44 EDT Ventricular Rate:  75 PR Interval:  202 QRS Duration: 92 QT Interval:  378 QTC Calculation: 422 R Axis:   6 Text Interpretation: Normal sinus rhythm Low voltage QRS Nonspecific T wave abnormality Abnormal ECG Confirmed by Gerhard Munch [phone]) on 04/04/2021 2:41:28 PM  Radiology CT ABDOMEN PELVIS WO CONTRAST  Result Date: 04/04/2021 CLINICAL DATA:  Open sore on the right lower quadrant of the abdomen status post fall 10 days ago. EXAM: CT ABDOMEN AND PELVIS WITHOUT CONTRAST TECHNIQUE: Multidetector CT imaging of the abdomen and pelvis was performed following the standard protocol without IV contrast. COMPARISON:  None. FINDINGS: Lower chest: A 1.0 x 0.9 x 0.8 cm solid nodule is  noted in the left lower lobe (series 4, image 17). A 0.4 x 0.3 x 0.3 cm solid nodule is noted in the right middle lobe (series 4, image 9). Partially visualized lead within the right ventricle. Bilateral gynecomastia. Hepatobiliary: No focal liver abnormality is seen. Status post cholecystectomy. No biliary dilatation. Pancreas: Diffuse mild fatty infiltration and parenchymal volume loss. No main pancreatic duct is not dilated. No peripancreatic fat stranding or fluid collection. Spleen: Normal in size without focal abnormality. Adrenals/Urinary Tract: Adrenal glands are unremarkable. Kidneys are normal, without renal calculi, focal lesion, or hydronephrosis. Bladder is unremarkable. Stomach/Bowel: Stomach is within normal limits. Appendix is surgically absent. No evidence of bowel wall thickening, distention, or inflammatory changes. Vascular/Lymphatic: Mild scattered calcific atherosclerosis of the abdominal aorta and common iliac arteries. No abdominal aortic aneurysm. No lymphadenopathy in the abdomen or pelvis. Reproductive: Prostate is unremarkable. Other: Small bilateral fat containing inguinal hernias. Mild skin thickening and subcutaneous fat stranding is noted in the anterior right lower abdominal wall, presumably at site of reported open wound. No subcutaneous emphysema or underlying focal fluid collection. Musculoskeletal: Flowing anterior osteophytes in the visualized distal thoracic spine, consistent with diffuse idiopathic skeletal hyperostosis. No suspicious osseous lesion. IMPRESSION: 1. Mild skin thickening and subcutaneous edema in the anterior right lower abdominal wall, presumably at site of reported open wound. No associated subcutaneous emphysema or focal fluid collection. 2. No acute abnormality in the abdomen or pelvis. 3. Two incidentally noted solid pulmonary nodules measuring up to 1 cm in the left lower lobe. Non-contrast chest CT at 3-6 months is recommended. If the nodules are stable  at time of repeat CT, then future CT at 18-24 months (from today's scan) is considered optional for low-risk patients, but is recommended for high-risk patients. This recommendation follows the consensus statement: Guidelines for Management of Incidental Pulmonary Nodules  Detected on CT Images: From the Fleischner Society 2017; Radiology 2017; [phone]. Electronically Signed   By: Sherron Ales M.D.   On: 04/04/2021 16:17    Procedures Procedures   Medications Ordered in ED Medications  hydrOXYzine (ATARAX/VISTARIL) tablet 25 mg (25 mg Oral Given 04/04/21 1431)    ED Course  I have reviewed the triage vital signs and the nursing notes.  Pertinent labs & imaging results that were available during my care of the patient were reviewed by me and considered in my medical decision making (see chart for details).  Dalton Porter 65 y/o M presenting to the ED for 2 weeks of abdominal wound and itching rash as well as 2 days of heart palpitations intermittently.  Differential diagnosis includes but is not limited to diabetic infection, diabetic wound, drug reaction, abdominal wall fistula, abdominal infection.  I personally reviewed the patient's imaging.  EKG shows normal sinus rhythm. CT A/P shows mild skin thickening and subcutaneous edema in the anterior right lower abdominal wall, presumably at site of reported open wound. No associated subcutaneous emphysema or focal fluid collection. No acute abnormality in the abdomen or pelvis.  Incidental pulmonary nodules visualized, recommended follow-up with PCP for future surveillance imaging.   While in the emergency department, I ordered hydroxyzine for patient's itching.  On reevaluation, the patient reports that his itching has decreased and is now tolerable.  I discussed the imaging findings with the patient.  I recommended that he stop using any bandages with adhesive as this is what is causing the pruritic rash.  I discussed with him the dangers of  wounds while having diabetes.  I prescribed him Keflex to take to aid in healing and prevent infection of his abdominal wound and hydroxyzine for the itching.  I suggested he follow-up with his PCP within the week for reevaluation of his wound.  Recommend he follow-up with his PCP for further surveillance imaging of his pulmonary nodules.  Recommended he follow-up with either his PCP or cardiologist for palpitations.  Return precautions given.  Patient agrees to plan.  Patient is stable and being discharged home in good condition.  MDM Rules/Calculators/A&P                          Final Clinical Impression(s) / ED Diagnoses Final diagnoses:  Wound infection  Rash    Rx / DC Orders ED Discharge Orders          Ordered    cephALEXin (KEFLEX) 500 MG capsule  4 times daily        04/04/21 1625    hydrOXYzine (ATARAX/VISTARIL) 25 MG tablet  Every 6 hours PRN        04/04/21 1625             Achille Rich, PA-C 04/04/21 1821    Gerhard Munch, MD 04/05/21 1515

## 2021-04-08 NOTE — Progress Notes (Signed)
Virtual Visit via Video Note The purpose of this virtual visit is to provide medical care while limiting exposure to the novel coronavirus.    Consent was obtained for video visit:  Yes.   Answered questions that patient had about telehealth interaction:  Yes.   I discussed the limitations, risks, security and privacy concerns of performing an evaluation and management service by telemedicine. I also discussed with the patient that there may be a patient responsible charge related to this service. The patient expressed understanding and agreed to proceed.  Pt location: Home Physician Location: office Name of referring provider:  [facility] I connected with Belva Chimes at patients initiation/request on 04/09/2021 at 10:30 AM EDT by video enabled telemedicine application and verified that I am speaking with the correct person using two identifiers. Pt MRN:  [id number] Pt DOB:  24-Nov-1955 Video Participants:  Belva Chimes  Assessment and Plan:    Residual left hemiparesis - possibly secondary to MRI-negative right hemispheric stroke.  Given associated headache, consider migraine but no prior history of migraines and still endorses mild but residual weakness. It seems that neurology in the hospital considered functional etiology as well.  Diabetic polyneuropathy Type 2 diabetes mellitus Hypertension Hyperlipidemia Nonischemic cardiomyopathy Tremor  1  Secondary stroke prevention as managed by PCP/cardiology: - ASA 325mg  daily - Statin.  LDL goal less than 70 - Glycemic control.  Hgb A1c goal less than 7 - Normotensive blood pressure 2  Mediterranean diet 3  Routine exercise 4  Follow up in office in 6 months.  History of Present Illness:  Dalton Porter is a 65 year old right-handed male with nonischemic cardiomyopathy, CHF, type 2 diabetes mellitus and chronic back pain who follows up for cerebellar stroke.  UPDATE: Current medications:  ASA 325mg ,  atorvastatin 20mg , furosemide 40mg , metformin, Entresto, gabapentin 300mg  TID, B12  Weakness is not better but not worse.  Still notes tremor which is stable.   HISTORY: In October 2021.  He describes severe stabbing pain lasting 1-2 minutes followed by persistent dull non-throbbing headache in between a dull persistent non-throbbing headache.  He had a similar headache in June 2021 in which he went to the ED at [facility] where he workup included sed rate 45 and CT head showing small chronic infarct in the right occipital lobe but no acute abnormality.  Otherwise, reports only history of occasional tension or "sinus" headaches.  He presented to [facility] on 05/28/2020 after endorsing left-sided upper and lower extremity weakness and difficulty with getting words out for a week.  CTA of head and neck personally reviewed showed mild atherosclerotic disease involving the left carotid bifurcation, left V4 vertebral artery and bilateral cavernous carotid arteries but no emergent large vessel occlusion or hemodynamically significant stenosis.  He was transferred to [facility] for AICD to be turned off for MRI.  MRI of brain personally reviewed showed mild chronic small vessel ischemic changes but no acute findings such as stroke.  MRI of cervical spine personally reviewed showed mild degenerative changes with mild foraminal narrowing but overall unremarkable.  He was evaluated by neurology who noted giveaway weakness on the left side.  Echocardiogram showed EF 40-45% with global hypokinesis and indeterminate left ventricular diastolic parameters.  Hgb A1c was 7.3 and LDL 17.  To further evaluate headache, he had an MRV of the head with and without contrast personally reviewed which was negative for dural sinus thrombosis.  Sed rate and CRP  were mildly elevated at 67 and 3 respectively but not fitting picture of giant cell arteritis.  He endorses numbness in the feet.  Neuropathy labs, unremarkable,  demonstrated negative ANA, SPEP/IFE negative for monoclonal gammopathy, B12 398, methylmalonic acid level 210, negative SSA/SSB antibodies.  CK was 85.  HIV negative.  He was discharged with increased ASA from 81mg  to 325mg  daily.  He underwent outpatient PT/OT/speech therapy which was helpful.  He still endorses some left sided weakness for which he uses a cane.  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   Past Medical History: Past Medical History:  Diagnosis Date   Back pain, chronic    CHF (congestive heart failure) (HCC)    Diabetes mellitus    Nonischemic cardiomyopathy (HCC)    a. EF 40% by echo in 2003 b. 03/2016: cath showing normal cors and EF of 25-35%, EF 40-45% by echo with diffuse hypokinesis.   Stroke [facility]     Medications: Outpatient Encounter Medications as of 04/09/2021  Medication Sig Note   aspirin 325 MG tablet Take 325 mg by mouth daily.    atorvastatin (LIPITOR) 20 MG tablet Take 20 mg by mouth daily.    cephALEXin (KEFLEX) 500 MG capsule Take 1 capsule (500 mg total) by mouth 4 (four) times daily.    Cholecalciferol (VITAMIN D3) 5000 units CAPS Take 1 capsule by mouth daily.    furosemide (LASIX) 20 MG tablet Take 2 tablets (40 mg total) by mouth daily.    gabapentin (NEURONTIN) 300 MG capsule Take 300 mg by mouth 3 (three) times daily.    glipiZIDE (GLUCOTROL) 5 MG tablet Take 5 mg by mouth 2 (two) times daily before a meal.    hydrOXYzine (ATARAX/VISTARIL) 25 MG tablet Take 1 tablet (25 mg total) by mouth every 6 (six) hours as needed.    metFORMIN (GLUCOPHAGE) 500 MG tablet Take 500 mg by mouth 2 (two) times daily with a meal.    metoprolol succinate (TOPROL XL) 25 MG 24 hr tablet Take 1 tablet (25 mg total) by mouth 2 (two) times daily.    nitroGLYCERIN (NITROSTAT) 0.4 MG SL tablet Place 1 tablet (0.4 mg total) under the tongue every 5 (five) minutes as needed for chest pain. 05/28/2020: Pt has prn    sacubitril-valsartan (ENTRESTO) 49-51 MG Take 1 tablet by mouth 2 (two) times daily.    spironolactone (ALDACTONE) 25 MG tablet Take 12.5 mg by mouth daily.    TRAZODONE HCL PO Take 100 mg by mouth at bedtime.     vitamin B-12 (CYANOCOBALAMIN) 500 MCG tablet Take 1 tablet (500 mcg total) by mouth daily.    No facility-administered encounter medications on file as of 04/09/2021.    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 No acute distress.  Alert and oriented.  Speech fluent and not dysarthric.  Language  intact.    Follow Up Instructions:    -I discussed the assessment and treatment plan with the patient. The patient was provided an opportunity to ask questions and all were answered. The patient agreed with the plan and demonstrated an understanding of the instructions.   The patient was advised to call back or seek an in-person evaluation if the symptoms worsen or if the condition fails to improve as anticipated.   Cira Servant, DO

## 2021-04-09 ENCOUNTER — Other Ambulatory Visit: Payer: Self-pay

## 2021-04-09 ENCOUNTER — Encounter: Payer: Self-pay | Admitting: Neurology

## 2021-04-09 ENCOUNTER — Telehealth (INDEPENDENT_AMBULATORY_CARE_PROVIDER_SITE_OTHER): Payer: No Typology Code available for payment source | Admitting: Neurology

## 2021-04-09 DIAGNOSIS — E1142 Type 2 diabetes mellitus with diabetic polyneuropathy: Secondary | ICD-10-CM

## 2021-04-09 DIAGNOSIS — I10 Essential (primary) hypertension: Secondary | ICD-10-CM | POA: Diagnosis not present

## 2021-04-09 DIAGNOSIS — I69359 Hemiplegia and hemiparesis following cerebral infarction affecting unspecified side: Secondary | ICD-10-CM | POA: Diagnosis not present

## 2021-04-09 DIAGNOSIS — E785 Hyperlipidemia, unspecified: Secondary | ICD-10-CM

## 2021-04-09 DIAGNOSIS — R251 Tremor, unspecified: Secondary | ICD-10-CM

## 2021-04-09 DIAGNOSIS — I428 Other cardiomyopathies: Secondary | ICD-10-CM | POA: Diagnosis not present

## 2021-04-12 ENCOUNTER — Telehealth: Payer: Self-pay | Admitting: Cardiology

## 2021-04-12 MED ORDER — ENTRESTO 49-51 MG PO TABS: 1.0000 | ORAL_TABLET | Freq: Two times a day (BID) | ORAL | 11 refills | Status: DC

## 2021-04-12 NOTE — Telephone Encounter (Signed)
Refill complete 

## 2021-04-12 NOTE — Telephone Encounter (Signed)
*  STAT* If patient is at the pharmacy, call can be transferred to refill team.   1. Which medications need to be refilled? (please list name of each medication and dose if known) sacubitril-valsartan (ENTRESTO) 49-51 MG  2. Which pharmacy/location (including street and city if local pharmacy) is medication to be sent to? [facility] in [place]   3. Do they need a 30 day or 90 day supply? 90

## 2021-04-15 ENCOUNTER — Ambulatory Visit (INDEPENDENT_AMBULATORY_CARE_PROVIDER_SITE_OTHER): Payer: Medicare Other

## 2021-04-15 DIAGNOSIS — I5022 Chronic systolic (congestive) heart failure: Secondary | ICD-10-CM

## 2021-04-15 DIAGNOSIS — Z95810 Presence of automatic (implantable) cardiac defibrillator: Secondary | ICD-10-CM

## 2021-04-17 ENCOUNTER — Encounter: Payer: Self-pay | Admitting: *Deleted

## 2021-04-17 NOTE — Progress Notes (Signed)
EPIC Encounter for ICM Monitoring  Patient Name: Dalton Porter is a 65 y.o. male Date: 04/17/2021 Primary Care Physican: Wallace Cullens, MD Primary Cardiologist: Branch Electrophysiologist: Allred 02/27/2021 Office Weight: 241 lbs     Transmission reviewed.    [facility] Thoracic impedance normal.   Prescribed: Furosemide 20 mg take 2 tablets (40 mg total) daily. Spironolactone 25 mg take 0.5 tablet (12.5 mg total) daily   Labs: 05/30/2020 Creatinine 1.30, BUN 21, Potassium 4.5, Sodium 136, GFR >60 05/28/2020 Creatinine 1.40, BUN 21, Potassium 4.8, Sodium 139, GFR 59  A complete set of results can be found in Results Review.   Recommendations: No changes.    Follow-up plan: ICM clinic phone appointment on 05/27/2021.  91 day device clinic remote transmission 06/10/2021.     EP/Cardiology Office Visits: 08/29/2021 with Dr Wyline Mood.   06/07/2021 with Dr Johney Frame.    Copy of ICM check sent to Dr. Johney Frame.     3 month ICM trend: 04/15/2021.    1 Year ICM trend:       Karie Soda, RN 04/17/2021 2:59 PM

## 2021-05-27 ENCOUNTER — Ambulatory Visit (INDEPENDENT_AMBULATORY_CARE_PROVIDER_SITE_OTHER): Payer: Medicare Other

## 2021-05-27 DIAGNOSIS — Z95810 Presence of automatic (implantable) cardiac defibrillator: Secondary | ICD-10-CM

## 2021-05-27 DIAGNOSIS — I5022 Chronic systolic (congestive) heart failure: Secondary | ICD-10-CM

## 2021-05-31 ENCOUNTER — Telehealth: Payer: Self-pay

## 2021-05-31 ENCOUNTER — Encounter: Payer: Medicare Other | Admitting: Internal Medicine

## 2021-05-31 NOTE — Telephone Encounter (Signed)
Remote ICM transmission received.  Attempted call to patient regarding ICM remote transmission and left detailed message per DPR.  Advised to return call for any fluid symptoms or questions. Next ICM remote transmission scheduled 07/08/2021.    

## 2021-05-31 NOTE — Progress Notes (Signed)
EPIC Encounter for ICM Monitoring  Patient Name: Dalton Porter is a 65 y.o. male Date: 05/31/2021 Primary Care Physican: Wallace Cullens, MD Primary Cardiologist: Branch Electrophysiologist: Allred 02/27/2021 Office Weight: 241 lbs     Attempted call to patient and unable to reach.  Left detailed message per DPR regarding transmission. Transmission reviewed.    Corvue Thoracic impedance normal but was suggesting possible fluid accumulation from 11/15-11/20.   Prescribed: Furosemide 20 mg take 2 tablets (40 mg total) daily. Spironolactone 25 mg take 0.5 tablet (12.5 mg total) daily   Labs: 05/30/2020 Creatinine 1.30, BUN 21, Potassium 4.5, Sodium 136, GFR >60 05/28/2020 Creatinine 1.40, BUN 21, Potassium 4.8, Sodium 139, GFR 59  A complete set of results can be found in Results Review.   Recommendations: Left voice mail with ICM number and encouraged to call if experiencing any fluid symptoms.    Follow-up plan: ICM clinic phone appointment on 07/08/2021.  91 day device clinic remote transmission 06/10/2021.     EP/Cardiology Office Visits: 08/29/2021 with Dr Wyline Mood.   06/07/2021 with Dr Johney Frame.    Copy of ICM check sent to Dr. Johney Frame.     3 month ICM trend: 05/27/2021.    12-14 Month ICM trend:       Karie Soda, RN 05/31/2021 3:05 PM

## 2021-06-07 ENCOUNTER — Encounter: Payer: No Typology Code available for payment source | Admitting: Internal Medicine

## 2021-06-10 ENCOUNTER — Ambulatory Visit (INDEPENDENT_AMBULATORY_CARE_PROVIDER_SITE_OTHER): Payer: Medicare Other

## 2021-06-10 DIAGNOSIS — I428 Other cardiomyopathies: Secondary | ICD-10-CM

## 2021-06-11 LAB — CUP PACEART REMOTE DEVICE CHECK
Battery Remaining Longevity: 85 mo
Battery Remaining Percentage: 74 %
Battery Voltage: 2.99 V
Brady Statistic RV Percent Paced: 8 %
Date Time Interrogation Session: 20221212162954
HighPow Impedance: 78 Ohm
HighPow Impedance: 78 Ohm
Implantable Lead Implant Date: 20190730
Implantable Lead Location: 753860
Implantable Pulse Generator Implant Date: 20190730
Lead Channel Impedance Value: 350 Ohm
Lead Channel Pacing Threshold Amplitude: 0.75 V
Lead Channel Pacing Threshold Pulse Width: 0.5 ms
Lead Channel Sensing Intrinsic Amplitude: 11.5 mV
Lead Channel Setting Pacing Amplitude: 2.5 V
Lead Channel Setting Pacing Pulse Width: 0.5 ms
Lead Channel Setting Sensing Sensitivity: 0.5 mV
Pulse Gen Serial Number: 9839597

## 2021-06-14 ENCOUNTER — Other Ambulatory Visit: Payer: Self-pay

## 2021-06-14 ENCOUNTER — Ambulatory Visit (INDEPENDENT_AMBULATORY_CARE_PROVIDER_SITE_OTHER): Payer: Medicare Other | Admitting: Internal Medicine

## 2021-06-14 ENCOUNTER — Encounter (HOSPITAL_BASED_OUTPATIENT_CLINIC_OR_DEPARTMENT_OTHER): Payer: Self-pay | Admitting: Internal Medicine

## 2021-06-14 VITALS — BP 116/62 | HR 74 | Ht 71.0 in | Wt 240.6 lb

## 2021-06-14 DIAGNOSIS — I428 Other cardiomyopathies: Secondary | ICD-10-CM | POA: Diagnosis not present

## 2021-06-14 DIAGNOSIS — I10 Essential (primary) hypertension: Secondary | ICD-10-CM

## 2021-06-14 LAB — CUP PACEART INCLINIC DEVICE CHECK
Battery Remaining Longevity: 87 mo
Brady Statistic RV Percent Paced: 8 %
Date Time Interrogation Session: 20221216113916
HighPow Impedance: 77.625
Implantable Lead Implant Date: 20190730
Implantable Lead Location: 753860
Implantable Pulse Generator Implant Date: 20190730
Lead Channel Impedance Value: 362.5 Ohm
Lead Channel Pacing Threshold Amplitude: 1 V
Lead Channel Pacing Threshold Pulse Width: 0.5 ms
Lead Channel Sensing Intrinsic Amplitude: 11.5 mV
Lead Channel Setting Pacing Amplitude: 2.5 V
Lead Channel Setting Pacing Pulse Width: 0.5 ms
Lead Channel Setting Sensing Sensitivity: 0.5 mV
Pulse Gen Serial Number: 9839597

## 2021-06-14 NOTE — Patient Instructions (Addendum)
Medication Instructions:  Your physician recommends that you continue on your current medications as directed. Please refer to the Current Medication list given to you today. *If you need a refill on your cardiac medications before your next appointment, please call your pharmacy*  Lab Work: None. If you have labs (blood work) drawn today and your tests are completely normal, you will receive your results only by: MyChart Message (if you have MyChart) OR A paper copy in the mail If you have any lab test that is abnormal or we need to change your treatment, we will call you to review the results.  Testing/Procedures: None.  Follow-Up: At [facility], you and your health needs are our priority.  As part of our continuing mission to provide you with exceptional heart care, we have created designated Provider Care Teams.  These Care Teams include your primary Cardiologist (physician) and Advanced Practice Providers (APPs -  Physician Assistants and Nurse Practitioners) who all work together to provide you with the care you need, when you need it.  Your physician wants you to follow-up in: 12 months with  Hillis Range, MD in [place].    You will receive a reminder letter in the mail two months in advance. If you don't receive a letter, please call our office to schedule the follow-up appointment.  Remote monitoring is used to monitor your ICD from home. This monitoring reduces the number of office visits required to check your device to one time per year. It allows [place] to keep an eye on the functioning of your device to ensure it is working properly. You are scheduled for a device check from home on 09/09/21. You may send your transmission at any time that day. If you have a wireless device, the transmission will be sent automatically. After your physician reviews your transmission, you will receive a postcard with your next transmission date.  We recommend signing up for the patient portal called  "MyChart".  Sign up information is provided on this After Visit Summary.  MyChart is used to connect with patients for Virtual Visits (Telemedicine).  Patients are able to view lab/test results, encounter notes, upcoming appointments, etc.  Non-urgent messages can be sent to your provider as well.   To learn more about what you can do with MyChart, go to [email or web address].    Any Other Special Instructions Will Be Listed Below (If Applicable).

## 2021-06-14 NOTE — Progress Notes (Signed)
PCP: Wallace Cullens, MD Primary Cardiologist: Dr Wyline Mood Primary EP: Dr Maxine Glenn III is a 65 y.o. male who presents today for routine electrophysiology followup.  Since last being seen in our clinic, the patient reports doing very well.  Today, he denies symptoms of palpitations, chest pain, shortness of breath,  lower extremity edema, dizziness, presyncope, syncope, or ICD shocks.  The patient is otherwise without complaint today.   Past Medical History:  Diagnosis Date   Back pain, chronic    CHF (congestive heart failure) (HCC)    Diabetes mellitus    Nonischemic cardiomyopathy (HCC)    a. EF 40% by echo in 2003 b. 03/2016: cath showing normal cors and EF of 25-35%, EF 40-45% by echo with diffuse hypokinesis.   Stroke [facility])    Past Surgical History:  Procedure Laterality Date   ANKLE SURGERY     APPENDECTOMY     BACK SURGERY     x3   CARDIAC CATHETERIZATION  2002   CARDIAC CATHETERIZATION N/A 04/08/2016   Procedure: Left Heart Cath and Coronary Angiography;  Surgeon: Peter M Swaziland, MD;  Location: [facility] INVASIVE CV LAB;  Service: Cardiovascular;  Laterality: N/A;   CHOLECYSTECTOMY     ELBOW SURGERY     ICD IMPLANT  01/26/2018   SJM Fortify Assura VR ICD implanted by Dr Marily Memos for primary prevention of sudden death   THUMB FUSION      ROS- all systems are reviewed and negative except as per HPI above  Current Outpatient Medications  Medication Sig Dispense Refill   aspirin 325 MG tablet Take 325 mg by mouth daily.     atorvastatin (LIPITOR) 20 MG tablet Take 20 mg by mouth daily.     Cholecalciferol (VITAMIN D3) 5000 units CAPS Take 1 capsule by mouth daily.     furosemide (LASIX) 20 MG tablet Take 2 tablets (40 mg total) by mouth daily. 270 tablet 0   gabapentin (NEURONTIN) 300 MG capsule Take 300 mg by mouth 3 (three) times daily.     glipiZIDE (GLUCOTROL) 5 MG tablet Take 5 mg by mouth 2 (two) times daily before a meal.     metFORMIN (GLUCOPHAGE) 500 MG  tablet Take 500 mg by mouth 2 (two) times daily with a meal.     metoprolol succinate (TOPROL XL) 25 MG 24 hr tablet Take 1 tablet (25 mg total) by mouth 2 (two) times daily. 45 tablet 0   nitroGLYCERIN (NITROSTAT) 0.4 MG SL tablet Place 1 tablet (0.4 mg total) under the tongue every 5 (five) minutes as needed for chest pain. 25 tablet 3   sacubitril-valsartan (ENTRESTO) 49-51 MG Take 1 tablet by mouth 2 (two) times daily. 60 tablet 11   spironolactone (ALDACTONE) 25 MG tablet Take 12.5 mg by mouth daily.     TRAZODONE HCL PO Take 100 mg by mouth at bedtime.      vitamin B-12 (CYANOCOBALAMIN) 500 MCG tablet Take 1 tablet (500 mcg total) by mouth daily. 30 tablet 1   No current facility-administered medications for this visit.    Physical Exam: Vitals:   06/14/21 1137  BP: 116/62  Pulse: 74  SpO2: 95%  Weight: 240 lb 9.6 oz (109.1 kg)  Height: 5\' 11"  (1.803 m)    GEN- The patient is well appearing, alert and oriented x 3 today.   Head- normocephalic, atraumatic Eyes-  Sclera clear, conjunctiva pink Ears- hearing intact Oropharynx- clear Lungs- Clear to ausculation bilaterally, normal work of breathing  Chest- ICD pocket is well healed Heart- Regular rate and rhythm, no murmurs, rubs or gallops, PMI not laterally displaced GI- soft, NT, ND, + BS Extremities- no clubbing, cyanosis, or edema  ICD interrogation- reviewed in detail today,  See PACEART report  ekg tracing ordered today is personally reviewed and shows  sinus rhythm, PR 238 msec, QRS 92 msec  Wt Readings from Last 3 Encounters:  06/14/21 240 lb 9.6 oz (109.1 kg)  04/04/21 240 lb (108.9 kg)  02/27/21 241 lb 9.6 oz (109.6 kg)    Assessment and Plan:  1.  Chronic systolic dysfunction/ nonischemic CM euvolemic today Stable on an appropriate medical regimen Normal ICD function See [facility]ace [facility] report No changes today he is not device dependant today followed in ICM device clinic  2. HTN Stable No change required  today  3. HL Stable No change required today  Return to see me in [place] in a year  Hillis Range MD, [facility] 06/14/2021 11:52 AM

## 2021-06-20 NOTE — Progress Notes (Signed)
Remote ICD transmission.   

## 2021-07-08 ENCOUNTER — Ambulatory Visit (INDEPENDENT_AMBULATORY_CARE_PROVIDER_SITE_OTHER): Payer: Medicare Other

## 2021-07-08 DIAGNOSIS — I5022 Chronic systolic (congestive) heart failure: Secondary | ICD-10-CM | POA: Diagnosis not present

## 2021-07-08 DIAGNOSIS — Z95810 Presence of automatic (implantable) cardiac defibrillator: Secondary | ICD-10-CM | POA: Diagnosis not present

## 2021-07-12 NOTE — Progress Notes (Signed)
EPIC Encounter for ICM Monitoring  Patient Name: Dalton Porter is a 66 y.o. male Date: 07/12/2021 Primary Care Physican: Wallace Cullens, MD Primary Cardiologist: Branch Electrophysiologist: Allred 06/14/2021 Office Weight: 240 lbs     Transmission reviewed.    Corvue Thoracic impedance normal.   Prescribed: Furosemide 20 mg take 2 tablets (40 mg total) daily. Spironolactone 25 mg take 0.5 tablet (12.5 mg total) daily   Labs: 05/30/2020 Creatinine 1.30, BUN 21, Potassium 4.5, Sodium 136, GFR >60 05/28/2020 Creatinine 1.40, BUN 21, Potassium 4.8, Sodium 139, GFR 59  A complete set of results can be found in Results Review.   Recommendations:  No changes.     Follow-up plan: ICM clinic phone appointment on 08/12/2021.  91 day device clinic remote transmission 09/09/2021.     EP/Cardiology Office Visits: 08/29/2021 with Dr Wyline Mood.       Copy of ICM check sent to Dr. Johney Frame.     3 month ICM trend: 07/08/2021.    12-14 Month ICM trend:     Karie Soda, RN 07/12/2021 9:16 AM

## 2021-08-06 ENCOUNTER — Other Ambulatory Visit (HOSPITAL_COMMUNITY): Payer: Self-pay | Admitting: Pulmonary Disease

## 2021-08-06 DIAGNOSIS — R911 Solitary pulmonary nodule: Secondary | ICD-10-CM

## 2021-08-12 ENCOUNTER — Ambulatory Visit (INDEPENDENT_AMBULATORY_CARE_PROVIDER_SITE_OTHER): Payer: Medicare Other

## 2021-08-12 DIAGNOSIS — I5022 Chronic systolic (congestive) heart failure: Secondary | ICD-10-CM | POA: Diagnosis not present

## 2021-08-12 DIAGNOSIS — Z95810 Presence of automatic (implantable) cardiac defibrillator: Secondary | ICD-10-CM

## 2021-08-14 NOTE — Progress Notes (Signed)
EPIC Encounter for ICM Monitoring  Patient Name: Dalton Porter is a 66 y.o. male Date: 08/14/2021 Primary Care Physican: Wallace Cullens, MD Primary Cardiologist: Branch Electrophysiologist: Allred 06/14/2021 Office Weight: 240 lbs     Transmission reviewed.    Corvue Thoracic impedance normal but suggesting intermittent days with possible fluid accumulation alternating with days at baseline.   Prescribed: Furosemide 20 mg take 2 tablets (40 mg total) daily. Spironolactone 25 mg take 0.5 tablet (12.5 mg total) daily   Labs: 05/30/2020 Creatinine 1.30, BUN 21, Potassium 4.5, Sodium 136, GFR >60 05/28/2020 Creatinine 1.40, BUN 21, Potassium 4.8, Sodium 139, GFR 59  A complete set of results can be found in Results Review.   Recommendations:  No changes.     Follow-up plan: ICM clinic phone appointment on 09/16/2021.  91 day device clinic remote transmission 09/09/2021.     EP/Cardiology Office Visits: 08/29/2021 with Dr Wyline Mood.       Copy of ICM check sent to Dr. Johney Frame.     3 month ICM trend: 08/12/2021.    12-14 Month ICM trend:     Karie Soda, RN 08/14/2021 12:37 PM

## 2021-08-15 ENCOUNTER — Other Ambulatory Visit: Payer: Self-pay

## 2021-08-15 ENCOUNTER — Encounter (HOSPITAL_COMMUNITY)
Admission: RE | Admit: 2021-08-15 | Discharge: 2021-08-15 | Disposition: A | Discharge status: Home / Self Care | Payer: No Typology Code available for payment source | Source: Ambulatory Visit | Attending: Pulmonary Disease | Admitting: Pulmonary Disease

## 2021-08-15 DIAGNOSIS — R911 Solitary pulmonary nodule: Secondary | ICD-10-CM | POA: Insufficient documentation

## 2021-08-15 MED ORDER — FLUDEOXYGLUCOSE F - 18 (FDG) INJECTION
12.9340 | Freq: Once | INTRAVENOUS | Status: AC | PRN
  Administered 2021-08-15: 12.934 via INTRAVENOUS

## 2021-08-29 ENCOUNTER — Ambulatory Visit (INDEPENDENT_AMBULATORY_CARE_PROVIDER_SITE_OTHER): Payer: No Typology Code available for payment source | Admitting: Cardiology

## 2021-08-29 ENCOUNTER — Encounter: Payer: Self-pay | Admitting: *Deleted

## 2021-08-29 ENCOUNTER — Telehealth: Payer: Self-pay

## 2021-08-29 ENCOUNTER — Encounter: Payer: Self-pay | Admitting: Cardiology

## 2021-08-29 VITALS — BP 112/60 | HR 76 | Ht 71.0 in | Wt 243.4 lb

## 2021-08-29 DIAGNOSIS — I4729 Other ventricular tachycardia: Secondary | ICD-10-CM | POA: Diagnosis not present

## 2021-08-29 DIAGNOSIS — I5022 Chronic systolic (congestive) heart failure: Secondary | ICD-10-CM

## 2021-08-29 DIAGNOSIS — R0789 Other chest pain: Secondary | ICD-10-CM

## 2021-08-29 NOTE — Patient Instructions (Addendum)
Medication Instructions:   Your physician recommends that you continue on your current medications as directed. Please refer to the Current Medication list given to you today.  Labwork:  none  Testing/Procedures:  none  Follow-Up:  Your physician recommends that you schedule a follow-up appointment in: 4 months.   Any Other Special Instructions Will Be Listed Below (If Applicable).  If you need a refill on your cardiac medications before your next appointment, please call your pharmacy. 

## 2021-08-29 NOTE — Progress Notes (Signed)
Clinical Summary Mr. Spuhler is a 66 y.o.male seen today for follow up of the following medical problems.    1. Chest pain - admit 03/2016 with chest pain.  - cath 03/2016 with normal coronaries.  - 03/2016 echo LVEF 40-45% - cardiac MRI 04/2016 without evidence of infiltrative or inflammatory process. LVEF by MRI 33%, diffuse hypokinesis.    05/2019 coronary CTA: LAD,OM1,OM2 disease not significant by FFR.    -long history of of chest pains, prior cath and CTA as reported above -recent was seen in [place] ER for chest pains, was referred for nuclear stress test. We do not have the results at this time, his understanding was this was benign.      2. Chronic systoilc HF - echo 03/2016 LVEF 40-45%, recent cath patent coronaries - medical therapy limited by soft bp's and Wenchebach heart block - from discharge notes concern about possible infiltratrive process. cardiac MRI 04/2016 without evidence of infiltrative or inflammatory process. LVEF by MRI 33%, diffuse hypokinesis.   - AICD followed by EP   - [facility]%, grade II diastolic dysfunction - ICD placed January 26, 2018 at [facility] in  [place], followed by Dr Rayann Heman    04/2020 echo: LVEF 40-45%    - reports [facility] lowered aldactone due to increased potassium.  - no recent SOB/DOE  - no LE edema - compliant with meds. [facility] had started jardiance, caused genital infection and now off    3. OSA screen - +snoring, can some have apneic episodes. Occasional daytime somnolence.  - has not been interested in sleep study   -limited interest in sleep study.     4. History of CVA - he is full dose ASA due to prior CVA.    5. NSVT - he reports [facility] had done a device check and detected, ordered a 2 week holter which he wore - holter shows 12 episodes of VT. The report does tell how long the episodes were, only the fasted was 174 bpm and that particular episode was 11 beats.         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  Past Medical History:  Diagnosis Date   Back pain, chronic    CHF (congestive heart failure) (HCC)    Diabetes mellitus    Nonischemic cardiomyopathy ([place])    a. EF 40% by echo in 2003 b. 03/2016: cath showing normal cors and EF of 25-35%, EF 40-45% by echo with diffuse hypokinesis.   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     atorvastatin (LIPITOR) 20 MG tablet Take 20 mg by mouth daily.     Cholecalciferol (VITAMIN D3) 5000 units CAPS Take 1 capsule by mouth daily.     furosemide (LASIX) 20 MG tablet Take 2 tablets (40 mg total) by mouth daily. 270 tablet 0   gabapentin (NEURONTIN) 300 MG capsule Take 300 mg by mouth 3 (three) times daily.     glipiZIDE (GLUCOTROL) 5 MG tablet Take 5 mg by mouth 2 (two) times daily before a meal.  metFORMIN (GLUCOPHAGE) 500 MG tablet Take 500 mg by mouth 2 (two) times daily with a meal.     metoprolol succinate (TOPROL XL) 25 MG 24 hr tablet Take 1 tablet (25 mg total) by mouth 2 (two) times daily. 45 tablet 0   nitroGLYCERIN (NITROSTAT) 0.4 MG SL tablet Place 1 tablet (0.4 mg total) under the tongue every 5 (five) minutes as needed for chest pain. 25 tablet 3   sacubitril-valsartan (ENTRESTO) 49-51 MG Take 1 tablet by mouth 2 (two) times daily. 60 tablet 11   spironolactone (ALDACTONE) 25 MG tablet Take 12.5 mg by mouth daily.     TRAZODONE HCL PO Take 100 mg by mouth at bedtime.      vitamin B-12 (CYANOCOBALAMIN) 500 MCG tablet Take 1 tablet (500 mcg total) by mouth daily. 30 tablet 1   No current facility-administered medications for this visit.     Past Surgical History:  Procedure Laterality Date   ANKLE SURGERY      APPENDECTOMY     BACK SURGERY     x3   CARDIAC CATHETERIZATION  2002   CARDIAC CATHETERIZATION N/A 04/08/2016   Procedure: Left Heart Cath and Coronary Angiography;  Surgeon: Peter M Martinique, MD;  Location: [place] CV LAB;  Service: Cardiovascular;  Laterality: N/A;   CHOLECYSTECTOMY     ELBOW SURGERY     ICD IMPLANT  01/26/2018   SJM Fortify Assura VR ICD implanted by Dr [name] for primary prevention of sudden death   THUMB FUSION       Allergies  Allergen Reactions   Peanuts [Nuts] Anaphylaxis   Meat Extract     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 Nordmeyer reports that he has never smoked. He has never used smokeless tobacco. Mr. Lehouillier reports no history of alcohol use.   Review of Systems CONSTITUTIONAL: No weight loss, fever, chills, weakness or fatigue.  HEENT: Eyes: No visual loss, blurred vision, double vision or yellow sclerae.No hearing loss, sneezing, congestion, runny nose or sore throat.  SKIN: No rash or itching.  CARDIOVASCULAR: per hpi RESPIRATORY: No shortness of breath, cough or sputum.  GASTROINTESTINAL: No anorexia, nausea, vomiting or diarrhea. No abdominal pain or blood.  GENITOURINARY: No burning on urination, no polyuria NEUROLOGICAL: No headache, dizziness, syncope, paralysis, ataxia, numbness or tingling in the extremities. No change in bowel or bladder control.  MUSCULOSKELETAL: No muscle, back pain, joint pain or stiffness.  LYMPHATICS: No enlarged nodes. No history of splenectomy.  PSYCHIATRIC: No history of depression or anxiety.  ENDOCRINOLOGIC: No reports of sweating, cold or heat intolerance. No polyuria or polydipsia.   [name]   Physical Examination Today's Vitals   08/29/21 1429  BP: 112/60  Pulse: 76  SpO2: 96%  Weight: 243 lb 6.4 oz (110.4 kg)  Height: 5\' 11"  (1.803 m)   Body mass index is 33.95 kg/m.  Gen: resting comfortably, no acute distress HEENT: no scleral icterus, pupils equal round and reactive, no palptable cervical adenopathy,  CV: RRR, no m/r/g no jvd Resp: Clear to  auscultation bilaterally GI: abdomen is soft, non-tender, non-distended, normal bowel sounds, no hepatosplenomegaly MSK: extremities are warm, no edema.  Skin: warm, no rash Neuro:  no focal deficits Psych: appropriate affect   Diagnostic Studies  03/2016 cath There is severe left ventricular systolic dysfunction. LV end diastolic pressure is mildly elevated. The left ventricular ejection fraction is 25-35% by visual estimate.   1. Normal coronary anatomy 2. Severe LV dysfunction- global 3. Mildly elevated LVEDP   Plan: optimize medical therapy for CHF.   03/2016 echo Study Conclusions   - Left ventricle: The cavity size was normal. There was mild focal   basal hypertrophy of the septum. Systolic function was mildly to   moderately reduced. The estimated ejection fraction was in the   range of 40% to 45%. Diffuse hypokinesis. Doppler parameters are   consistent with abnormal left ventricular relaxation (grade 1   diastolic dysfunction). - Mitral valve: There was mild regurgitation. - Left atrium: The atrium was mildly dilated. - Pulmonary arteries: Systolic pressure was mildly increased.     04/2016 Cardiac MRI IMPRESSION: 1. Moderately dilated left ventricle with normal wall thickness and moderately decreased systolic function (LVEF = 33%) with diffuse hypokinesis.   There is no late gadolinium enhancement.   There is no evidence for infiltrative or inflammatory myocardial disease. 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   2. Normal right  ventricular size, thickness and systolic function (RVEF = 50%) with no wall motion abnormalities.   3.  Moderately dilated left atrium (45 mm).   4. Normal size of the aortic root, ascending aorta and pulmonary artery.   5.  Mild mitral and tricuspid regurgitation.   03/2016 Event monitor Telemetry tracings show primarily sinus rhythm with occasional first degree and second degree Mobitz I Desiree Hane) block, occasional PVCs Reported symptoms correlate with sinus rhtyhm with occasional PVCs     11/2017 [facility]atch Wenchebach block noted. NSVT up to 19 beats.    [facility] LVEF 30%     Assessment and Plan  1. Chronic systolic HF -euvolemic without symptoms - on optimal medical therapy, genital infection on jardiance now off.    2. Chest pain - fairly chronic symptoms, CTA last year without significant disease -he reports more recent nuclear stress at [place] we are awaiting results   3.NSVT - recent holter at [facility], pretty limited report I cannot tell how long episodes were. Will see if we can get actual monitor printout itself - will have his ICD checked remotely - denies significant symptoms.  - request labs from pcp      Arnoldo Lenis, M.D.

## 2021-08-29 NOTE — Telephone Encounter (Signed)
-----   Message from Eustace Moore, RN sent at 08/29/2021  3:12 PM EST ----- ?Regarding: transmission check ?Dr. Wyline Mood saw this patient in the office today and is requesting to have his device checked due to having palpitations and NSVT seen on recent holter monitor.  ? ?

## 2021-08-29 NOTE — Telephone Encounter (Signed)
Attempted to contact patient to send manual transmission as requested by Dr. Wyline Mood. No answer, LMTCB. ?

## 2021-08-30 NOTE — Telephone Encounter (Signed)
2nd attempt to contact patient to send manual transmission.  ?

## 2021-09-06 LAB — CUP PACEART REMOTE DEVICE CHECK
Battery Remaining Longevity: 83 mo
Battery Remaining Percentage: 73 %
Battery Voltage: 2.99 V
Brady Statistic RV Percent Paced: 12 %
Date Time Interrogation Session: 20230310120621
HighPow Impedance: 69 Ohm
HighPow Impedance: 69 Ohm
Implantable Lead Implant Date: 20190730
Implantable Lead Location: 753860
Implantable Pulse Generator Implant Date: 20190730
Lead Channel Impedance Value: 330 Ohm
Lead Channel Pacing Threshold Amplitude: 1 V
Lead Channel Pacing Threshold Pulse Width: 0.5 ms
Lead Channel Sensing Intrinsic Amplitude: 11.5 mV
Lead Channel Setting Pacing Amplitude: 2.5 V
Lead Channel Setting Pacing Pulse Width: 0.5 ms
Lead Channel Setting Sensing Sensitivity: 0.5 mV
Pulse Gen Serial Number: 9839597

## 2021-09-06 NOTE — Telephone Encounter (Signed)
The patient gave me a call to get help sending a manual transmission. Transmission received and in Paceart 09-06-2021. I verified his number and let him know that the nurse will review the transmission and give him a call back. ?

## 2021-09-06 NOTE — Telephone Encounter (Signed)
Have attempted to contact patient x 3 closing encounter ?

## 2021-09-09 ENCOUNTER — Ambulatory Visit (INDEPENDENT_AMBULATORY_CARE_PROVIDER_SITE_OTHER): Payer: Medicare Other

## 2021-09-09 DIAGNOSIS — I428 Other cardiomyopathies: Secondary | ICD-10-CM | POA: Diagnosis not present

## 2021-09-16 ENCOUNTER — Ambulatory Visit (INDEPENDENT_AMBULATORY_CARE_PROVIDER_SITE_OTHER): Payer: No Typology Code available for payment source

## 2021-09-16 DIAGNOSIS — Z95810 Presence of automatic (implantable) cardiac defibrillator: Secondary | ICD-10-CM

## 2021-09-16 DIAGNOSIS — I5022 Chronic systolic (congestive) heart failure: Secondary | ICD-10-CM

## 2021-09-19 ENCOUNTER — Telehealth: Payer: Self-pay

## 2021-09-19 NOTE — Telephone Encounter (Signed)
LMOVM for patient to send missed ICM transmission. 

## 2021-09-20 NOTE — Progress Notes (Signed)
EPIC Encounter for ICM Monitoring ? ?Patient Name: Dalton Porter is a 66 y.o. male ?Date: 09/20/2021 ?Primary Care Physican: Wallace Cullens, MD ?Primary Cardiologist: Wyline Mood ?Electrophysiologist: Allred ?06/14/2021 Office Weight: 240 lbs ?  ?  ?Transmission reviewed.  ?  ?Corvue Thoracic impedance normal but suggesting intermittent days with possible fluid accumulation alternating with days at baseline. ?  ?Prescribed: ?Furosemide 20 mg take 2 tablets (40 mg total) daily. ?Spironolactone 25 mg take 0.5 tablet (12.5 mg total) daily ?  ?Labs: ?05/30/2020 Creatinine 1.30, BUN 21, Potassium 4.5, Sodium 136, GFR >60 ?05/28/2020 Creatinine 1.40, BUN 21, Potassium 4.8, Sodium 139, GFR 59  ?A complete set of results can be found in Results Review. ?  ?Recommendations:  No changes.  ?   ?Follow-up plan: ICM clinic phone appointment on 10/21/2021.  91 day device clinic remote transmission 12/09/2021.   ?  ?EP/Cardiology Office Visits: 01/07/2022 with Dr Wyline Mood.     ?  ?Copy of ICM check sent to Dr. Johney Frame.    ? ?3 month ICM trend: 09/16/2021. ? ? ? ?12-14 Month ICM trend:  ? ? ? ?Karie Soda, RN ?09/20/2021 ?4:05 PM ? ?

## 2021-09-23 NOTE — Progress Notes (Signed)
Remote ICD transmission.   

## 2021-10-18 NOTE — Progress Notes (Signed)
[facility] [id number] ?[facility] [facility] to [facility] for visit 10/22/21. ? ?No answer to Angelina Ok at [phone] Ext. [phone] ?

## 2021-10-21 ENCOUNTER — Ambulatory Visit (INDEPENDENT_AMBULATORY_CARE_PROVIDER_SITE_OTHER): Payer: No Typology Code available for payment source

## 2021-10-21 DIAGNOSIS — I5022 Chronic systolic (congestive) heart failure: Secondary | ICD-10-CM

## 2021-10-21 DIAGNOSIS — Z95810 Presence of automatic (implantable) cardiac defibrillator: Secondary | ICD-10-CM | POA: Diagnosis not present

## 2021-10-21 NOTE — Progress Notes (Deleted)
NEUROLOGY FOLLOW UP OFFICE NOTE  Dalton Porter [id number]  Assessment/Plan:     Residual left hemiparesis - possibly secondary to MRI-negative right hemispheric stroke.  Given associated headache, consider migraine but no prior history of migraines and still endorses mild but residual weakness. It seems that neurology in the hospital considered functional etiology as well.  Diabetic polyneuropathy Type 2 diabetes mellitus Hypertension Hyperlipidemia Nonischemic cardiomyopathy Tremor   1  Secondary stroke prevention as managed by PCP/cardiology: - ASA 325mg  daily - Statin.  LDL goal less than 70 - Glycemic control.  Hgb A1c goal less than 7 - Normotensive blood pressure 2  Mediterranean diet 3  Routine exercise 4  Follow up in office in 6 months.  Subjective:  Dalton Porter is a 66 year old right-handed male with nonischemic cardiomyopathy, CHF, type 2 diabetes mellitus and chronic back pain who follows up for cerebellar stroke.   UPDATE: Current medications:  ASA 325mg , atorvastatin 20mg , furosemide 40mg , metformin, Entresto, gabapentin 300mg  TID, B12   Weakness is not better but not worse.  Still notes tremor which is stable.   HISTORY: In October 2021.  He describes severe stabbing pain lasting 1-2 minutes followed by persistent dull non-throbbing headache in between a dull persistent non-throbbing headache.  He had a similar headache in June 2021 in which he went to the ED at [facility] where he workup included sed rate 45 and CT head showing small chronic infarct in the right occipital lobe but no acute abnormality.  Otherwise, reports only history of occasional tension or "sinus" headaches.  He presented to [facility] on 05/28/2020 after endorsing left-sided upper and lower extremity weakness and difficulty with getting words out for a week.  CTA of head and neck personally reviewed showed mild atherosclerotic disease involving the left carotid bifurcation, left  V4 vertebral artery and bilateral cavernous carotid arteries but no emergent large vessel occlusion or hemodynamically significant stenosis.  He was transferred to [facility] for AICD to be turned off for MRI.  MRI of brain personally reviewed showed mild chronic small vessel ischemic changes but no acute findings such as stroke.  MRI of cervical spine personally reviewed showed mild degenerative changes with mild foraminal narrowing but overall unremarkable.  He was evaluated by neurology who noted giveaway weakness on the left side.  Echocardiogram showed EF 40-45% with global hypokinesis and indeterminate left ventricular diastolic parameters.  Hgb A1c was 7.3 and LDL 17.  To further evaluate headache, he had an MRV of the head with and without contrast personally reviewed which was negative for dural sinus thrombosis.  Sed rate and CRP were mildly elevated at 67 and 3 respectively but not fitting picture of giant cell arteritis.  He endorses numbness in the feet.  Neuropathy labs, unremarkable, demonstrated negative ANA, SPEP/IFE negative for monoclonal gammopathy, B12 398, methylmalonic acid level 210, negative SSA/SSB antibodies.  CK was 85.  HIV negative.  He was discharged with increased ASA from 81mg  to 325mg  daily.  He underwent outpatient PT/OT/speech therapy which was helpful.  He still endorses some left sided weakness for which he uses a cane.  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   PAST MEDICAL HISTORY: Past Medical History:  Diagnosis Date   Back pain, chronic    CHF (congestive heart failure) (HCC)    Diabetes mellitus    Nonischemic cardiomyopathy (HCC)    a. EF 40% by  echo in 2003 b. 03/2016: cath showing normal cors and EF of 25-35%, EF 40-45% by echo with diffuse hypokinesis.   Stroke [facility])     MEDICATIONS: Current Outpatient Medications on File Prior to Visit  Medication Sig Dispense Refill   aspirin 325 MG tablet  Take 325 mg by mouth daily.     atorvastatin (LIPITOR) 20 MG tablet Take 20 mg by mouth daily.     Cholecalciferol (VITAMIN D3) 5000 units CAPS Take 1 capsule by mouth daily.     furosemide (LASIX) 20 MG tablet Take 2 tablets (40 mg total) by mouth daily. 270 tablet 0   gabapentin (NEURONTIN) 300 MG capsule Take 300 mg by mouth 3 (three) times daily.     glipiZIDE (GLUCOTROL) 5 MG tablet Take 5 mg by mouth 2 (two) times daily before a meal.     metFORMIN (GLUCOPHAGE) 500 MG tablet Take 500 mg by mouth 2 (two) times daily with a meal.     metoprolol succinate (TOPROL XL) 25 MG 24 hr tablet Take 1 tablet (25 mg total) by mouth 2 (two) times daily. 45 tablet 0   nitroGLYCERIN (NITROSTAT) 0.4 MG SL tablet Place 1 tablet (0.4 mg total) under the tongue every 5 (five) minutes as needed for chest pain. 25 tablet 3   sacubitril-valsartan (ENTRESTO) 49-51 MG Take 1 tablet by mouth 2 (two) times daily. 60 tablet 11   spironolactone (ALDACTONE) 25 MG tablet Take 12.5 mg by mouth daily.     TRAZODONE HCL PO Take 100 mg by mouth at bedtime.      vitamin B-12 (CYANOCOBALAMIN) 500 MCG tablet Take 1 tablet (500 mcg total) by mouth daily. 30 tablet 1   No current facility-administered medications on file prior to visit.    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  Patient appears well-groomed.   Head:  Normocephalic/atraumatic Eyes:  Fundi examined but not visualized Neck: supple, no paraspinal  tenderness, full range of motion Heart:  Regular rate and rhythm Lungs:  Clear to auscultation bilaterally Back: No paraspinal tenderness Neurological Exam: alert and oriented to person, place, and time.  Speech fluent and not dysarthric, language intact.  Endorses reduced left V1-V2 sensation.  Otherwise, CN II-XII intact. Bulk and tone normal.  Postural and kinetic tremor in hands, left greater than right; muscle strength 5-/5 left deltoid, left hip flexion, 4+/5 left ankle dorsiflexion and 2/5 bilateral EHL.  Otherwise, muscle strength 5/5 throughout.  Pinprick sensation reduced in left hand, left foot, reduced vibratory sensation in right foot.  Deep tendon reflexes 2+ throughout, toes downgoing.  Finger to nose testing intact.  Mildly unsteady, wobbling gait.  Romberg with sway   Shon Millet, DO  CC: Wallace Cullens, MD

## 2021-10-22 ENCOUNTER — Ambulatory Visit: Payer: No Typology Code available for payment source | Admitting: Neurology

## 2021-10-24 ENCOUNTER — Telehealth: Payer: Self-pay

## 2021-10-24 NOTE — Telephone Encounter (Signed)
Remote ICM transmission received.  Attempted call to patient regarding ICM remote transmission and left detailed message per DPR.  Advised to return call for any fluid symptoms or questions. Next ICM remote transmission scheduled 11/26/2021.   ? ?

## 2021-10-24 NOTE — Progress Notes (Signed)
EPIC Encounter for ICM Monitoring ? ?Patient Name: Dalton Porter is a 66 y.o. male ?Date: 10/24/2021 ?Primary Care Physican: Wallace Cullens, MD ?Primary Cardiologist: Wyline Mood ?Electrophysiologist: Allred ?08/29/2021 Office Weight: 243 lbs ?  ?  ?Attempted call to patient and unable to reach.  Left detailed message per DPR regarding transmission. Transmission reviewed.  ?  ?[place] Thoracic impedance normal but was suggesting possible fluid accumulation starting 4/19-4/23. ?  ?Prescribed: ?Furosemide 20 mg take 2 tablets (40 mg total) daily. ?Spironolactone 25 mg take 0.5 tablet (12.5 mg total) daily ?  ?Labs: ?05/30/2020 Creatinine 1.30, BUN 21, Potassium 4.5, Sodium 136, GFR >60 ?05/28/2020 Creatinine 1.40, BUN 21, Potassium 4.8, Sodium 139, GFR 59  ?A complete set of results can be found in Results Review. ?  ?Recommendations:  Left voice mail with ICM number and encouraged to call if experiencing any fluid symptoms. ?   ?Follow-up plan: ICM clinic phone appointment on 11/26/2021.  91 day device clinic remote transmission 12/09/2021.   ?  ?EP/Cardiology Office Visits: 01/07/2022 with Dr Wyline Mood.     ?  ?Copy of ICM check sent to Dr. Johney Frame.   ? ?3 month ICM trend: 10/21/2021. ? ? ? ?12-14 Month ICM trend:  ? ? ? ?Karie Soda, RN ?10/24/2021 ?3:47 PM ? ?

## 2021-11-06 NOTE — Progress Notes (Deleted)
NEUROLOGY FOLLOW UP OFFICE NOTE  Dalton Porter [id number]  Assessment/Plan:   Residual left hemiparesis - possibly secondary to MRI-negative right hemispheric stroke.  Given associated headache, consider migraine but no prior history of migraines and still endorses mild but residual weakness. It seems that neurology in the hospital considered functional etiology as well.  Type 2 diabetes mellitus with polyneuropathy Hypertension Hyperlipidemia Nonischemic cardiomyopathy Tremor   1  Secondary stroke prevention as managed by PCP/cardiology: - ASA 325mg  daily - Statin.  LDL goal less than 70 - Glycemic control.  Hgb A1c goal less than 7 - Normotensive blood pressure 2  Mediterranean diet 3  Routine exercise 4  Follow up in office in 6 months  Subjective:  Dalton Porter Porter is a 66 year old right-handed male with nonischemic cardiomyopathy, CHF, type 2 diabetes mellitus and chronic back pain who follows up for cerebellar stroke.   UPDATE: Current medications:  ASA 325mg , atorvastatin 20mg , furosemide 40mg , metformin, Entresto, gabapentin 300mg  TID, B12   Weakness is not better but not worse.  Still notes tremor which is stable.   HISTORY: In October 2021.  He describes severe stabbing pain lasting 1-2 minutes followed by persistent dull non-throbbing headache in between a dull persistent non-throbbing headache.  He had a similar headache in June 2021 in which he went to the ED at [facility] where he workup included sed rate 45 and CT head showing small chronic infarct in the right occipital lobe but no acute abnormality.  Otherwise, reports only history of occasional tension or "sinus" headaches.  He presented to [facility] on 05/28/2020 after endorsing left-sided upper and lower extremity weakness and difficulty with getting words out for a week.  CTA of head and neck personally reviewed showed mild atherosclerotic disease involving the left carotid bifurcation, left V4  vertebral artery and bilateral cavernous carotid arteries but no emergent large vessel occlusion or hemodynamically significant stenosis.  He was transferred to [facility] for AICD to be turned off for MRI.  MRI of brain personally reviewed showed mild chronic small vessel ischemic changes but no acute findings such as stroke.  MRI of cervical spine personally reviewed showed mild degenerative changes with mild foraminal narrowing but overall unremarkable.  He was evaluated by neurology who noted giveaway weakness on the left side.  Echocardiogram showed EF 40-45% with global hypokinesis and indeterminate left ventricular diastolic parameters.  Hgb A1c was 7.3 and LDL 17.  To further evaluate headache, he had an MRV of the head with and without contrast personally reviewed which was negative for dural sinus thrombosis.  Sed rate and CRP were mildly elevated at 67 and 3 respectively but not fitting picture of giant cell arteritis.  He endorses numbness in the feet.  Neuropathy labs, unremarkable, demonstrated negative ANA, SPEP/IFE negative for monoclonal gammopathy, B12 398, methylmalonic acid level 210, negative SSA/SSB antibodies.  CK was 85.  HIV negative.  He was discharged with increased ASA from 81mg  to 325mg  daily.  He underwent outpatient PT/OT/speech therapy which was helpful.  He still endorses some left sided weakness for which he uses a cane.  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   PAST MEDICAL HISTORY: Past Medical History:  Diagnosis Date   Back pain, chronic    CHF (congestive heart failure) (HCC)    Diabetes mellitus    Nonischemic cardiomyopathy (HCC)    a. EF 40% by echo in  2003 b. 03/2016: cath showing normal cors and EF of 25-35%, EF 40-45% by echo with diffuse hypokinesis.   Stroke [facility])     MEDICATIONS: Current Outpatient Medications on File Prior to Visit  Medication Sig Dispense Refill   aspirin 325 MG tablet Take  325 mg by mouth daily.     atorvastatin (LIPITOR) 20 MG tablet Take 20 mg by mouth daily.     Cholecalciferol (VITAMIN D3) 5000 units CAPS Take 1 capsule by mouth daily.     furosemide (LASIX) 20 MG tablet Take 2 tablets (40 mg total) by mouth daily. 270 tablet 0   gabapentin (NEURONTIN) 300 MG capsule Take 300 mg by mouth 3 (three) times daily.     glipiZIDE (GLUCOTROL) 5 MG tablet Take 5 mg by mouth 2 (two) times daily before a meal.     metFORMIN (GLUCOPHAGE) 500 MG tablet Take 500 mg by mouth 2 (two) times daily with a meal.     metoprolol succinate (TOPROL XL) 25 MG 24 hr tablet Take 1 tablet (25 mg total) by mouth 2 (two) times daily. 45 tablet 0   nitroGLYCERIN (NITROSTAT) 0.4 MG SL tablet Place 1 tablet (0.4 mg total) under the tongue every 5 (five) minutes as needed for chest pain. 25 tablet 3   sacubitril-valsartan (ENTRESTO) 49-51 MG Take 1 tablet by mouth 2 (two) times daily. 60 tablet 11   spironolactone (ALDACTONE) 25 MG tablet Take 12.5 mg by mouth daily.     TRAZODONE HCL PO Take 100 mg by mouth at bedtime.      vitamin B-12 (CYANOCOBALAMIN) 500 MCG tablet Take 1 tablet (500 mcg total) by mouth daily. 30 tablet 1   No current facility-administered medications on file prior to visit.    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  Patient appears well-groomed.   Head:  Normocephalic/atraumatic Eyes:  Fundi examined but not visualized Neck: supple, no paraspinal  tenderness, full range of motion Heart:  Regular rate and rhythm Neurological Exam: alert and oriented to person, place, and time.  Speech fluent and not dysarthric, language intact.  Endorses reduced left V1-V2 sensation.  Otherwise, CN II-XII intact. Bulk and tone normal, Postural and kinetic tremor in hands, left greater than right.  Muscle strength 5-/5 left deltoid, left hip flexion, 4+/5 left ankle dorsiflexion and 2/5 bilateral EHL, otherwise 5/5.  Sensation to pinprick reduced in let hand and left foot, vibratory sensation reduced in right foot. Deep tendon reflexes 2+ throughout, toes downgoing.  Finger to nose testing intact.  Mildly unsteady wobbling gait.  Romberg with sway   Shon Millet, DO  CC: Wallace Cullens, MD

## 2021-11-08 ENCOUNTER — Ambulatory Visit: Payer: No Typology Code available for payment source | Admitting: Neurology

## 2021-11-26 ENCOUNTER — Inpatient Hospital Stay (HOSPITAL_COMMUNITY)
Admission: EM | Admit: 2021-11-26 | Discharge: 2021-11-28 | DRG: 638 | Disposition: A | Discharge status: Home / Self Care | Payer: No Typology Code available for payment source | Attending: Internal Medicine | Admitting: Internal Medicine

## 2021-11-26 ENCOUNTER — Emergency Department (HOSPITAL_COMMUNITY): Payer: No Typology Code available for payment source

## 2021-11-26 ENCOUNTER — Encounter (HOSPITAL_COMMUNITY): Payer: Self-pay

## 2021-11-26 ENCOUNTER — Other Ambulatory Visit: Payer: Self-pay

## 2021-11-26 DIAGNOSIS — Z7982 Long term (current) use of aspirin: Secondary | ICD-10-CM | POA: Diagnosis not present

## 2021-11-26 DIAGNOSIS — R531 Weakness: Secondary | ICD-10-CM

## 2021-11-26 DIAGNOSIS — E1142 Type 2 diabetes mellitus with diabetic polyneuropathy: Secondary | ICD-10-CM | POA: Diagnosis present

## 2021-11-26 DIAGNOSIS — Z981 Arthrodesis status: Secondary | ICD-10-CM

## 2021-11-26 DIAGNOSIS — Z8673 Personal history of transient ischemic attack (TIA), and cerebral infarction without residual deficits: Secondary | ICD-10-CM | POA: Diagnosis not present

## 2021-11-26 DIAGNOSIS — E669 Obesity, unspecified: Secondary | ICD-10-CM | POA: Diagnosis present

## 2021-11-26 DIAGNOSIS — E1169 Type 2 diabetes mellitus with other specified complication: Principal | ICD-10-CM | POA: Diagnosis present

## 2021-11-26 DIAGNOSIS — Z9049 Acquired absence of other specified parts of digestive tract: Secondary | ICD-10-CM | POA: Diagnosis not present

## 2021-11-26 DIAGNOSIS — Z6833 Body mass index (BMI) 33.0-33.9, adult: Secondary | ICD-10-CM | POA: Diagnosis not present

## 2021-11-26 DIAGNOSIS — M869 Osteomyelitis, unspecified: Secondary | ICD-10-CM | POA: Diagnosis present

## 2021-11-26 DIAGNOSIS — I10 Essential (primary) hypertension: Secondary | ICD-10-CM | POA: Diagnosis not present

## 2021-11-26 DIAGNOSIS — I428 Other cardiomyopathies: Secondary | ICD-10-CM | POA: Diagnosis present

## 2021-11-26 DIAGNOSIS — E119 Type 2 diabetes mellitus without complications: Secondary | ICD-10-CM

## 2021-11-26 DIAGNOSIS — E1100 Type 2 diabetes mellitus with hyperosmolarity without nonketotic hyperglycemic-hyperosmolar coma (NKHHC): Secondary | ICD-10-CM | POA: Diagnosis not present

## 2021-11-26 DIAGNOSIS — M86171 Other acute osteomyelitis, right ankle and foot: Secondary | ICD-10-CM | POA: Diagnosis not present

## 2021-11-26 DIAGNOSIS — Z79899 Other long term (current) drug therapy: Secondary | ICD-10-CM

## 2021-11-26 DIAGNOSIS — Z7984 Long term (current) use of oral hypoglycemic drugs: Secondary | ICD-10-CM

## 2021-11-26 DIAGNOSIS — I69354 Hemiplegia and hemiparesis following cerebral infarction affecting left non-dominant side: Secondary | ICD-10-CM | POA: Diagnosis not present

## 2021-11-26 DIAGNOSIS — Z95810 Presence of automatic (implantable) cardiac defibrillator: Secondary | ICD-10-CM | POA: Diagnosis not present

## 2021-11-26 DIAGNOSIS — E782 Mixed hyperlipidemia: Secondary | ICD-10-CM

## 2021-11-26 DIAGNOSIS — Z89411 Acquired absence of right great toe: Secondary | ICD-10-CM | POA: Diagnosis not present

## 2021-11-26 DIAGNOSIS — E1149 Type 2 diabetes mellitus with other diabetic neurological complication: Secondary | ICD-10-CM | POA: Diagnosis present

## 2021-11-26 DIAGNOSIS — E785 Hyperlipidemia, unspecified: Secondary | ICD-10-CM

## 2021-11-26 HISTORY — DX: Hyperlipidemia, unspecified: E78.5

## 2021-11-26 LAB — HEMOGLOBIN A1C
Hgb A1c MFr Bld: 6.6 % — ABNORMAL HIGH (ref 4.8–5.6)
Mean Plasma Glucose: 142.72 mg/dL

## 2021-11-26 LAB — CBC
HCT: 35.5 % — ABNORMAL LOW (ref 39.0–52.0)
Hemoglobin: 11.3 g/dL — ABNORMAL LOW (ref 13.0–17.0)
MCH: 29.4 pg (ref 26.0–34.0)
MCHC: 31.8 g/dL (ref 30.0–36.0)
MCV: 92.2 fL (ref 80.0–100.0)
Platelets: 235 10*3/uL (ref 150–400)
RBC: 3.85 MIL/uL — ABNORMAL LOW (ref 4.22–5.81)
RDW: 13.9 % (ref 11.5–15.5)
WBC: 8.2 10*3/uL (ref 4.0–10.5)
nRBC: 0 % (ref 0.0–0.2)

## 2021-11-26 LAB — BASIC METABOLIC PANEL
Anion gap: 7 (ref 5–15)
BUN: 22 mg/dL (ref 8–23)
CO2: 26 mmol/L (ref 22–32)
Calcium: 8.7 mg/dL — ABNORMAL LOW (ref 8.9–10.3)
Chloride: 107 mmol/L (ref 98–111)
Creatinine, Ser: 1.23 mg/dL (ref 0.61–1.24)
GFR, Estimated: >60 mL/min (ref >60)
Glucose, Bld: 125 mg/dL — ABNORMAL HIGH (ref 70–99)
Potassium: 4.5 mmol/L (ref 3.5–5.1)
Sodium: 140 mmol/L (ref 135–145)

## 2021-11-26 LAB — GLUCOSE, CAPILLARY
Glucose-Capillary: 71 mg/dL (ref 70–99)
Glucose-Capillary: 169 mg/dL — ABNORMAL HIGH (ref 70–99)

## 2021-11-26 LAB — CBG MONITORING, ED: Glucose-Capillary: 213 mg/dL — ABNORMAL HIGH (ref 70–99)

## 2021-11-26 LAB — HIV ANTIBODY (ROUTINE TESTING W REFLEX): HIV Screen 4th Generation wRfx: NONREACTIVE

## 2021-11-26 MED ORDER — GLIPIZIDE 5 MG PO TABS
5.0000 mg | ORAL_TABLET | Freq: Two times a day (BID) | ORAL | Status: DC
  Administered 2021-11-26 – 2021-11-28 (×4): 5 mg via ORAL
  Filled 2021-11-26 (×4): qty 1

## 2021-11-26 MED ORDER — BISACODYL 5 MG PO TBEC: 5.0000 mg | DELAYED_RELEASE_TABLET | Freq: Every day | ORAL | Status: DC | PRN

## 2021-11-26 MED ORDER — HYDROMORPHONE HCL 1 MG/ML IJ SOLN: 0.5000 mg | INTRAMUSCULAR | Status: DC | PRN

## 2021-11-26 MED ORDER — VITAMIN D 25 MCG (1000 UNIT) PO TABS
5000.0000 [IU] | ORAL_TABLET | Freq: Every day | ORAL | Status: DC
  Administered 2021-11-26 – 2021-11-28 (×3): 5000 [IU] via ORAL
  Filled 2021-11-26 (×3): qty 5

## 2021-11-26 MED ORDER — VANCOMYCIN HCL IN DEXTROSE 1-5 GM/200ML-% IV SOLN
1000.0000 mg | Freq: Once | INTRAVENOUS | Status: AC
  Administered 2021-11-26: 1000 mg via INTRAVENOUS
  Filled 2021-11-26: qty 200

## 2021-11-26 MED ORDER — ACETAMINOPHEN 325 MG PO TABS: 650.0000 mg | ORAL_TABLET | Freq: Four times a day (QID) | ORAL | Status: DC | PRN

## 2021-11-26 MED ORDER — FUROSEMIDE 40 MG PO TABS
40.0000 mg | ORAL_TABLET | Freq: Every day | ORAL | Status: DC
  Administered 2021-11-27 – 2021-11-28 (×2): 40 mg via ORAL
  Filled 2021-11-26 (×2): qty 1

## 2021-11-26 MED ORDER — OXYCODONE HCL 5 MG PO TABS: 5.0000 mg | ORAL_TABLET | ORAL | Status: DC | PRN

## 2021-11-26 MED ORDER — ONDANSETRON HCL 4 MG PO TABS: 4.0000 mg | ORAL_TABLET | Freq: Four times a day (QID) | ORAL | Status: DC | PRN

## 2021-11-26 MED ORDER — PIPERACILLIN-TAZOBACTAM 3.375 G IVPB 30 MIN
3.3750 g | Freq: Once | INTRAVENOUS | Status: AC
  Administered 2021-11-26: 3.375 g via INTRAVENOUS
  Filled 2021-11-26: qty 50

## 2021-11-26 MED ORDER — GABAPENTIN 300 MG PO CAPS
300.0000 mg | ORAL_CAPSULE | Freq: Three times a day (TID) | ORAL | Status: DC
  Administered 2021-11-26 – 2021-11-28 (×6): 300 mg via ORAL
  Filled 2021-11-26 (×6): qty 1

## 2021-11-26 MED ORDER — ZOLPIDEM TARTRATE 5 MG PO TABS: 5.0000 mg | ORAL_TABLET | Freq: Every evening | ORAL | Status: DC | PRN

## 2021-11-26 MED ORDER — ATORVASTATIN CALCIUM 10 MG PO TABS
20.0000 mg | ORAL_TABLET | Freq: Every day | ORAL | Status: DC
  Administered 2021-11-26 – 2021-11-28 (×3): 20 mg via ORAL
  Filled 2021-11-26 (×4): qty 2

## 2021-11-26 MED ORDER — SODIUM CHLORIDE 0.9 % IV SOLN: INTRAVENOUS | Status: AC

## 2021-11-26 MED ORDER — ONDANSETRON HCL 4 MG/2ML IJ SOLN: 4.0000 mg | Freq: Four times a day (QID) | INTRAMUSCULAR | Status: DC | PRN

## 2021-11-26 MED ORDER — SODIUM CHLORIDE 0.9% FLUSH: 3.0000 mL | INTRAVENOUS | Status: DC | PRN

## 2021-11-26 MED ORDER — SACUBITRIL-VALSARTAN 49-51 MG PO TABS
1.0000 | ORAL_TABLET | Freq: Two times a day (BID) | ORAL | Status: DC
  Administered 2021-11-26 – 2021-11-28 (×4): 1 via ORAL
  Filled 2021-11-26 (×5): qty 1

## 2021-11-26 MED ORDER — TRAZODONE HCL 50 MG PO TABS
100.0000 mg | ORAL_TABLET | Freq: Every day | ORAL | Status: DC
  Administered 2021-11-26 – 2021-11-27 (×2): 100 mg via ORAL
  Filled 2021-11-26 (×2): qty 2

## 2021-11-26 MED ORDER — HYDRALAZINE HCL 20 MG/ML IJ SOLN: 10.0000 mg | INTRAMUSCULAR | Status: DC | PRN

## 2021-11-26 MED ORDER — HEPARIN SODIUM (PORCINE) 5000 UNIT/ML IJ SOLN
5000.0000 [IU] | Freq: Three times a day (TID) | INTRAMUSCULAR | Status: DC
  Administered 2021-11-26 – 2021-11-28 (×6): 5000 [IU] via SUBCUTANEOUS
  Filled 2021-11-26 (×6): qty 1

## 2021-11-26 MED ORDER — PIPERACILLIN-TAZOBACTAM 3.375 G IVPB
3.3750 g | Freq: Three times a day (TID) | INTRAVENOUS | Status: DC
  Administered 2021-11-26 – 2021-11-28 (×5): 3.375 g via INTRAVENOUS
  Filled 2021-11-26 (×5): qty 50

## 2021-11-26 MED ORDER — METOPROLOL SUCCINATE ER 25 MG PO TB24
25.0000 mg | ORAL_TABLET | Freq: Two times a day (BID) | ORAL | Status: DC
  Administered 2021-11-26 – 2021-11-28 (×4): 25 mg via ORAL
  Filled 2021-11-26 (×5): qty 1

## 2021-11-26 MED ORDER — SPIRONOLACTONE 12.5 MG HALF TABLET
12.5000 mg | ORAL_TABLET | Freq: Every day | ORAL | Status: DC
  Administered 2021-11-27 – 2021-11-28 (×2): 12.5 mg via ORAL
  Filled 2021-11-26 (×2): qty 1

## 2021-11-26 MED ORDER — VITAMIN B-12 100 MCG PO TABS
500.0000 ug | ORAL_TABLET | Freq: Every day | ORAL | Status: DC
  Administered 2021-11-27 – 2021-11-28 (×2): 500 ug via ORAL
  Filled 2021-11-26 (×2): qty 5

## 2021-11-26 MED ORDER — INSULIN ASPART 100 UNIT/ML IJ SOLN
0.0000 [IU] | Freq: Three times a day (TID) | INTRAMUSCULAR | Status: DC
  Administered 2021-11-27: 3 [IU] via SUBCUTANEOUS
  Administered 2021-11-28: 2 [IU] via SUBCUTANEOUS

## 2021-11-26 MED ORDER — SODIUM CHLORIDE 0.9 % IV SOLN: 250.0000 mL | INTRAVENOUS | Status: DC | PRN

## 2021-11-26 MED ORDER — VANCOMYCIN HCL 1500 MG/300ML IV SOLN
1500.0000 mg | INTRAVENOUS | Status: DC
  Administered 2021-11-27: 1500 mg via INTRAVENOUS
  Filled 2021-11-26: qty 300

## 2021-11-26 MED ORDER — SODIUM CHLORIDE 0.9 % IV SOLN
1.0000 g | Freq: Once | INTRAVENOUS | Status: AC
  Administered 2021-11-26: 1 g via INTRAVENOUS
  Filled 2021-11-26: qty 10

## 2021-11-26 MED ORDER — ACETAMINOPHEN 650 MG RE SUPP: 650.0000 mg | Freq: Four times a day (QID) | RECTAL | Status: DC | PRN

## 2021-11-26 MED ORDER — LEVALBUTEROL HCL 0.63 MG/3ML IN NEBU: 0.6300 mg | INHALATION_SOLUTION | Freq: Four times a day (QID) | RESPIRATORY_TRACT | Status: DC | PRN

## 2021-11-26 MED ORDER — SODIUM CHLORIDE 0.9% FLUSH
3.0000 mL | Freq: Two times a day (BID) | INTRAVENOUS | Status: DC
Start: 2021-11-26 — End: 2021-11-28
  Administered 2021-11-26 – 2021-11-27 (×3): 3 mL via INTRAVENOUS

## 2021-11-26 MED ORDER — DOXYCYCLINE HYCLATE 100 MG PO TABS
100.0000 mg | ORAL_TABLET | Freq: Once | ORAL | Status: AC
  Administered 2021-11-26: 100 mg via ORAL
  Filled 2021-11-26: qty 1

## 2021-11-26 MED ORDER — SODIUM CHLORIDE 0.9 % IV SOLN: INTRAVENOUS | Status: DC

## 2021-11-26 MED ORDER — NITROGLYCERIN 0.4 MG SL SUBL
0.4000 mg | SUBLINGUAL_TABLET | SUBLINGUAL | Status: DC | PRN
Start: 2021-11-26 — End: 2021-11-28

## 2021-11-26 MED ORDER — SENNOSIDES-DOCUSATE SODIUM 8.6-50 MG PO TABS: 1.0000 | ORAL_TABLET | Freq: Every evening | ORAL | Status: DC | PRN

## 2021-11-26 MED ORDER — IPRATROPIUM BROMIDE 0.02 % IN SOLN
0.5000 mg | Freq: Four times a day (QID) | RESPIRATORY_TRACT | Status: DC | PRN
Start: 2021-11-26 — End: 2021-11-28

## 2021-11-26 MED ORDER — SODIUM CHLORIDE 0.9% FLUSH
3.0000 mL | Freq: Two times a day (BID) | INTRAVENOUS | Status: DC
  Administered 2021-11-27: 3 mL via INTRAVENOUS

## 2021-11-26 NOTE — Assessment & Plan Note (Addendum)
-   Continue home medication of metoprolol, aspirin, -Holding Lasix for now >> likely will start in a.m. -Last echo from 2021 reviewed:showed EF 40-45% with global hypokinesis and indeterminate left ventricular diastolic parameters

## 2021-11-26 NOTE — Assessment & Plan Note (Signed)
-  Continue statins ?

## 2021-11-26 NOTE — Assessment & Plan Note (Signed)
-   Checking [facility] QA CHS, SSI coverage -Home medication of glipizide 5 mg p.o. twice daily -Holding home medication of metformin -A1c:

## 2021-11-26 NOTE — H&P (Signed)
History and Physical   Patient: Dalton Porter                            PCP: Wallace Cullens, MD                    DOB: [birth year]            DOA: 11/26/2021 EPP:[id number]             DOS: 11/26/2021, 12:50 PM  Wallace Cullens, MD  Patient coming from:   HOME  I have personally reviewed patient's medical records, in electronic medical records, including:  [facility], and care everywhere.    Chief Complaint:   Right second toe erythema edema  History of present illness:     Dalton Porter is a 66 year old male extensive history of diabetes mellitus type 2, HLD, HTN, diabetic neuropathy, history of diabetic foot previous toe amputation, presenting with increased swelling and redness of the right second toe. With no drainage.  Symptoms started over a week progressive getting worse.    ED course:  Blood pressure 117/62, pulse 72, temperature 98.2 F (36.8 C), temperature source Oral, resp. rate 18, height 5\' 11"  (1.803 m), weight 108.9 kg, SpO2 97 %.    Latest Ref Rng & Units 11/26/2021   10:42 AM 05/28/2020    2:23 PM 05/28/2020    2:20 PM  CBC  WBC 4.0 - 10.5 K/uL 8.2    7.9    Hemoglobin 13.0 - 17.0 g/dL 05/30/2020   60.6   30.1    Hematocrit 39.0 - 52.0 % 35.5   34.0   37.2    Platelets 150 - 400 K/uL 235    237        Latest Ref Rng & Units 11/26/2021   10:42 AM 05/30/2020    2:22 AM 05/28/2020    2:23 PM  BMP  Glucose 70 - 99 mg/dL 05/30/2020   093   78    BUN 8 - 23 mg/dL 22   21   21     Creatinine 0.61 - 1.24 mg/dL 235     5.73    Sodium 135 - 145 mmol/L 140   136   139    Potassium 3.5 - 5.1 mmol/L 4.5   4.5   4.8    Chloride 98 - 111 mmol/L 107   102   105    CO2 22 - 32 mmol/L 26   24     Calcium 8.9 - 10.3 mg/dL 8.7   9.3      Case was discussed by Dr. 2.20 with orthopedic Dr. 2.54 and general surgery Recommended patient to be admitted for IV antibiotics And will see the patient in consultation    Patient Denies having: Fever, Chills,  Cough, SOB, Chest Pain, Abd pain, N/V/D, headache, dizziness, lightheadedness,  Dysuria, Joint pain, rash, open wounds    Review of Systems: As per HPI, otherwise 10 point review of systems were negative.   ----------------------------------------------------------------------------------------------------------------------  Allergies  Allergen Reactions   Peanuts [Nuts] Anaphylaxis   Jardiance [Empagliflozin] Other (See Comments)    genital skin infection    Meat Extract     Alpha gal - different types of mammal meat - causes anything from SOB to anaphylaxis    Codeine Rash    headache    Home MEDs:  Prior to Admission medications   Medication Sig  Start Date End Date Taking? Authorizing Provider  aspirin 325 MG tablet Take 325 mg by mouth daily.   Yes [provider]  atorvastatin (LIPITOR) 20 MG tablet Take 20 mg by mouth daily.   Yes [provider]  Cholecalciferol (VITAMIN D3) 5000 units CAPS Take 1 capsule by mouth daily.   Yes [provider]  furosemide (LASIX) 20 MG tablet Take 2 tablets (40 mg total) by mouth daily. 06/21/19  Yes Strader, [place] M, PA-C  gabapentin (NEURONTIN) 300 MG capsule Take 300 mg by mouth 3 (three) times daily.   Yes [provider]  glipiZIDE (GLUCOTROL) 5 MG tablet Take 5 mg by mouth 2 (two) times daily before a meal.   Yes [provider]  metFORMIN (GLUCOPHAGE) 500 MG tablet Take 500 mg by mouth 2 (two) times daily with a meal.   Yes [provider]  metoprolol succinate (TOPROL XL) 25 MG 24 hr tablet Take 1 tablet (25 mg total) by mouth 2 (two) times daily. 04/13/19  Yes Strader, [place] M, PA-C  nitroGLYCERIN (NITROSTAT) 0.4 MG SL tablet Place 1 tablet (0.4 mg total) under the tongue every 5 (five) minutes as needed for chest pain. 04/13/19  Yes Strader, [place] M, PA-C  sacubitril-valsartan (ENTRESTO) 49-51 MG Take 1 tablet by mouth 2 (two) times daily. 04/12/21  Yes BranchDorothe Pea,  MD  spironolactone (ALDACTONE) 25 MG tablet Take 12.5 mg by mouth daily.   Yes [provider]  TRAZODONE HCL PO Take 100 mg by mouth at bedtime.    Yes [provider]  vitamin B-12 (CYANOCOBALAMIN) 500 MCG tablet Take 1 tablet (500 mcg total) by mouth daily. 06/01/20  Yes Kathlen Mody, MD    PRN MEDs: sodium chloride, acetaminophen **OR** acetaminophen, bisacodyl, hydrALAZINE, HYDROmorphone (DILAUDID) injection, ipratropium, levalbuterol, nitroGLYCERIN, ondansetron **OR** ondansetron (ZOFRAN) IV, oxyCODONE, senna-docusate, sodium chloride flush  Past Medical History:  Diagnosis Date   Back pain, chronic    CHF (congestive heart failure) (HCC)    Diabetes mellitus    Nonischemic cardiomyopathy (HCC)    a. EF 40% by echo in 2003 b. 03/2016: cath showing normal cors and EF of 25-35%, EF 40-45% by echo with diffuse hypokinesis.   Stroke [facility])     Past Surgical History:  Procedure Laterality Date   ANKLE SURGERY     APPENDECTOMY     BACK SURGERY     x3   CARDIAC CATHETERIZATION  2002   CARDIAC CATHETERIZATION N/A 04/08/2016   Procedure: Left Heart Cath and Coronary Angiography;  Surgeon: Peter M Swazil[place], MD;  Location: [facility] INVASIVE CV LAB;  Service: Cardiovascular;  Laterality: N/A;   CHOLECYSTECTOMY     ELBOW SURGERY     ICD IMPLANT  01/26/2018   SJM Fortify Assura VR ICD implanted by Dr Marily Memos for primary prevention of sudden death   THUMB FUSION       reports that he has never smoked. He has never used smokeless tobacco. He reports that he does not drink alcohol and does not use drugs.   Family History  Problem Relation Age of Onset   Stroke Other    Pancreatic cancer Father    Healthy Sister    Other Brother        died in a fire   Leukemia Maternal Grandmother    Healthy Sister     Physical Exam:   Vitals:   11/26/21 0928 11/26/21 0930 11/26/21 1000 11/26/21 1030  BP: 135/69 (!) 124/59 (!) 114/55 117/62  Pulse: 85 82 76 72  Resp: 18 18 18     Temp: 98.2 F (36.8 C)     TempSrc: Oral     SpO2: 98% 98% 98% 97%  Weight:      Height:       Constitutional: NAD, calm, comfortable Eyes: PERRL, lids and conjunctivae normal ENMT: Mucous membranes are moist. Posterior pharynx clear of any exudate or lesions.Normal dentition.  Neck: normal, supple, no masses, no thyromegaly Respiratory: clear to auscultation bilaterally, no wheezing, no crackles. Normal respiratory effort. No accessory muscle use.  Cardiovascular: Regular rate and rhythm, no murmurs / rubs / gallops. No extremity edema. 2+ pedal pulses. No carotid bruits.  Abdomen: no tenderness, no masses palpated. No hepatosplenomegaly. Bowel sounds positive.  Musculoskeletal: Right second toe erythema edema, right first toe amputated Mild left-sided weakness no clubbing / cyanosis. No joint deformity upper and lower extremities. Good ROM, no contractures. Normal muscle tone.  Neurologic: CN II-XII grossly intact.  Mild left-sided weakness compared to right . sensation intact, DTR normal. Strength 5/5 in all 4.  Psychiatric: Normal judgment and insight. Alert and oriented x 3. Normal mood.  Skin: no rashes, lesions, ulcers. No induration Decubitus/ulcers:  Wounds: per nursing documentation         Labs on admission:    I have personally reviewed following labs and imaging studies  CBC: Recent Labs  Lab 11/26/21 1042  WBC 8.2  HGB 11.3*  HCT 35.5*  MCV 92.2  PLT 235   Basic Metabolic Panel: Recent Labs  Lab 11/26/21 1042  NA 140  K 4.5  CL 107  CO2 26  GLUCOSE 125*  BUN 22  CREATININE 1.23  CALCIUM 8.7*    No results for input(s): HGBA1C in the last 72 hours. CBG: Recent Labs  Lab 11/26/21 1002  GLUCAP 213*    Urine analysis:    Component Value Date/Time   COLORURINE YELLOW 05/28/2020 1310   APPEARANCEUR CLEAR 05/28/2020 1310   LABSPEC 1.012 05/28/2020 1310   PHURINE 5.0 05/28/2020 1310   GLUCOSEU NEGATIVE 05/28/2020 1310   HGBUR NEGATIVE  05/28/2020 1310   BILIRUBINUR NEGATIVE 05/28/2020 1310   KETONESUR NEGATIVE 05/28/2020 1310   PROTEINUR NEGATIVE 05/28/2020 1310   NITRITE NEGATIVE 05/28/2020 1310   LEUKOCYTESUR NEGATIVE 05/28/2020 1310    Last A1C:  Lab Results  Component Value Date   HGBA1C 7.3 (H) 05/28/2020     Radiologic Exams on Admission:   DG Foot Complete Right  Result Date: 11/26/2021 CLINICAL DATA:  Pain, swelling and redness of the right second toe. History of prior foot and ankle surgery. EXAM: RIGHT FOOT COMPLETE - 3+ VIEW COMPARISON:  None Available. FINDINGS: Prior fifth ray resection. Fixation hardware noted on the fourth metatarsal. Moderate tibiotalar, subtalar and midfoot degenerative changes. Focal area of cortical disruption and destructive bony changes involving the medial aspect of the middle phalanx of the second toe worrisome for osteomyelitis. The other bony structures are intact. IMPRESSION: 1. Focal area of cortical disruption and destructive bony changes involving the medial aspect of the middle phalanx of the second toe worrisome for osteomyelitis. 2. Moderate tibiotalar, subtalar and midfoot degenerative changes. Electronically Signed   By: Rudie Meyer M.D.   On: 11/26/2021 10:15    EKG:   Independently reviewed.  Orders placed or performed during the hospital encounter of 11/26/21   EKG 12-Lead   ---------------------------------------------------------------------------------------------------------------------------------------    Assessment / Plan:   Principal Problem:   Osteomyelitis (HCC) Active Problems:   Diabetes  mellitus type 2 with neurological manifestations (HCC)   HTN (hypertension)   DMII (diabetes mellitus, type 2) (HCC)   Nonischemic cardiomyopathy (HCC)   History of stroke   Left-sided weakness   Hyperlipidemia   Assessment and Plan: * Osteomyelitis (HCC) - X-ray of the left foot positive for changes consistent with osteomyelitis of the right second  toe -ED physician Dr. Hyacinth Meeker has discussed the case with orthopedic Dr. Romeo Apple and general surgery who recommend patient to be admitted for IV antibiotics -IV antibiotics of vancomycin and Zosyn initiated -Gentle IV fluid hydration for 12-hour -Blood cultures -General surgery evaluation >>   DMII (diabetes mellitus, type 2) (HCC) - Checking CBG QA CHS, SSI coverage -Home medication of glipizide 5 mg p.o. twice daily -Holding home medication of metformin -A1c:   HTN (hypertension) -Stable-  Continue home medication including metoprolol, spironolactone, Entresto, -As needed hydralazine  Diabetes mellitus type 2 with neurological manifestations (HCC) - Continue Neurontin  Hyperlipidemia - Continue statins  Left-sided weakness - Stable-from previous stroke-  no changes  History of stroke - With sided weakness, continue aspirin, continue Lipitor  Nonischemic cardiomyopathy (HCC) - Continue home medication of metoprolol, aspirin, -Holding Lasix for now >> likely will start in a.m. -Last echo from 2021 reviewed:showed EF 40-45% with global hypokinesis and indeterminate left ventricular diastolic parameters          Consults called:   General surgery -------------------------------------------------------------------------------------------------------------------------------------------- DVT prophylaxis:  heparin injection 5,000 Units Start: 11/26/21 1400 TED hose Start: 11/26/21 1223 SCDs Start: 11/26/21 1223   Code Status:   Code Status: Full Code   Admission status: Patient will be admitted as Inpatient, with a greater than 2 midnight length of stay. Level of care: Med-Surg   Family Communication:  none at bedside  (The above findings and plan of care has been discussed with patient in detail, the patient expressed understanding and agreement of above plan)   --------------------------------------------------------------------------------------------------------------------------------------------------  Disposition Plan:  Anticipated 1-2 days Status is: Inpatient Remains inpatient appropriate because: Needing IV antibiotic for osteomyelitis     ----------------------------------------------------------------------------------------------------------------------------------------------------  Time spent: > than  55  Min.   SIGNED: Kendell Bane, MD, FHM. [facility],  Pager (Please use [email or web address] to page to text)  If 7PM-7AM, please contact night-coverage [email or web address],  11/26/2021, 12:50 PM

## 2021-11-26 NOTE — Assessment & Plan Note (Addendum)
-   Stable-from previous stroke-  no changes

## 2021-11-26 NOTE — Assessment & Plan Note (Signed)
Continue Neurontin. 

## 2021-11-26 NOTE — Hospital Course (Addendum)
  Dalton Porter is a 66 year old male extensive history of diabetes mellitus type 2, HLD, HTN, diabetic neuropathy, history of diabetic foot previous toe amputation, presenting with increased swelling and redness of the right second toe. With no drainage.  Symptoms started over a week progressive getting worse.    ED course:  Blood pressure 117/62, pulse 72, temperature 98.2 F (36.8 C), temperature source Oral, resp. rate 18, height 5\' 11"  (1.803 m), weight 108.9 kg, SpO2 97 %.    Latest Ref Rng & Units 11/26/2021   10:42 AM 05/28/2020    2:23 PM 05/28/2020    2:20 PM  CBC  WBC 4.0 - 10.5 K/uL 8.2    7.9    Hemoglobin 13.0 - 17.0 g/dL 05/30/2020   31.5   94.5    Hematocrit 39.0 - 52.0 % 35.5   34.0   37.2    Platelets 150 - 400 K/uL 235    237        Latest Ref Rng & Units 11/26/2021   10:42 AM 05/30/2020    2:22 AM 05/28/2020    2:23 PM  BMP  Glucose 70 - 99 mg/dL 05/30/2020   292   78    BUN 8 - 23 mg/dL 22   21   21     Creatinine 0.61 - 1.24 mg/dL 446     2.86    Sodium 135 - 145 mmol/L 140   136   139    Potassium 3.5 - 5.1 mmol/L 4.5   4.5   4.8    Chloride 98 - 111 mmol/L 107   102   105    CO2 22 - 32 mmol/L 26   24     Calcium 8.9 - 10.3 mg/dL 8.7   9.3      Case was discussed by Dr. 3.81 with orthopedic Dr. 7.71 and general surgery Recommended patient to be admitted for IV antibiotics And will see the patient in consultation

## 2021-11-26 NOTE — Assessment & Plan Note (Addendum)
-   X-ray of the left foot positive for changes consistent with osteomyelitis of the right second toe -We do not believe the patient needs an MRI at this point as findings are obvious... Unless patient is recommended for amputation.  -ED physician Dr. Hyacinth Meeker has discussed the case with orthopedic Dr. Romeo Apple and general surgery who recommend patient to be admitted for IV antibiotics -IV antibiotics of vancomycin and Zosyn initiated -Gentle IV fluid hydration for 12-hour -Blood cultures -General surgery evaluation >>

## 2021-11-26 NOTE — Assessment & Plan Note (Signed)
-   With sided weakness, continue aspirin, continue Lipitor

## 2021-11-26 NOTE — Consult Note (Signed)
[facility] Consult  Reason for Consult: Right second toe osteomyelitis Referring Physician: Dr. Sabra Heck    HPI: Dalton Porter is a 66 y.o. male who presents with a 1 week history of increasing erythema to the right second toe.  He first noted the erythema a week ago, and it has progressively become more red.  He has minimal feeling in this foot secondary to diabetic neuropathy, so he has not had any associated pain with the area.  He had a history of foot surgery after he broke his foot, and then he subsequently developed a blister on his right fifth toe and a cellulitis.  At that time, he developed osteomyelitis, and subsequently underwent a right fifth toe amputation.  He has been afebrile and denies any chills.  He has no other complaints at this time.  He has a surgical history significant for multiple implants in different joints, appendectomy, cholecystectomy.  He did have a heart catheterization and is on a full dose aspirin both 325 mg.  He denies use of tobacco products, alcohol, and illicit drugs.  In the ED, he underwent a right foot x-ray, which demonstrated focal area of cortical disruption and destructive bony changes of the medial aspect of the middle phalanx of the second toe, worrisome for osteomyelitis.  He has no leukocytosis on blood work.  Past Medical History:  Diagnosis Date   Back pain, chronic    CHF (congestive heart failure) (HCC)    Diabetes mellitus    Nonischemic cardiomyopathy ([place])    a. EF 40% by echo in 2003 b. 03/2016: cath showing normal cors and EF of 25-35%, EF 40-45% by echo with diffuse hypokinesis.   Stroke [facility])     Past Surgical History:  Procedure Laterality Date   ANKLE SURGERY     APPENDECTOMY     BACK SURGERY     x3   CARDIAC CATHETERIZATION  2002   CARDIAC CATHETERIZATION N/A 04/08/2016   Procedure: Left Heart Cath and Coronary Angiography;  Surgeon: Peter M Martinique, MD;  Location: [place] CV LAB;  Service:  Cardiovascular;  Laterality: N/A;   CHOLECYSTECTOMY     ELBOW SURGERY     ICD IMPLANT  01/26/2018   SJM Fortify Assura VR ICD implanted by Dr [name] for primary prevention of sudden death   THUMB FUSION      Family History  Problem Relation Age of Onset   Stroke Other    Pancreatic cancer Father    Healthy Sister    Other Brother        died in a fire   Leukemia Maternal Grandmother    Healthy Sister     Social History   Tobacco Use   Smoking status: Never   Smokeless tobacco: Never  Vaping Use   Vaping Use: Never used  Substance Use Topics   Alcohol use: No   Drug use: No    Medications: I have reviewed the patient's current medications.  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 rate 18, height 5\' 11"  (1.803 m), weight 109.2 kg, SpO2 100 %. Physical Exam Vitals reviewed.  Constitutional:      Appearance: Normal appearance.  HENT:     Head: Normocephalic and atraumatic.  Eyes:     Extraocular Movements: Extraocular movements intact.     Pupils: Pupils are equal, round, and reactive to light.  Cardiovascular:     Rate and Rhythm: Normal rate.     Pulses:          Dorsalis pedis pulses are 2+ on the right side and 2+ on the left side.       Posterior tibial pulses are 2+ on the right side and 2+ on the left side.  Pulmonary:     Effort:  Pulmonary effort is normal.  Abdominal:     General: There is no distension.     Palpations: Abdomen is soft.     Tenderness: There is no abdominal tenderness.  Musculoskeletal:        General: Normal range of motion.     Cervical back: Normal range of motion.  Feet:     Comments: Right foot second toe erythematous without significant swelling, nontender to palpation, no open wounds on the toe or foot Neurological:     General: No focal deficit present.     Mental Status: He is alert and oriented to person, place, and time.  Psychiatric:        Mood and Affect: Mood normal.        Behavior: Behavior normal.    Results: Results for orders placed or performed during the hospital encounter of 11/26/21 (from the past 48 hour(s))  CBG monitoring, ED     Status: Abnormal   Collection Time: 11/26/21 10:02 AM  Result Value Ref Range   Glucose-Capillary 213 (H) 70 - 99 mg/dL    Comment: Glucose reference range applies only to samples taken after fasting for at least 8 hours.  CBC     Status: Abnormal   Collection Time: 11/26/21 10:42 AM  Result Value Ref Range   WBC 8.2 4.0 - 10.5 K/uL   RBC 3.85 (L) 4.22 - 5.81 MIL/uL   Hemoglobin 11.3 (L) 13.0 - 17.0 g/dL   HCT 35.5 (L) 39.0 - 52.0 %   MCV 92.2 80.0 - 100.0 fL   MCH 29.4 26.0 - 34.0 pg   MCHC 31.8 30.0 - 36.0 g/dL   RDW 13.9 11.5 - 15.5 %   Platelets 235 150 - 400 K/uL   nRBC 0.0 0.0 - 0.2 %    Comment: Performed at [facility], [address] [place],  [id number]  Basic metabolic panel     Status: Abnormal   Collection Time: 11/26/21 10:42 AM  Result Value Ref Range   Sodium 140 135 - 145 mmol/L   Potassium 4.5 3.5 - 5.1 mmol/L   Chloride 107 98 - 111 mmol/L   CO2 26 22 - 32 mmol/L   Glucose, Bld 125 (H) 70 - 99 mg/dL    Comment: Glucose reference range applies only to samples taken after fasting for at least 8 hours.   BUN 22 8 - 23 mg/dL   Creatinine, Ser 1.23 0.61 - 1.24 mg/dL   Calcium 8.7 (L) 8.9 - 10.3 mg/dL    GFR, Estimated >60 >60 mL/min    Comment: (NOTE) Calculated using  the CKD-EPI Creatinine Equation (2021)    Anion gap 7 5 - 15    Comment: Performed at [facility], [address], [place], [place] [address]  Hemoglobin A1c     Status: Abnormal   Collection Time: 11/26/21 10:42 AM  Result Value Ref Range   Hgb A1c MFr Bld 6.6 (H) 4.8 - 5.6 %    Comment: (NOTE) Pre diabetes:          5.7%-6.4%  Diabetes:              >6.4%  Glycemic control for   <7.0% adults with diabetes    Mean Plasma Glucose 142.72 mg/dL    Comment: Performed at [facility] [address] [place], [place] [address]  Culture, blood (Routine X 2) w Reflex to ID Panel     Status: None (Preliminary result)   Collection Time: 11/26/21  1:39 PM   Specimen: Left Antecubital; Blood  Result Value Ref Range   Specimen Description LEFT ANTECUBITAL    Special Requests      BOTTLES DRAWN AEROBIC AND ANAEROBIC Blood Culture results may not be optimal due to an excessive volume of blood received in culture bottles Performed at [facility], [address], [place], [place] [address]    Culture PENDING    Report Status PENDING   Culture, blood (Routine X 2) w Reflex to ID Panel     Status: None (Preliminary result)   Collection Time: 11/26/21  1:39 PM   Specimen: BLOOD LEFT HAND  Result Value Ref Range   Specimen Description BLOOD LEFT HAND    Special Requests      BOTTLES DRAWN AEROBIC AND ANAEROBIC Blood Culture adequate volume Performed at [facility], [address] [place], [place] [address]    Culture PENDING    Report Status PENDING     DG Foot Complete Right  Result Date: 11/26/2021 CLINICAL DATA:  Pain, swelling and redness of the right second toe. History of prior foot and ankle surgery. EXAM: RIGHT FOOT COMPLETE - 3+ VIEW COMPARISON:  None Available. FINDINGS: Prior fifth ray resection. Fixation hardware noted on the fourth metatarsal. Moderate tibiotalar, subtalar and midfoot degenerative  changes. Focal area of cortical disruption and destructive bony changes involving the medial aspect of the middle phalanx of the second toe worrisome for osteomyelitis. The other bony structures are intact. IMPRESSION: 1. Focal area of cortical disruption and destructive bony changes involving the medial aspect of the middle phalanx of the second toe worrisome for osteomyelitis. 2. Moderate tibiotalar, subtalar and midfoot degenerative changes. Electronically Signed   By: Marijo Sanes M.D.   On: 11/26/2021 10:15     Assessment & Plan:  Dalton Porter is a 67 y.o. male who presents with erythema of his right second toe.  Foot x-ray concerning for osteomyelitis at the second toe.  -Given that there is no overlying swelling or fluid collections, I explained that we can hold off on any kind of surgical debridement or amputation at this time -I recommend the patient be admitted for IV antibiotics, and subsequent likely discharge home with a prolonged course of antibiotics -He may need amputation of this toe in the future, if the osteomyelitis fails to improve with conservative management -I did explain that it is very unlikely that this area will progress to him requiring a foot amputation, but he may require toe amputation if it fails to improve.   -I would recommend he follow-up with podiatry outpatient  for continued monitoring of his toe after discharge and evaluation for need for possible amputation in the future -IV vancomycin and Zosyn -Okay for regular diet -As needed pain control and antiemetics -Appreciate hospitalist recommendations  All questions were answered to the satisfaction of the patient and family.  -- Graciella Freer, DO [facility] [address] [place],  [id number] [phone] (office)

## 2021-11-26 NOTE — ED Triage Notes (Signed)
Pt states his second toe on right foot is turning purple x 1 week.

## 2021-11-26 NOTE — Assessment & Plan Note (Signed)
-  Stable-  Continue home medication including metoprolol, spironolactone, Entresto, -As needed hydralazine

## 2021-11-26 NOTE — ED Provider Notes (Signed)
[facility] EMERGENCY DEPARTMENT Provider Note   CSN: [id number] Arrival date & time: 11/26/21  2951     History  No chief complaint on file.   Dalton Porter is a 66 y.o. male.  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  He presents back to the hospital today with increasing redness and slight swelling to his right second toe, this is been going on for about a week, there is no pain or fever or numbness, no drainage, minimal swelling.  When he called the [place] today they asked him to go to the nearest emergency department.  He has no systemic symptoms and otherwise appears well  Home Medications Prior to Admission medications   Medication Sig Start Date End Date Taking? Authorizing Provider  aspirin 325 MG tablet Take 325 mg by mouth daily.   Yes [provider]  atorvastatin (LIPITOR) 20 MG tablet Take 20 mg by mouth daily.   Yes [provider]  Cholecalciferol (VITAMIN D3) 5000 units CAPS Take 1 capsule by mouth daily.   Yes [provider]  furosemide (LASIX) 20 MG tablet Take 2 tablets (40 mg total) by mouth daily. 06/21/19  Yes Strader, [place] M, PA-C  gabapentin (NEURONTIN) 300 MG capsule Take 300 mg by mouth 3 (three) times daily.   Yes [provider]  glipiZIDE (GLUCOTROL) 5 MG tablet Take 5 mg by mouth 2 (two) times daily before a meal.   Yes [provider]  metFORMIN (GLUCOPHAGE) 500 MG tablet Take 500 mg by mouth 2 (two) times daily with a meal.   Yes [provider]  metoprolol succinate (TOPROL XL) 25 MG 24 hr tablet Take 1 tablet (25 mg total) by mouth 2 (two) times daily. 04/13/19  Yes Strader, [place] M, PA-C  nitroGLYCERIN (NITROSTAT) 0.4 MG SL  tablet Place 1 tablet (0.4 mg total) under the tongue every 5 (five) minutes as needed for chest pain. 04/13/19  Yes Strader, [place] M, PA-C  sacubitril-valsartan (ENTRESTO) 49-51 MG Take 1 tablet by mouth 2 (two) times daily. 04/12/21  Yes BranchDorothe Pea, MD  spironolactone (ALDACTONE) 25 MG tablet Take 12.5 mg by mouth daily.   Yes [provider]  TRAZODONE HCL PO Take 100 mg by mouth at bedtime.    Yes [provider]  vitamin B-12 (CYANOCOBALAMIN) 500 MCG tablet Take 1 tablet (500 mcg total) by mouth daily. 06/01/20  Yes Kathlen Mody, MD      Allergies    Peanuts [nuts], Jardiance [empagliflozin], Meat extract, and Codeine    Review of Systems   Review of Systems  All other systems reviewed and are negative.  Physical Exam Updated Vital Signs BP 117/62   Pulse 72   Temp 98.2 F (36.8 C) (Oral)   Resp 18   Ht 1.803 m (5\' 11" )   Wt 108.9 kg   SpO2 97%   BMI 33.47 kg/m  Physical Exam Vitals and nursing note reviewed.  Constitutional:      General: He is not in acute distress.    Appearance: He is well-developed.  HENT:     Head: Normocephalic and atraumatic.     Mouth/Throat:     Pharynx: No oropharyngeal exudate.  Eyes:  General: No scleral icterus.       Right eye: No discharge.        Left eye: No discharge.     Conjunctiva/sclera: Conjunctivae normal.     Pupils: Pupils are equal, round, and reactive to light.  Neck:     Thyroid: No thyromegaly.     Vascular: No JVD.  Cardiovascular:     Rate and Rhythm: Normal rate and regular rhythm.     Heart sounds: Normal heart sounds. No murmur heard.   No friction rub. No gallop.  Pulmonary:     Effort: Pulmonary effort is normal. No respiratory distress.     Breath sounds: Normal breath sounds. No wheezing or rales.  Abdominal:     General: Bowel sounds are normal. There is no distension.     Palpations: Abdomen is soft. There is no mass.     Tenderness: There is no abdominal tenderness.   Musculoskeletal:        General: No tenderness. Normal range of motion.     Cervical back: Normal range of motion and neck supple.     Right lower leg: No edema.     Left lower leg: No edema.     Comments: There is mild swelling and redness of the toe of the right foot, no significant purulence, no fluctuance, no tenderness, no ulcerations, no open wounds, no foul smell  Lymphadenopathy:     Cervical: No cervical adenopathy.  Skin:    General: Skin is warm and dry.     Findings: No erythema or rash.  Neurological:     Mental Status: He is alert.     Coordination: Coordination normal.  Psychiatric:        Behavior: Behavior normal.    ED Results / Procedures / Treatments   Labs (all labs ordered are listed, but only abnormal results are displayed) Labs Reviewed  CBC - Abnormal; Notable for the following components:      Result Value   RBC 3.85 (*)    Hemoglobin 11.3 (*)    HCT 35.5 (*)    All other components within normal limits  BASIC METABOLIC PANEL - Abnormal; Notable for the following components:   Glucose, Bld 125 (*)    Calcium 8.7 (*)    All other components within normal limits  CBG MONITORING, ED - Abnormal; Notable for the following components:   Glucose-Capillary 213 (*)    All other components within normal limits  CULTURE, BLOOD (ROUTINE X 2)  CULTURE, BLOOD (ROUTINE X 2)  CBC  CREATININE, SERUM  HIV ANTIBODY (ROUTINE TESTING W REFLEX)  HEMOGLOBIN A1C    EKG None  Radiology DG Foot Complete Right  Result Date: 11/26/2021 CLINICAL DATA:  Pain, swelling and redness of the right second toe. History of prior foot and ankle surgery. EXAM: RIGHT FOOT COMPLETE - 3+ VIEW COMPARISON:  None Available. FINDINGS: Prior fifth ray resection. Fixation hardware noted on the fourth metatarsal. Moderate tibiotalar, subtalar and midfoot degenerative changes. Focal area of cortical disruption and destructive bony changes involving the medial aspect of the middle phalanx of  the second toe worrisome for osteomyelitis. The other bony structures are intact. IMPRESSION: 1. Focal area of cortical disruption and destructive bony changes involving the medial aspect of the middle phalanx of the second toe worrisome for osteomyelitis. 2. Moderate tibiotalar, subtalar and midfoot degenerative changes. Electronically Signed   By: Rudie Meyer M.D.   On: 11/26/2021 10:15    Procedures Procedures  Medications Ordered in ED Medications  vancomycin (VANCOCIN) IVPB 1000 mg/200 mL premix (1,000 mg Intravenous New Bag/Given 11/26/21 1203)  heparin injection 5,000 Units (has no administration in time range)  sodium chloride flush (NS) 0.9 % injection 3 mL (has no administration in time range)  0.9 %  sodium chloride infusion (has no administration in time range)  sodium chloride flush (NS) 0.9 % injection 3 mL (has no administration in time range)  sodium chloride flush (NS) 0.9 % injection 3 mL (has no administration in time range)  0.9 %  sodium chloride infusion (has no administration in time range)  acetaminophen (TYLENOL) tablet 650 mg (has no administration in time range)    Or  acetaminophen (TYLENOL) suppository 650 mg (has no administration in time range)  oxyCODONE (Oxy IR/ROXICODONE) immediate release tablet 5 mg (has no administration in time range)  HYDROmorphone (DILAUDID) injection 0.5-1 mg (has no administration in time range)  zolpidem (AMBIEN) tablet 5 mg (has no administration in time range)  senna-docusate (Senokot-S) tablet 1 tablet (has no administration in time range)  bisacodyl (DULCOLAX) EC tablet 5 mg (has no administration in time range)  ondansetron (ZOFRAN) tablet 4 mg (has no administration in time range)    Or  ondansetron (ZOFRAN) injection 4 mg (has no administration in time range)  ipratropium (ATROVENT) nebulizer solution 0.5 mg (has no administration in time range)  levalbuterol (XOPENEX) nebulizer solution 0.63 mg (has no administration in  time range)  hydrALAZINE (APRESOLINE) injection 10 mg (has no administration in time range)  insulin aspart (novoLOG) injection 0-9 Units (has no administration in time range)  doxycycline (VIBRA-TABS) tablet 100 mg (100 mg Oral Given 11/26/21 1014)  cefTRIAXone (ROCEPHIN) 1 g in sodium chloride 0.9 % 100 mL IVPB (0 g Intravenous Stopped 11/26/21 1201)    ED Course/ Medical Decision Making/ A&P Clinical Course as of 11/26/21 1231  Tue Nov 26, 2021  1026 DG Foot Complete Right [BM]    Clinical Course User Index [BM] Eber Hong, MD                           Medical Decision Making Amount and/or Complexity of Data Reviewed Labs: ordered. Radiology: ordered. Decision-making details documented in ED Course.  Risk Prescription drug management. Decision regarding hospitalization.   There is no obvious signs of significant swelling or abscess, concern for osteomyelitis given his diabetic neuropathy and the appearance of the toe, vital signs normal, patient well-appearing  Labs unremarkable without leukocytosis or significant metabolic abnormalities, CBG is unremarkable, x-ray does not fact confirm that the patient has osteomyelitis based on my personal interpretation of the x-rays and I agree with the radiologist.  Antibiotics ordered, discussed with orthopedics who recommends general surgery, or discussed with general surgery who recommends admission, discussed with hospitalist who will admit.        Final Clinical Impression(s) / ED Diagnoses Final diagnoses:  Osteomyelitis of right foot, unspecified type [facility])    Rx / DC Orders ED Discharge Orders     None         Eber Hong, MD 11/26/21 1231

## 2021-11-26 NOTE — Progress Notes (Signed)
OT Cancellation Note  Patient Details Name: Dalton Porter MRN: [id number] DOB: 1955-11-30   Cancelled Treatment:    Reason Eval/Treat Not Completed: OT screened, no needs identified, will sign off. Pt up walking to toilet with PT in room. Pt able to ambulate in room without assist and demonstrated WFL B UE A/ROM. Pt reports feeling back to normal. Pt will be removed from OT list.   Larey Seat OT, MOT   Larey Seat 11/26/2021, 3:28 PM

## 2021-11-26 NOTE — Evaluation (Signed)
Physical Therapy Evaluation Patient Details Name: Dalton Porter MRN: [id number] DOB: [birth year] Today's Date: 11/26/2021  History of Present Illness  Dalton Porter is a 66 year old male extensive history of diabetes mellitus type 2, HLD, HTN, diabetic neuropathy, history of diabetic foot previous toe amputation, presenting with increased swelling and redness of the right second toe.  With no drainage.  Symptoms started over a week progressive getting worse.   Clinical Impression  Patient is independent with bed mobility and transfers demonstrating appropriate strength and balance with performing tasks during assessment. Patient was able to ambulate for 100 feet independently with no AD needed. Patient demonstrated minor gait deficits but does not impede ability to ambulate close to WNL. Patient demonstrates good return for home with ability to perform ADL's and ambulate with little to no difficulty indicating patient is operating at or close to baseline function. Patient discharged to care of nursing for ambulation daily as tolerated for length of stay.       Recommendations for follow up therapy are one component of a multi-disciplinary discharge planning process, led by the attending physician.  Recommendations may be updated based on patient status, additional functional criteria and insurance authorization.  Follow Up Recommendations No PT follow up    Assistance Recommended at Discharge PRN  Patient can return home with the following  Help with stairs or ramp for entrance;A little help with walking and/or transfers    Equipment Recommendations None recommended by PT  Recommendations for Other Services       Functional Status Assessment Patient has not had a recent decline in their functional status     Precautions / Restrictions Precautions Precautions: None Restrictions Weight Bearing Restrictions: No      Mobility  Bed Mobility Overal bed mobility: Independent              General bed mobility comments: Patient able to perform supine to sit independently    Transfers Overall transfer level: Independent Equipment used: None               General transfer comment: Patient able to perform sit to stand transfers 2x with independence.    Ambulation/Gait Ambulation/Gait assistance: Independent Gait Distance (Feet): 100 Feet Assistive device: None Gait Pattern/deviations: WFL(Within Functional Limits), Decreased stride length, Narrow base of support, Decreased step length - left, Decreased step length - right, Decreased stance time - right Gait velocity: normal     General Gait Details: Patient was able to ambulate for 100 feet independently. Patient demonstrates minor gait deficits but does not impede ability to ambulate close to [facility].  Stairs            Wheelchair Mobility    Modified Rankin (Stroke Patients Only)       Balance Overall balance assessment: Independent                                           Pertinent Vitals/Pain Pain Assessment Pain Assessment: 0-10 Pain Score: 4  Pain Location: low back Pain Descriptors / Indicators: Dull, Aching Pain Intervention(s): Limited activity within patient's tolerance, Monitored during session, Repositioned    Home Living Family/patient expects to be discharged to:: Private residence Living Arrangements: Spouse/significant other Available Help at Discharge: Available 24 hours/day;Family Type of Home: House Home Access: Stairs to enter Entrance Stairs-Rails: Left Entrance Stairs-Number of Steps: 2   Home Layout:  Two level Home Equipment: Agricultural consultant (2 wheels);Rollator (4 wheels);Cane - single point;Grab bars - tub/shower;Shower seat      Prior Function Prior Level of Function : Independent/Modified Independent             Mobility Comments: Patient reports being able to ambulate independently at home and in the community. Patient is  currently driving. ADLs Comments: Patient reports being independent with ADL's and functional tasks overall.     Hand Dominance   Dominant Hand: Right    Extremity/Trunk Assessment   Upper Extremity Assessment Upper Extremity Assessment: Overall WFL for tasks assessed    Lower Extremity Assessment Lower Extremity Assessment: Overall WFL for tasks assessed    Cervical / Trunk Assessment Cervical / Trunk Assessment: Normal  Communication   Communication: No difficulties  Cognition Arousal/Alertness: Awake/alert Behavior During Therapy: WFL for tasks assessed/performed Overall Cognitive Status: Within Functional Limits for tasks assessed                                          General Comments      Exercises     Assessment/Plan    PT Assessment Patient does not need any further PT services  PT Problem List         PT Treatment Interventions      PT Goals (Current goals can be found in the Care Plan section)  Acute Rehab PT Goals Patient Stated Goal: return home PT Goal Formulation: With patient Time For Goal Achievement: 11/26/21 Potential to Achieve Goals: Good    Frequency       Co-evaluation PT/OT/SLP Co-Evaluation/Treatment: Yes Reason for Co-Treatment: To address functional/ADL transfers PT goals addressed during session: Mobility/safety with mobility;Balance;Strengthening/ROM         AM-PAC PT "6 Clicks" Mobility  Outcome Measure Help needed turning from your back to your side while in a flat bed without using bedrails?: None Help needed moving from lying on your back to sitting on the side of a flat bed without using bedrails?: None Help needed moving to and from a bed to a chair (including a wheelchair)?: None Help needed standing up from a chair using your arms (e.g., wheelchair or bedside chair)?: None Help needed to walk in hospital room?: None Help needed climbing 3-5 steps with a railing? : None 6 Click Score: 24     End of Session   Activity Tolerance: Patient tolerated treatment well;No increased pain Patient left: in chair;with call bell/phone within reach Nurse Communication: Mobility status PT Visit Diagnosis: Unsteadiness on feet (R26.81);Other abnormalities of gait and mobility (R26.89);Muscle weakness (generalized) (M62.81)    Time: 5027-7412 PT Time Calculation (min) (ACUTE ONLY): 15 min   Charges:   PT Evaluation $PT Eval Low Complexity: 1 Low PT Treatments $Therapeutic Activity: 8-22 mins        3:41 PM, 11/26/21 Arther Abbott, S/PT

## 2021-11-26 NOTE — Progress Notes (Signed)
Pharmacy Antibiotic Note  Dalton Porter is a 66 y.o. male admitted on 11/26/2021 with  diabetic foot infection/osteo .  Pharmacy has been consulted for Vancomycin and Zosyn dosing.  Plan: Zosyn 3.375g IV q8h (4 hour infusion). Vancomycin 2000 mg IV x  1 dose. Vancomycin 1500 mg IV every 24 hours. Monitor labs, c/s, and vanco level as indicated  Height: 5\' 11"  (180.3 cm) Weight: 108.9 kg (240 lb) IBW/kg (Calculated) : 75.3  Temp (24hrs), Avg:98.2 F (36.8 C), Min:98.2 F (36.8 C), Max:98.2 F (36.8 C)  Recent Labs  Lab 11/26/21 1042  WBC 8.2  CREATININE 1.23    Estimated Creatinine Clearance: 76.1 mL/min (by C-G formula based on SCr of 1.23 mg/dL).    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  6/30 11/26/2021 1:22 PM

## 2021-11-27 DIAGNOSIS — E1100 Type 2 diabetes mellitus with hyperosmolarity without nonketotic hyperglycemic-hyperosmolar coma (NKHHC): Secondary | ICD-10-CM

## 2021-11-27 DIAGNOSIS — Z8673 Personal history of transient ischemic attack (TIA), and cerebral infarction without residual deficits: Secondary | ICD-10-CM | POA: Diagnosis not present

## 2021-11-27 DIAGNOSIS — E1149 Type 2 diabetes mellitus with other diabetic neurological complication: Secondary | ICD-10-CM | POA: Diagnosis not present

## 2021-11-27 DIAGNOSIS — I10 Essential (primary) hypertension: Secondary | ICD-10-CM | POA: Diagnosis not present

## 2021-11-27 DIAGNOSIS — M86171 Other acute osteomyelitis, right ankle and foot: Secondary | ICD-10-CM | POA: Diagnosis not present

## 2021-11-27 LAB — CREATININE, SERUM
Creatinine, Ser: 0.98 mg/dL (ref 0.61–1.24)
GFR, Estimated: >60 mL/min (ref >60)

## 2021-11-27 LAB — GLUCOSE, CAPILLARY
Glucose-Capillary: 110 mg/dL — ABNORMAL HIGH (ref 70–99)
Glucose-Capillary: 212 mg/dL — ABNORMAL HIGH (ref 70–99)
Glucose-Capillary: 77 mg/dL (ref 70–99)
Glucose-Capillary: 231 mg/dL — ABNORMAL HIGH (ref 70–99)

## 2021-11-27 NOTE — Progress Notes (Signed)
  Transition of Care [facility]) Screening Note   Patient Details  Name: Dalton Porter Date of Birth: Nov 09, 1955   Transition of [facility]) CM/SW Contact:    Ihor Gully, LCSW Phone Number: 11/27/2021, 4:16 PM    Transition of Care Department [facility]) has reviewed patient and no TOC needs have been identified at this time. We will continue to monitor patient advancement through interdisciplinary progression rounds. If new patient transition needs arise, please place a TOC consult.

## 2021-11-27 NOTE — Progress Notes (Signed)
No ICM remote transmission received for 11/26/2021 due to hospitalization and next ICM transmission scheduled for 12/03/2021.

## 2021-11-27 NOTE — Progress Notes (Signed)
[facility] Progress Note     Subjective: Patient seen and examined.  He is resting comfortably in bed.  He confirms a couple small twinges of pain in his foot, but is otherwise doing well.  He denies any fevers or chills.  Objective: Vital signs in last 24 hours: Temp:  [97.5 F (36.4 C)-98.3 F (36.8 C)] 97.8 F (36.6 C) (05/31 0416) Pulse Rate:  [68-74] 71 (05/31 0906) Resp:  [13-20] 13 (05/31 0416) BP: (96-128)/(55-65) 110/58 (05/31 0906) SpO2:  [94 %-100 %] 96 % (05/31 0416) Weight:  [108.5 kg-109.2 kg] 108.5 kg (05/31 0500) Last BM Date : 11/26/21  Intake/Output from previous day: 05/30 0701 - 05/31 0700 In: 775.6 [P.O.:480; IV Piggyback:295.6] Out: -  Intake/Output this shift: No intake/output data recorded.  General appearance: alert, cooperative, and no distress Pulses: 2+ DP and PT pulses bilaterally Foot: Right second toe with improving erythema, no significant swelling, nontender to palpation, no open wounds on toe or foot  Lab Results:  Recent Labs    11/26/21 1042  WBC 8.2  HGB 11.3*  HCT 35.5*  PLT 235   BMET Recent Labs    11/26/21 1042 11/27/21 0506  NA 140  --   K 4.5  --   CL 107  --   CO2 26  --   GLUCOSE 125*  --   BUN 22  --   CREATININE 1.23 0.98  CALCIUM 8.7*  --    PT/INR No results for input(s): LABPROT, INR in the last 72 hours.  Studies/Results: DG Foot Complete Right  Result Date: 11/26/2021 CLINICAL DATA:  Pain, swelling and redness of the right second toe. History of prior foot and ankle surgery. EXAM: RIGHT FOOT COMPLETE - 3+ VIEW COMPARISON:  None Available. FINDINGS: Prior fifth ray resection. Fixation hardware noted on the fourth metatarsal. Moderate tibiotalar, subtalar and midfoot degenerative changes. Focal area of cortical disruption and destructive bony changes involving the medial aspect of the middle phalanx of the second toe worrisome for osteomyelitis. The other bony structures are intact.  IMPRESSION: 1. Focal area of cortical disruption and destructive bony changes involving the medial aspect of the middle phalanx of the second toe worrisome for osteomyelitis. 2. Moderate tibiotalar, subtalar and midfoot degenerative changes. Electronically Signed   By: Rudie Meyer M.D.   On: 11/26/2021 10:15    Anti-infectives: Anti-infectives (From admission, onward)    Start     Dose/Rate Route Frequency Ordered Stop   11/27/21 1400  vancomycin (VANCOREADY) IVPB 1500 mg/300 mL        1,500 mg 150 mL/hr over 120 Minutes Intravenous Every 24 hours 11/26/21 1319     11/26/21 2000  piperacillin-tazobactam (ZOSYN) IVPB 3.375 g        3.375 g 12.5 mL/hr over 240 Minutes Intravenous Every 8 hours 11/26/21 1302     11/26/21 1300  vancomycin (VANCOCIN) IVPB 1000 mg/200 mL premix        1,000 mg 200 mL/hr over 60 Minutes Intravenous  Once 11/26/21 1233 11/26/21 1433   11/26/21 1300  piperacillin-tazobactam (ZOSYN) IVPB 3.375 g        3.375 g 100 mL/hr over 30 Minutes Intravenous  Once 11/26/21 1251 11/26/21 1406   11/26/21 1100  vancomycin (VANCOCIN) IVPB 1000 mg/200 mL premix        1,000 mg 200 mL/hr over 60 Minutes Intravenous  Once 11/26/21 1047 11/26/21 1303   11/26/21 1100  cefTRIAXone (ROCEPHIN) 1 g in sodium chloride 0.9 % 100  mL IVPB        1 g 200 mL/hr over 30 Minutes Intravenous  Once 11/26/21 1047 11/26/21 1201   11/26/21 1000  doxycycline (VIBRA-TABS) tablet 100 mg        100 mg Oral  Once 11/26/21 0957 11/26/21 1014       Assessment/Plan:  Patient is a 66 year old male who presents with erythema of his right second toe.  Foot x-ray concerning for osteomyelitis of the medial second middle phalanx.  -No acute surgical debridement necessary at this time given no associated abscess or wound -Continue IV antibiotics at this time -Will plan to discharge the patient home with oral antibiotics, likely Keflex and doxycycline for 2 weeks -[facility], they will be calling him back with an appointment -Patient is aware that we are attempting conservative management at this time, but he may require debridement, partial amputation, or full amputation of his right second toe depending on how he responds to the antibiotic therapy -Regular diet -As needed pain control and antiemetics -Appreciate hospitalist recommendations -Plan for discharge home tomorrow   LOS: 1 day    Mirna Sutcliffe A Emile Ringgenberg 11/27/2021

## 2021-11-27 NOTE — Progress Notes (Signed)
[facility]                                                                               Bela Sokoloff, is a 66 y.o. male, DOB - 1955/08/12, XTG:[id number] Admit date - 11/26/2021    Outpatient Primary MD for the patient is Wallace Cullens, MD  LOS - 1  days    Brief summary   Patient is a 66 year old male with history of diabetes mellitus type 2, HTN, HLD, diabetic neuropathy, previous toe amputation presented with increased swelling, redness of the right second toe, no drainage.  Symptoms started over a week and has been progressively getting worse.   Assessment & Plan    Assessment and Plan: * Osteomyelitis of the right second toe (HCC) - X-ray of the left foot positive for changes consistent with osteomyelitis of the right second toe -Surgery consulted, recommended IV antibiotics with prolonged course of oral antibiotics upon discharge.  May need amputation of this toe in future if the osteomyelitis fails to improve with the conservative management. - will continue IV vancomycin and Zosyn today, reassess in a.m. -[facility] recommended making an appointment in 2 weeks   DMII (diabetes mellitus, type 2) (HCC) uncontrolled with diabetic foot, osteomyelitis of the right second toe -Continue sliding scale sensitive coverage, glipizide -Hold metformin -Hemoglobin A1c 6.6   HTN (hypertension) -Currently stable, continue metoprolol, Entresto, Aldactone, Lasix  Diabetic neuropathy -Continue gabapentin   Hyperlipidemia -Continue statin  History of stroke with prior left-sided weakness -Continue aspirin, Lipitor   Nonischemic cardiomyopathy (HCC) - Continue metoprolol, aspirin, Lasix, Aldactone, Entresto -Last echo from 2021 reviewed:showed EF 40-45% with global hypokinesis and indeterminate left ventricular diastolic parameters -Stable   Obesity Estimated body mass index is 33.36 kg/m as calculated from the following:   Height as of this  encounter: 5\' 11"  (1.803 m).   Weight as of this encounter: 108.5 kg.  Code Status: Full code DVT Prophylaxis:  heparin injection 5,000 Units Start: 11/26/21 1400 TED hose Start: 11/26/21 1223 SCDs Start: 11/26/21 1223   Level of Care: Level of care: Med-Surg Family Communication: Updated patient  Disposition Plan:     Remains inpatient appropriate: Currently on IV antibiotics for acute osteomyelitis of the right second toe  Procedures:  None  Consultants:   Surgery  Antimicrobials:  IV vancomycin 11/26/2021 -->  IV Zosyn 11/26/2021-->    Medications   atorvastatin  20 mg Oral Daily   cholecalciferol  5,000 Units Oral Daily   furosemide  40 mg Oral Daily   gabapentin  300 mg Oral TID   glipiZIDE  5 mg Oral BID AC   heparin  5,000 Units Subcutaneous Q8H   insulin aspart  0-9 Units Subcutaneous TID WC   metoprolol succinate  25 mg Oral BID   sacubitril-valsartan  1 tablet Oral BID   sodium chloride flush  3 mL Intravenous Q12H   sodium chloride flush  3 mL Intravenous Q12H   spironolactone  12.5 mg Oral Daily   traZODone  100 mg Oral QHS   vitamin B-12  500 mcg Oral Daily     Subjective:   Kayhan Souffront was seen and examined  today.  Pain 2-3/ 10 in right 2nd toe. Patient denies dizziness, chest pain, shortness of breath, abdominal pain, N/V/D/C, new weakness, numbess, tingling. No acute events overnight.    Objective:   Vitals:   11/26/21 1746 11/26/21 2224 11/27/21 0416 11/27/21 0500  BP: (!) 121/59 (!) 96/56 (!) 113/55   Pulse: 69 69 68   Resp: 18 20 13    Temp: (!) 97.5 F (36.4 C) 97.9 F (36.6 C) 97.8 F (36.6 C)   TempSrc: Oral Oral Oral   SpO2: 98% 94% 96%   Weight:    108.5 kg  Height:        Intake/Output Summary (Last 24 hours) at 11/27/2021 0859 Last data filed at 11/26/2021 1700 Gross per 24 hour  Intake 535.55 ml  Output --  Net 535.55 ml   Filed Weights   11/26/21 0927 11/26/21 1354 11/27/21 0500  Weight: 108.9 kg 109.2 kg 108.5 kg      Exam General: Alert and oriented x 3, NAD Cardiovascular: S1 S2 auscultated, no murmurs, RRR Respiratory: Clear to auscultation bilaterally, no wheezing, rales or rhonchi Gastrointestinal: Soft, nontender, nondistended, + bowel sounds Ext: no pedal edema bilaterally Neuro: no acute neurological changes Skin: See below, right foot second toe erythematous, no open wounds or discharge. Psych: Normal affect and demeanor, alert and oriented x3   Right 2nd toe 11/27/21    Data Reviewed:  I have personally reviewed following labs    CBC Lab Results  Component Value Date   WBC 8.2 11/26/2021   RBC 3.85 (L) 11/26/2021   HGB 11.3 (L) 11/26/2021   HCT 35.5 (L) 11/26/2021   MCV 92.2 11/26/2021   MCH 29.4 11/26/2021   PLT 235 11/26/2021   MCHC 31.8 11/26/2021   RDW 13.9 11/26/2021   LYMPHSABS 1.8 05/28/2020   MONOABS 0.7 05/28/2020   EOSABS 0.3 05/28/2020   BASOSABS 0.1 05/28/2020     Last metabolic panel Lab Results  Component Value Date   NA 140 11/26/2021   K 4.5 11/26/2021   CL 107 11/26/2021   CO2 26 11/26/2021   BUN 22 11/26/2021   CREATININE 0.98 11/27/2021   GLUCOSE 125 (H) 11/26/2021   GFRNONAA >60 11/27/2021   GFRAA >60 05/24/2019   CALCIUM 8.7 (L) 11/26/2021   PROT 7.5 05/28/2020   ALBUMIN 3.9 05/28/2020   LABGLOB 3.3 05/30/2020   AGRATIO 0.9 05/30/2020   BILITOT 0.5 05/28/2020   ALKPHOS 48 05/28/2020   AST 22 05/28/2020   ALT 25 05/28/2020   ANIONGAP 7 11/26/2021    CBG (last 3)  Recent Labs    11/26/21 1638 11/26/21 2045 11/27/21 0714  GLUCAP 71 169* 110*      Coagulation Profile: No results for input(s): INR, PROTIME in the last 168 hours.   Radiology Studies: I have personally reviewed the imaging studies  DG Foot Complete Right  Result Date: 11/26/2021 CLINICAL DATA:  Pain, swelling and redness of the right second toe. History of prior foot and ankle surgery. EXAM: RIGHT FOOT COMPLETE - 3+ VIEW COMPARISON:  None Available.  FINDINGS: Prior fifth ray resection. Fixation hardware noted on the fourth metatarsal. Moderate tibiotalar, subtalar and midfoot degenerative changes. Focal area of cortical disruption and destructive bony changes involving the medial aspect of the middle phalanx of the second toe worrisome for osteomyelitis. The other bony structures are intact. IMPRESSION: 1. Focal area of cortical disruption and destructive bony changes involving the medial aspect of the middle phalanx of the second toe worrisome for  osteomyelitis. 2. Moderate tibiotalar, subtalar and midfoot degenerative changes. Electronically Signed   By: Rudie Meyer M.D.   On: 11/26/2021 10:15       Cartina Brousseau M.D. [facility] 11/27/2021, 8:59 AM  Available via Epic secure chat 7am-7pm After 7 pm, please refer to night coverage provider listed on amion.

## 2021-11-28 DIAGNOSIS — M86171 Other acute osteomyelitis, right ankle and foot: Secondary | ICD-10-CM | POA: Diagnosis not present

## 2021-11-28 DIAGNOSIS — E1100 Type 2 diabetes mellitus with hyperosmolarity without nonketotic hyperglycemic-hyperosmolar coma (NKHHC): Secondary | ICD-10-CM | POA: Diagnosis not present

## 2021-11-28 DIAGNOSIS — E1149 Type 2 diabetes mellitus with other diabetic neurological complication: Secondary | ICD-10-CM | POA: Diagnosis not present

## 2021-11-28 LAB — GLUCOSE, CAPILLARY
Glucose-Capillary: 161 mg/dL — ABNORMAL HIGH (ref 70–99)
Glucose-Capillary: 197 mg/dL — ABNORMAL HIGH (ref 70–99)

## 2021-11-28 MED ORDER — DOXYCYCLINE HYCLATE 100 MG PO TABS: 100.0000 mg | ORAL_TABLET | Freq: Two times a day (BID) | ORAL | 0 refills | Status: AC

## 2021-11-28 MED ORDER — CEPHALEXIN 500 MG PO CAPS
500.0000 mg | ORAL_CAPSULE | Freq: Four times a day (QID) | ORAL | Status: DC
  Administered 2021-11-28: 500 mg via ORAL
  Filled 2021-11-28: qty 1

## 2021-11-28 MED ORDER — DOXYCYCLINE HYCLATE 100 MG PO TABS
100.0000 mg | ORAL_TABLET | Freq: Two times a day (BID) | ORAL | Status: DC
  Administered 2021-11-28: 100 mg via ORAL
  Filled 2021-11-28: qty 1

## 2021-11-28 MED ORDER — CEPHALEXIN 500 MG PO CAPS: 500.0000 mg | ORAL_CAPSULE | Freq: Three times a day (TID) | ORAL | Status: DC

## 2021-11-28 MED ORDER — CEPHALEXIN 500 MG PO CAPS: 1000.0000 mg | ORAL_CAPSULE | Freq: Two times a day (BID) | ORAL | Status: DC

## 2021-11-28 MED ORDER — CEPHALEXIN 500 MG PO CAPS: 500.0000 mg | ORAL_CAPSULE | Freq: Four times a day (QID) | ORAL | 0 refills | Status: AC

## 2021-11-28 MED ORDER — DOXYCYCLINE HYCLATE 100 MG PO TABS: 100.0000 mg | ORAL_TABLET | Freq: Two times a day (BID) | ORAL | Status: DC

## 2021-11-28 NOTE — Progress Notes (Signed)
[facility] Progress Note     Subjective: Patient seen and examined.  He is resting comfortably in bed.  He denies any pain associated with his right foot, but states that his feeling is limited.  The erythema has significantly improved.  He denies any fevers or chills.  He has no complaints at this time.  Objective: Vital signs in last 24 hours: Temp:  [97.7 F (36.5 C)-98.1 F (36.7 C)] 97.7 F (36.5 C) (06/01 0457) Pulse Rate:  [69-76] 69 (06/01 0457) Resp:  [14-20] 18 (06/01 0457) BP: (99-113)/(55-64) 113/64 (06/01 0457) SpO2:  [95 %-97 %] 96 % (06/01 0457) Weight:  [106.8 kg] 106.8 kg (06/01 0500) Last BM Date : 11/27/21  Intake/Output from previous day: 05/31 0701 - 06/01 0700 In: 887.5 [P.O.:480; IV Piggyback:407.5] Out: -  Intake/Output this shift: No intake/output data recorded.  General appearance: alert, cooperative, and no distress Pulses: 2+ DP and PT pulses bilaterally Foot: Right second toe with almost completely resolved erythema, no significant swelling, nontender to palpation, no open wounds on telemetry foot  Lab Results:  Recent Labs    11/26/21 1042  WBC 8.2  HGB 11.3*  HCT 35.5*  PLT 235   BMET Recent Labs    11/26/21 1042 11/27/21 0506  NA 140  --   K 4.5  --   CL 107  --   CO2 26  --   GLUCOSE 125*  --   BUN 22  --   CREATININE 1.23 0.98  CALCIUM 8.7*  --    PT/INR No results for input(s): LABPROT, INR in the last 72 hours.  Studies/Results: DG Foot Complete Right  Result Date: 11/26/2021 CLINICAL DATA:  Pain, swelling and redness of the right second toe. History of prior foot and ankle surgery. EXAM: RIGHT FOOT COMPLETE - 3+ VIEW COMPARISON:  None Available. FINDINGS: Prior fifth ray resection. Fixation hardware noted on the fourth metatarsal. Moderate tibiotalar, subtalar and midfoot degenerative changes. Focal area of cortical disruption and destructive bony changes involving the medial aspect of the middle  phalanx of the second toe worrisome for osteomyelitis. The other bony structures are intact. IMPRESSION: 1. Focal area of cortical disruption and destructive bony changes involving the medial aspect of the middle phalanx of the second toe worrisome for osteomyelitis. 2. Moderate tibiotalar, subtalar and midfoot degenerative changes. Electronically Signed   By: Rudie Meyer M.D.   On: 11/26/2021 10:15    Anti-infectives: Anti-infectives (From admission, onward)    Start     Dose/Rate Route Frequency Ordered Stop   11/28/21 1030  doxycycline (VIBRA-TABS) tablet 100 mg  Status:  Discontinued        100 mg Oral Every 12 hours 11/28/21 0934 11/28/21 0937   11/28/21 1030  cephALEXin (KEFLEX) capsule 500 mg  Status:  Discontinued        500 mg Oral Every 8 hours 11/28/21 0937 11/28/21 0938   11/28/21 1030  doxycycline (VIBRA-TABS) tablet 100 mg        100 mg Oral Every 12 hours 11/28/21 0937     11/28/21 1030  cephALEXin (KEFLEX) capsule 1,000 mg        1,000 mg Oral Every 12 hours 11/28/21 0938     11/27/21 1400  vancomycin (VANCOREADY) IVPB 1500 mg/300 mL  Status:  Discontinued        1,500 mg 150 mL/hr over 120 Minutes Intravenous Every 24 hours 11/26/21 1319 11/28/21 0934   11/26/21 2000  piperacillin-tazobactam (ZOSYN) IVPB 3.375 g  Status:  Discontinued        3.375 g 12.5 mL/hr over 240 Minutes Intravenous Every 8 hours 11/26/21 1302 11/28/21 0937   11/26/21 1300  vancomycin (VANCOCIN) IVPB 1000 mg/200 mL premix        1,000 mg 200 mL/hr over 60 Minutes Intravenous  Once 11/26/21 1233 11/26/21 1433   11/26/21 1300  piperacillin-tazobactam (ZOSYN) IVPB 3.375 g        3.375 g 100 mL/hr over 30 Minutes Intravenous  Once 11/26/21 1251 11/26/21 1406   11/26/21 1100  vancomycin (VANCOCIN) IVPB 1000 mg/200 mL premix        1,000 mg 200 mL/hr over 60 Minutes Intravenous  Once 11/26/21 1047 11/26/21 1303   11/26/21 1100  cefTRIAXone (ROCEPHIN) 1 g in sodium chloride 0.9 % 100 mL IVPB        1  g 200 mL/hr over 30 Minutes Intravenous  Once 11/26/21 1047 11/26/21 1201   11/26/21 1000  doxycycline (VIBRA-TABS) tablet 100 mg        100 mg Oral  Once 11/26/21 0957 11/26/21 1014       Assessment/Plan:  Patient is a 66 year old male who presents with erythema of his right second toe.  Foot x-ray concerning for osteomyelitis of the medial second middle phalanx.  -No acute surgical intervention at this time, as there is no abscess or open wound -Once patient is discharged, he will send his discharge paperwork over to the [place], and they will get him scheduled with podiatry for follow-up -I advised him that he should follow-up with podiatry within 2 weeks of discharge -Patient stable for discharge from general surgery standpoint -Would discharge home with 2 weeks of Keflex and doxycycline for both MRSA and gram-negative coverage -Appreciate hospitalist recommendations   LOS: 2 days    Keyunna Coco A Aleda Madl 11/28/2021

## 2021-11-28 NOTE — Discharge Summary (Signed)
Physician Discharge Summary   Patient: Dalton Porter MRN: [id number] DOB: [birth year]  Admit date:     11/26/2021  Discharge date: 11/28/21  Discharge Physician: Thad Ranger, MD    PCP: Wallace Cullens, MD   Recommendations at discharge:   Continue doxycycline 100 mg twice daily for 2 weeks Continue Keflex 500 mg p.o. every 6 hours for 2 weeks Patient [facility], may need to prolong the antibiotic course  Discharge Diagnoses:    Osteomyelitis (HCC) right second toe   Diabetes mellitus type 2 with neurological manifestations (HCC)   HTN (hypertension)   DMII (diabetes mellitus, type 2) (HCC)   Nonischemic cardiomyopathy (HCC)   History of stroke   Left-sided weakness   Hyperlipidemia  Hospital Course:  Patient is a 66 year old male with history of diabetes mellitus type 2, HTN, HLD, diabetic neuropathy, previous toe amputation presented with increased swelling, redness of the right second toe, no drainage.  Symptoms started over a week and has been progressively getting worse.  Assessment and Plan:   Osteomyelitis of the right second toe (HCC) - X-ray of the left foot positive for changes consistent with osteomyelitis of the right second toe -Surgery was consulted, recommended IV antibiotics with prolonged course of oral antibiotics upon discharge.  May need amputation of this toe in future if the osteomyelitis fails to improve with the conservative management. -Patient was placed on IV vancomycin and Zosyn, transition to oral doxycycline and Keflex for 2 weeks. -[facility], recommended making an appointment in 2 weeks     DMII (diabetes mellitus, type 2) (HCC) uncontrolled with diabetic foot, osteomyelitis of the right second toe -Resume glipizide, metformin. -Hemoglobin A1c 6.6     HTN (hypertension) -Currently stable, continue metoprolol, Entresto, Aldactone, Lasix   Diabetic neuropathy -Continue gabapentin      Hyperlipidemia -Continue statin   History of stroke with prior left-sided weakness -Continue aspirin, Lipitor     Nonischemic cardiomyopathy (HCC) - Continue metoprolol, aspirin, Lasix, Aldactone, Entresto -Last echo from 2021 reviewed:showed EF 40-45% with global hypokinesis and indeterminate left ventricular diastolic parameters -Stable   Obesity Estimated body mass index is 33.36 kg/m as calculated from the following:   Height as of this encounter: 5\' 11"  (1.803 m).   Weight as of this encounter: 108.5 kg.    Pain control - [employer] Controlled Substance Reporting System database was reviewed. and patient was instructed, not to drive, operate heavy machinery, perform activities at heights, swimming or participation in water activities or provide baby-sitting services while on Pain, Sleep and Anxiety Medications; until their outpatient Physician has advised to do so again. Also recommended to not to take more than prescribed Pain, Sleep and Anxiety Medications.  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   atorvastatin 20 MG tablet Commonly known as: LIPITOR Take 20 mg by mouth daily.   cephALEXin 500 MG capsule Commonly known as: KEFLEX Take 1 capsule (500 mg total) by mouth 4 (four) times daily for 14 days.   doxycycline 100 MG tablet Commonly known as: VIBRA-TABS Take  1 tablet (100 mg total) by mouth 2 (two) times daily for 14 days.   Entresto 49-51 MG Generic drug: sacubitril-valsartan Take 1 tablet by mouth 2 (two) times daily.   furosemide 20 MG tablet Commonly known as: LASIX Take 2 tablets (40 mg total) by mouth daily.   gabapentin 300 MG capsule Commonly known as: NEURONTIN Take 300 mg by mouth 3 (three) times daily.   glipiZIDE 5 MG tablet Commonly known as: GLUCOTROL Take 5 mg by mouth 2 (two) times daily before a meal.   metFORMIN 500 MG tablet Commonly known as: GLUCOPHAGE Take 500 mg by mouth 2 (two) times daily with a meal.   metoprolol succinate 25 MG 24 hr tablet Commonly known as: Toprol XL Take 1 tablet (25 mg total) by mouth 2 (two) times daily.   nitroGLYCERIN 0.4 MG SL tablet Commonly known as: NITROSTAT Place 1 tablet (0.4 mg total) under the tongue every 5 (five) minutes as needed for chest pain.   spironolactone 25 MG tablet Commonly known as: ALDACTONE Take 12.5 mg by mouth daily.   TRAZODONE HCL PO Take 100 mg by mouth at bedtime.   vitamin B-12 500 MCG tablet Commonly known as: CYANOCOBALAMIN Take 1 tablet (500 mcg total) by mouth daily.   Vitamin D3 125 MCG (5000 UT) Caps Take 1 capsule by mouth daily.        Follow-up Information     [facility] week(s[facility]   [facility] an [facility] within [facility] weeks of discharge from the hospital               Discharge Exam: Filed Weights   11/26/21 1354 11/27/21 0500 11/28/21 0500  Weight: 109.2 kg 108.5 kg 106.8 kg   S: No acute complaints, no fevers or chills.  Right second toe looking better, no discharge, less erythematous  Vitals:   11/27/21 1337 11/27/21 2014 11/28/21 0457 11/28/21 0500  BP: (!) 102/55 (!) 99/56 113/64   Pulse: 70 76 69   Resp: 20 14 18    Temp: 98.1 F (36.7 C) 98.1 F (36.7 C) 97.7 F (36.5 C)   TempSrc: Oral Oral    SpO2: 97% 95% 96%   Weight:    106.8 kg  Height:        Physical  Exam General: Alert and oriented x 3, NAD Cardiovascular: S1 S2 clear, RRR. No pedal edema b/l Respiratory: CTAB, no wheezing, rales or rhonchi Gastrointestinal: Soft, nontender, nondistended, NBS Ext: no pedal edema bilaterally Neuro: no new deficits Skin: Right second toe redness improving, no open wounds or discharge Psych: Normal affect and demeanor, alert and oriented x3    Condition at discharge: fair  The results of significant diagnostics from this hospitalization (including imaging, microbiology, ancillary and laboratory) are listed below for reference.   Imaging Studies: DG Foot Complete Right  Result Date: 11/26/2021 CLINICAL DATA:  Pain, swelling and redness of the right second toe. History of prior foot and ankle surgery. EXAM: RIGHT FOOT COMPLETE - 3+ VIEW COMPARISON:  None Available. FINDINGS: Prior fifth ray resection. Fixation  hardware noted on the fourth metatarsal. Moderate tibiotalar, subtalar and midfoot degenerative changes. Focal area of cortical disruption and destructive bony changes involving the medial aspect of the middle phalanx of the second toe worrisome for osteomyelitis. The other bony structures are intact. IMPRESSION: 1. Focal area of cortical disruption and destructive bony changes involving the medial aspect of the middle phalanx of the second toe worrisome for osteomyelitis. 2. Moderate tibiotalar, subtalar and midfoot degenerative changes. Electronically Signed   By: Rudie Meyer M.D.   On: 11/26/2021 10:15    Microbiology: Results for orders placed or performed during the hospital encounter of 11/26/21  Culture, blood (Routine X 2) w Reflex to ID Panel     Status: None (Preliminary result)   Collection Time: 11/26/21  1:39 PM   Specimen: Left Antecubital; Blood  Result Value Ref Range Status   Specimen Description LEFT ANTECUBITAL  Final   Special Requests   Final    BOTTLES DRAWN AEROBIC AND ANAEROBIC Blood Culture results may not be optimal due  to an excessive volume of blood received in culture bottles   Culture   Final    NO GROWTH 2 DAYS Performed at [facility], [address] [place], [place] [address]    Report Status PENDING  Incomplete  Culture, blood (Routine X 2) w Reflex to ID Panel     Status: None (Preliminary result)   Collection Time: 11/26/21  1:39 PM   Specimen: BLOOD LEFT HAND  Result Value Ref Range Status   Specimen Description BLOOD LEFT HAND  Final   Special Requests   Final    BOTTLES DRAWN AEROBIC AND ANAEROBIC Blood Culture adequate volume   Culture   Final    NO GROWTH 2 DAYS Performed at [facility], [address], [place], [place] [address]    Report Status PENDING  Incomplete    Labs: CBC: Recent Labs  Lab 11/26/21 1042  WBC 8.2  HGB 11.3*  HCT 35.5*  MCV 92.2  PLT 235   Basic Metabolic Panel: Recent Labs  Lab 11/26/21 1042 11/27/21 0506  NA 140  --   K 4.5  --   CL 107  --   CO2 26  --   GLUCOSE 125*  --   BUN 22  --   CREATININE 1.23 0.98  CALCIUM 8.7*  --    Liver Function Tests: No results for input(s): AST, ALT, ALKPHOS, BILITOT, PROT, ALBUMIN in the last 168 hours. CBG: Recent Labs  Lab 11/27/21 1055 11/27/21 1621 11/27/21 2124 11/28/21 0728 11/28/21 1125  GLUCAP 212* 77 231* 161* 197*    Discharge time spent: greater than 30 minutes.  Signed: Thad Ranger, MD [facility] 11/28/2021

## 2021-12-01 LAB — CULTURE, BLOOD (ROUTINE X 2)
Culture: NO GROWTH
Culture: NO GROWTH
Special Requests: ADEQUATE

## 2021-12-03 ENCOUNTER — Ambulatory Visit (INDEPENDENT_AMBULATORY_CARE_PROVIDER_SITE_OTHER): Payer: No Typology Code available for payment source

## 2021-12-03 DIAGNOSIS — Z95810 Presence of automatic (implantable) cardiac defibrillator: Secondary | ICD-10-CM

## 2021-12-03 DIAGNOSIS — I5022 Chronic systolic (congestive) heart failure: Secondary | ICD-10-CM | POA: Diagnosis not present

## 2021-12-04 ENCOUNTER — Telehealth: Payer: Self-pay

## 2021-12-04 NOTE — Telephone Encounter (Signed)
Remote ICM transmission received.  Attempted call to patient regarding ICM remote transmission and no answer.  

## 2021-12-04 NOTE — Progress Notes (Signed)
EPIC Encounter for ICM Monitoring  Patient Name: Dalton Porter is a 66 y.o. male Date: 12/04/2021 Primary Care Physican: Wallace Cullens, MD Primary Cardiologist: Branch Electrophysiologist: Allred 08/29/2021 Office Weight: 243 lbs     Attempted call to patient and unable to reach.  Transmission reviewed.    [facility] Thoracic impedance fluid levels changed from possible accumulation to possible dryness starting 5/31.   Prescribed: Furosemide 20 mg take 2 tablets (40 mg total) daily. Spironolactone 25 mg take 0.5 tablet (12.5 mg total) daily   Labs: 05/30/2020 Creatinine 1.30, BUN 21, Potassium 4.5, Sodium 136, GFR >60 05/28/2020 Creatinine 1.40, BUN 21, Potassium 4.8, Sodium 139, GFR 59  A complete set of results can be found in Results Review.   Recommendations:  Unable to reach.      Follow-up plan: ICM clinic phone appointment on 12/09/2021 to recheck fluid levels.  91 day device clinic remote transmission 12/09/2021.     EP/Cardiology Office Visits: 01/07/2022 with Dr Wyline Mood.       Copy of ICM check sent to Dr. Johney Frame.  Will send to Dr Wyline Mood for review if patient is reached.   3 month ICM trend: 12/03/2021.    12-14 Month ICM trend:     Karie Soda, RN 12/04/2021 10:12 AM

## 2021-12-09 ENCOUNTER — Ambulatory Visit (INDEPENDENT_AMBULATORY_CARE_PROVIDER_SITE_OTHER): Payer: No Typology Code available for payment source

## 2021-12-09 DIAGNOSIS — I5022 Chronic systolic (congestive) heart failure: Secondary | ICD-10-CM | POA: Diagnosis not present

## 2021-12-11 ENCOUNTER — Ambulatory Visit (INDEPENDENT_AMBULATORY_CARE_PROVIDER_SITE_OTHER): Payer: No Typology Code available for payment source

## 2021-12-11 ENCOUNTER — Telehealth: Payer: Self-pay

## 2021-12-11 DIAGNOSIS — I5022 Chronic systolic (congestive) heart failure: Secondary | ICD-10-CM

## 2021-12-11 DIAGNOSIS — Z95810 Presence of automatic (implantable) cardiac defibrillator: Secondary | ICD-10-CM

## 2021-12-11 LAB — CUP PACEART REMOTE DEVICE CHECK
Battery Remaining Longevity: 80 mo
Battery Remaining Percentage: 70 %
Battery Voltage: 2.99 V
Brady Statistic RV Percent Paced: 11 %
Date Time Interrogation Session: 20230613163928
HighPow Impedance: 75 Ohm
HighPow Impedance: 75 Ohm
Implantable Lead Implant Date: 20190730
Implantable Lead Location: 753860
Implantable Pulse Generator Implant Date: 20190730
Lead Channel Impedance Value: 310 Ohm
Lead Channel Pacing Threshold Amplitude: 1 V
Lead Channel Pacing Threshold Pulse Width: 0.5 ms
Lead Channel Sensing Intrinsic Amplitude: 11.5 mV
Lead Channel Setting Pacing Amplitude: 2.5 V
Lead Channel Setting Pacing Pulse Width: 0.5 ms
Lead Channel Setting Sensing Sensitivity: 0.5 mV
Pulse Gen Serial Number: 9839597

## 2021-12-11 NOTE — Telephone Encounter (Signed)
Remote ICM transmission received.  Attempted call to patient regarding ICM remote transmission and left detailed message per DPR.  Advised to return call for any fluid symptoms or questions. Next ICM remote transmission scheduled 12/17/2021.

## 2021-12-11 NOTE — Progress Notes (Signed)
EPIC Encounter for ICM Monitoring  Patient Name: Dalton Porter is a 66 y.o. male Date: 12/11/2021 Primary Care Physican: Wallace Cullens, MD Primary Cardiologist: Branch Electrophysiologist: Allred 08/29/2021 Office Weight: 243 lbs     Attempted call to patient and unable to reach.  Transmission reviewed.    Corvue Thoracic impedance fluid levels returned to possible accumulation starting 6/9.  Fluid levels fluctuating between dryness and accumulation.   Prescribed: Furosemide 20 mg take 2 tablets (40 mg total) daily. Spironolactone 25 mg take 0.5 tablet (12.5 mg total) daily   Labs: 05/30/2020 Creatinine 1.30, BUN 21, Potassium 4.5, Sodium 136, GFR >60 05/28/2020 Creatinine 1.40, BUN 21, Potassium 4.8, Sodium 139, GFR 59  A complete set of results can be found in Results Review.   Recommendations:  Unable to reach.      Follow-up plan: ICM clinic phone appointment on 12/17/2021 to recheck fluid levels.  91 day device clinic remote transmission 03/10/2022.     EP/Cardiology Office Visits: 01/07/2022 with Dr Wyline Mood.       Copy of ICM check sent to Dr. Johney Frame.  Will send to Dr Wyline Mood for review if patient is reached.   3 month ICM trend: 12/10/2021.    12-14 Month ICM trend:     Karie Soda, RN 12/11/2021 4:16 PM

## 2021-12-17 ENCOUNTER — Ambulatory Visit (INDEPENDENT_AMBULATORY_CARE_PROVIDER_SITE_OTHER): Payer: No Typology Code available for payment source

## 2021-12-17 DIAGNOSIS — I5022 Chronic systolic (congestive) heart failure: Secondary | ICD-10-CM

## 2021-12-17 DIAGNOSIS — Z95810 Presence of automatic (implantable) cardiac defibrillator: Secondary | ICD-10-CM

## 2021-12-17 NOTE — Progress Notes (Signed)
EPIC Encounter for ICM Monitoring  Patient Name: Dalton Porter is a 66 y.o. male Date: 12/17/2021 Primary Care Physican: Wallace Cullens, MD Primary Cardiologist: Branch Electrophysiologist: Allred 08/29/2021 Office Weight: 243 lbs     Transmission reviewed.    Corvue Thoracic impedance fluid levels returned close to normal.  Impedance shows ongoing pattern of frequent fluctuations between dryness and accumulation.   Prescribed: Furosemide 20 mg take 2 tablets (40 mg total) daily. Spironolactone 25 mg take 0.5 tablet (12.5 mg total) daily   Labs: 05/30/2020 Creatinine 1.30, BUN 21, Potassium 4.5, Sodium 136, GFR >60 05/28/2020 Creatinine 1.40, BUN 21, Potassium 4.8, Sodium 139, GFR 59  A complete set of results can be found in Results Review.   Recommendations:  No changes.    Follow-up plan: ICM clinic phone appointment on 01/06/2022.  91 day device clinic remote transmission 03/10/2022.     EP/Cardiology Office Visits: 01/07/2022 with Dr Wyline Mood.       Copy of ICM check sent to Dr. Johney Frame.     3 month ICM trend: 12/17/2021.    12-14 Month ICM trend:     Karie Soda, RN 12/17/2021 2:04 PM

## 2021-12-24 NOTE — Progress Notes (Signed)
Remote ICD transmission.   

## 2022-01-06 ENCOUNTER — Ambulatory Visit (INDEPENDENT_AMBULATORY_CARE_PROVIDER_SITE_OTHER): Payer: No Typology Code available for payment source

## 2022-01-06 DIAGNOSIS — I5022 Chronic systolic (congestive) heart failure: Secondary | ICD-10-CM

## 2022-01-06 DIAGNOSIS — Z95810 Presence of automatic (implantable) cardiac defibrillator: Secondary | ICD-10-CM | POA: Diagnosis not present

## 2022-01-06 NOTE — Progress Notes (Signed)
EPIC Encounter for ICM Monitoring  Patient Name: Dalton Porter is a 66 y.o. male Date: 01/06/2022 Primary Care Physican: Wallace Cullens, MD Primary Cardiologist: Branch Electrophysiologist: Allred 08/29/2021 Office Weight: 243 lbs     Transmission reviewed.    Corvue Thoracic impedance suggesting possible fluid accumulation from 7/1-7/7 followed by suggested dryness starting 7/8.  Impedance shows ongoing pattern of frequent fluctuations between dryness and accumulation.   Prescribed: Furosemide 20 mg take 2 tablets (40 mg total) daily. Spironolactone 25 mg take 0.5 tablet (12.5 mg total) daily   Labs: 05/30/2020 Creatinine 1.30, BUN 21, Potassium 4.5, Sodium 136, GFR >60 05/28/2020 Creatinine 1.40, BUN 21, Potassium 4.8, Sodium 139, GFR 59  A complete set of results can be found in Results Review.   Recommendations:  No changes.    Follow-up plan: ICM clinic phone appointment on 01/13/2022 to recheck fluid levels.  91 day device clinic remote transmission 03/10/2022.     EP/Cardiology Office Visits: 01/07/2022 with Dr Wyline Mood.       Copy of ICM check sent to Dr. Johney Frame and Dr Wyline Mood as Lorain Childes for 7/11 OV.      3 month ICM trend: 01/06/2022.    12-14 Month ICM trend:     Karie Soda, RN 01/06/2022 7:45 AM

## 2022-01-07 ENCOUNTER — Ambulatory Visit: Payer: No Typology Code available for payment source | Admitting: Cardiology

## 2022-01-13 ENCOUNTER — Ambulatory Visit (INDEPENDENT_AMBULATORY_CARE_PROVIDER_SITE_OTHER): Payer: No Typology Code available for payment source

## 2022-01-13 DIAGNOSIS — Z95810 Presence of automatic (implantable) cardiac defibrillator: Secondary | ICD-10-CM

## 2022-01-13 DIAGNOSIS — I5022 Chronic systolic (congestive) heart failure: Secondary | ICD-10-CM

## 2022-01-15 ENCOUNTER — Telehealth: Payer: Self-pay

## 2022-01-15 NOTE — Telephone Encounter (Signed)
Remote ICM transmission received.  Attempted call to patient regarding ICM remote transmission and left detailed message per DPR.  Advised to return call for any fluid symptoms or questions. Next ICM remote transmission scheduled 02/10/2022.    

## 2022-01-15 NOTE — Progress Notes (Signed)
EPIC Encounter for ICM Monitoring  Patient Name: NEZIAH BRALEY III is a 66 y.o. male Date: 01/15/2022 Primary Care Physican: Wallace Cullens, MD Primary Cardiologist: Branch Electrophysiologist: Allred 08/29/2021 Office Weight: 243 lbs     Attempted call to patient and unable to reach.  Left detailed message per DPR regarding transmission. Transmission reviewed.    Corvue Thoracic impedance suggesting fluid levels returned back to normal.  Impedance was suggesting possible fluid accumulation from 7/1-7/7 followed by suggested dryness starting 7/8.  Impedance shows ongoing pattern of frequent fluctuations between dryness and accumulation.   Prescribed: Furosemide 20 mg take 2 tablets (40 mg total) daily. Spironolactone 25 mg take 0.5 tablet (12.5 mg total) daily   Labs: 05/30/2020 Creatinine 1.30, BUN 21, Potassium 4.5, Sodium 136, GFR >60 05/28/2020 Creatinine 1.40, BUN 21, Potassium 4.8, Sodium 139, GFR 59  A complete set of results can be found in Results Review.   Recommendations:  Left voice mail with ICM number and encouraged to call if experiencing any fluid symptoms.    Follow-up plan: ICM clinic phone appointment on 02/10/2022.  91 day device clinic remote transmission 03/10/2022.     EP/Cardiology Office Visits: 01/07/2022 with Dr Wyline Mood.       Copy of ICM check sent to Dr. Johney Frame.   3 month ICM trend: 01/13/2022.    12-14 Month ICM trend:     Karie Soda, RN 01/15/2022 3:19 PM

## 2022-01-31 ENCOUNTER — Telehealth: Payer: Self-pay

## 2022-01-31 NOTE — Telephone Encounter (Signed)
Pt transferred to clinic in [facility].

## 2022-02-21 ENCOUNTER — Encounter: Payer: Self-pay | Admitting: Cardiology

## 2022-02-21 ENCOUNTER — Ambulatory Visit (INDEPENDENT_AMBULATORY_CARE_PROVIDER_SITE_OTHER): Payer: No Typology Code available for payment source | Admitting: Cardiology

## 2022-02-21 VITALS — BP 116/58 | HR 75 | Ht 71.0 in | Wt 236.8 lb

## 2022-02-21 DIAGNOSIS — I5022 Chronic systolic (congestive) heart failure: Secondary | ICD-10-CM | POA: Diagnosis not present

## 2022-02-21 DIAGNOSIS — R0789 Other chest pain: Secondary | ICD-10-CM | POA: Diagnosis not present

## 2022-02-21 DIAGNOSIS — I4729 Other ventricular tachycardia: Secondary | ICD-10-CM | POA: Diagnosis not present

## 2022-02-21 NOTE — Progress Notes (Signed)
Clinical Summary Dalton Porter is a 66 y.o.male seen today for follow up of the following medical problems.    1. Chest pain - admit 03/2016 with chest pain.  - cath 03/2016 with normal coronaries.  - 03/2016 echo LVEF 40-45% - cardiac MRI 04/2016 without evidence of infiltrative or inflammatory process. LVEF by MRI 33%, diffuse hypokinesis.    05/2019 coronary CTA: LAD,OM1,OM2 disease not significant by FFR.   -long history of of chest pains, prior cath and CTA as reported above   07/2021 lexiscan at [facility]: then sent only the EKG report, don't have imaging. Will request again.   - no recent symptoms.    2. Chronic systoilc HF - echo 03/2016 LVEF 40-45%, recent cath patent coronaries - medical therapy limited by soft bp's and Wenchebach heart block - from discharge notes concern about possible infiltratrive process. cardiac MRI 04/2016 without evidence of infiltrative or inflammatory process. LVEF by MRI 33%, diffuse hypokinesis.   - AICD followed by EP   - [facility] 30%, grade II diastolic dysfunction - ICD [facility] January 26, [facility] at [facility] in  [place], followed by Dr Johney Frame    04/2020 echo: LVEF 40-45%    - reports [facility] lowered aldactone due to increased potassium.  - compliant with meds. [facility] had started jardiance, caused genital infection and now off    - no SOB/DOE, no LE edema - compliant with meds - upcoming echo at the [place].    3. OSA screen - +snoring, can some have apneic episodes. Occasional daytime somnolence.  - has not been interested in sleep study   -limited interest in sleep study.     4. History of CVA - he is full dose ASA due to prior CVA.    5. NSVT - he reports [facility] had done a device check and detected, ordered a 2 week holter which he wore - holter shows 12 episodes of VT. The report does tell how long the episodes were, only the fasted was 174 bpm and that particular episode was 11 beats.     - no recent symptoms -      SH: works at  [employer], goes in around [employer] for morning shift.  Past Medical History:  Diagnosis Date   Back pain, chronic    CHF (congestive heart failure) (HCC)    Diabetes mellitus    Nonischemic cardiomyopathy (HCC)    a. EF 40% by echo in 2003 b. 03/2016: cath showing normal cors and EF of 25-35%, EF 40-45% by echo with diffuse hypokinesis.   Stroke (HCC)      Allergies  Allergen Reactions   Peanuts [Nuts] Anaphylaxis   Jardiance [Empagliflozin] Other (See Comments)    genital skin infection    Meat Extract     Alpha gal - different types of mammal meat - causes anything from SOB to anaphylaxis    Codeine Rash    headache     Current Outpatient Medications  Medication Sig Dispense Refill   aspirin 325 MG tablet Take 325 mg by mouth daily.     atorvastatin (LIPITOR) 20 MG tablet Take 20 mg by mouth daily.     Cholecalciferol (VITAMIN D3) 5000 units CAPS Take 1 capsule by mouth daily.     furosemide (LASIX) 20 MG tablet Take 2 tablets (40 mg total) by mouth daily. 270 tablet 0   gabapentin (NEURONTIN) 300 MG capsule Take 300 mg by mouth 3 (three) times daily.  glipiZIDE (GLUCOTROL) 5 MG tablet Take 5 mg by mouth 2 (two) times daily before a meal.     metFORMIN (GLUCOPHAGE) 500 MG tablet Take 500 mg by mouth 2 (two) times daily with a meal.     metoprolol succinate (TOPROL XL) 25 MG 24 hr tablet Take 1 tablet (25 mg total) by mouth 2 (two) times daily. 45 tablet 0   nitroGLYCERIN (NITROSTAT) 0.4 MG SL tablet Place 1 tablet (0.4 mg total) under the tongue every 5 (five) minutes as needed for chest pain. 25 tablet 3   sacubitril-valsartan (ENTRESTO) 49-51 MG Take 1 tablet by mouth 2 (two) times daily. 60 tablet 11   spironolactone (ALDACTONE) 25 MG tablet Take 12.5 mg by mouth daily.     TRAZODONE HCL PO Take 100 mg by mouth at bedtime.      vitamin B-12 (CYANOCOBALAMIN) 500 MCG tablet Take 1 tablet (500 mcg total) by mouth daily. 30 tablet 1   No current facility-administered  medications for this visit.     Past Surgical History:  Procedure Laterality Date   ANKLE SURGERY     APPENDECTOMY     BACK SURGERY     x3   CARDIAC CATHETERIZATION  2002   CARDIAC CATHETERIZATION N/A 04/08/2016   Procedure: Left Heart Cath and Coronary Angiography;  Surgeon: Peter M Swaziland, MD;  Location: [facility] INVASIVE CV LAB;  Service: Cardiovascular;  Laterality: N/A;   CHOLECYSTECTOMY     ELBOW SURGERY     ICD IMPLANT  01/26/2018   SJM Fortify Assura VR ICD implanted by Dr Marily Memos for primary prevention of sudden death   THUMB FUSION       Allergies  Allergen Reactions   Peanuts [Nuts] Anaphylaxis   Jardiance [Empagliflozin] Other (See Comments)    genital skin infection    Meat Extract     Alpha gal - different types of mammal meat - causes anything from SOB to anaphylaxis    Codeine Rash    headache      Family History  Problem Relation Age of Onset   Stroke Other    Pancreatic cancer Father    Healthy Sister    Other Brother        died in a fire   Leukemia Maternal Grandmother    Healthy Sister      Social History Dalton Porter reports that he has never smoked. He has never used smokeless tobacco. Dalton Porter reports no history of alcohol use.   Review of Systems CONSTITUTIONAL: No weight loss, fever, chills, weakness or fatigue.  HEENT: Eyes: No visual loss, blurred vision, double vision or yellow sclerae.No hearing loss, sneezing, congestion, runny nose or sore throat.  SKIN: No rash or itching.  CARDIOVASCULAR: per hpi RESPIRATORY: No shortness of breath, cough or sputum.  GASTROINTESTINAL: No anorexia, nausea, vomiting or diarrhea. No abdominal pain or blood.  GENITOURINARY: No burning on urination, no polyuria NEUROLOGICAL: No headache, dizziness, syncope, paralysis, ataxia, numbness or tingling in the extremities. No change in bowel or bladder control.  MUSCULOSKELETAL: No muscle, back pain, joint pain or stiffness.  LYMPHATICS: No enlarged nodes. No  history of splenectomy.  PSYCHIATRIC: No history of depression or anxiety.  ENDOCRINOLOGIC: No reports of sweating, cold or heat intolerance. No polyuria or polydipsia.  [name]   Physical Examination Today's Vitals   02/21/22 1010  BP: (!) 116/58  Pulse: 75  SpO2: 99%  Weight: 236 lb 12.8 oz (107.4 kg)  Height: 5\' 11"  (1.803 m)  Body mass index is 33.03 kg/m.  Gen: resting comfortably, no acute distress HEENT: no scleral icterus, pupils equal round and reactive, no palptable cervical adenopathy,  CV: RRR, no m/r/g no jvd Resp: Clear to auscultation bilaterally GI: abdomen is soft, non-tender, non-distended, normal bowel sounds, no hepatosplenomegaly MSK: extremities are warm, no edema.  Skin: warm, no rash Neuro:  no focal deficits Psych: appropriate affect   Diagnostic Studies  03/2016 cath There is severe left ventricular systolic dysfunction. LV end diastolic pressure is mildly elevated. The left ventricular ejection fraction is 25-35% by visual estimate.   1. Normal coronary anatomy 2. Severe LV dysfunction- global 3. Mildly elevated LVEDP   Plan: optimize medical therapy for CHF.   03/2016 echo Study Conclusions   - Left ventricle: The cavity size was normal. There was mild focal   basal hypertrophy of the septum. Systolic function was mildly to   moderately reduced. The estimated ejection fraction was in the   range of 40% to 45%. Diffuse hypokinesis. Doppler parameters are   consistent with abnormal left ventricular relaxation (grade 1   diastolic dysfunction). - Mitral valve: There was mild regurgitation. - Left atrium: The atrium was mildly dilated. - Pulmonary arteries: Systolic pressure was mildly increased.     04/2016 Cardiac MRI IMPRESSION: 1. Moderately dilated left ventricle with normal wall thickness and moderately decreased systolic function (LVEF = 33%) with diffuse hypokinesis.   There is no late gadolinium enhancement.   There is no  evidence for infiltrative or inflammatory myocardial disease. 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   2. Normal right ventricular size, thickness and systolic function (RVEF = 50%) with no wall motion abnormalities.   3.  Moderately dilated left atrium (45 mm).   4. Normal size of the aortic root, ascending aorta and pulmonary artery.   5.  Mild mitral and tricuspid regurgitation.   03/2016 Event monitor Telemetry tracings show primarily sinus rhythm with occasional first degree and second degree Mobitz I Aurora Mask) block, occasional PVCs Reported symptoms correlate with sinus rhtyhm with occasional PVCs     11/2017 [facility] Wenchebach block noted. NSVT up to 19 beats.    12/2017 [facility] LVEF 30%     Assessment and Plan  1. Chronic systolic HF - on optimal medical therapy, genital infection on jardiance now off.  - no symptoms - he reports [facility], will f/u results.    2. Chest pain - fairly chronic symptoms, CTA last year without significant disease - more recent stress test earlier this year at the [place] reportedily benign, requesting results again as they did not send the nuclear imaging portion.    3.NSVT - no symptoms, continue current meds        Antoine Poche, M.D.

## 2022-02-21 NOTE — Patient Instructions (Signed)
Medication Instructions:  Continue all current medications.   Labwork: none  Testing/Procedures: none  Follow-Up: 6 months   Any Other Special Instructions Will Be Listed Below (If Applicable).   If you need a refill on your cardiac medications before your next appointment, please call your pharmacy.  

## 2022-02-25 ENCOUNTER — Encounter: Payer: Self-pay | Admitting: *Deleted

## 2022-03-27 NOTE — Progress Notes (Addendum)
NEUROLOGY FOLLOW UP OFFICE NOTE  Dalton Porter [id number]  Assessment/Plan:   Residual left hemiparesis - possibly secondary to MRI-negative right hemispheric stroke.  Given associated headache, consider migraine but no prior history of migraines and still endorses mild but residual weakness. It seems that neurology in the hospital considered functional etiology as well.  Diabetic polyneuroopathy Type 2 diabetes mellitus Hypertension Hyperlipidemia Nonischemic cardiomyopathy    1  Secondary stroke prevention as managed by PCP/cardiology: - ASA 325mg  daily - Statin.  LDL goal less than 70 - Glycemic control.  Hgb A1c goal less than 7 - Normotensive blood pressure 2  Mediterranean diet 3  Routine exercise 4  Follow up as needed.  Subjective:  Dalton Porter is a 66 year old right-handed male with nonischemic cardiomyopathy, CHF, type 2 diabetes mellitus and chronic back pain who follows up for cerebellar stroke.   UPDATE: Current medications:  ASA 325mg , atorvastatin 20mg , furosemide 40mg , metformin, glipizide, Enestro, gabapentin 300mg  TID   Weakness is not better but not worse.  Still notes tremor which is stable.  Hgb A1c from May 30 was 6.6, down from 7.3 the previous year.     HISTORY: In October 2021.  He describes severe stabbing pain lasting 1-2 minutes followed by persistent dull non-throbbing headache in between a dull persistent non-throbbing headache.  He had a similar headache in June 2021 in which he went to the ED at [facility] where he workup included sed rate 45 and CT head showing small chronic infarct in the right occipital lobe but no acute abnormality.  Otherwise, reports only history of occasional tension or "sinus" headaches.  He presented to [facility] on 05/28/2020 after endorsing left-sided upper and lower extremity weakness and difficulty with getting words out for a week.  CTA of head and neck personally reviewed showed mild atherosclerotic  disease involving the left carotid bifurcation, left V4 vertebral artery and bilateral cavernous carotid arteries but no emergent large vessel occlusion or hemodynamically significant stenosis.  He was transferred to [facility] for AICD to be turned off for MRI.  MRI of brain personally reviewed showed mild chronic small vessel ischemic changes but no acute findings such as stroke.  MRI of cervical spine personally reviewed showed mild degenerative changes with mild foraminal narrowing but overall unremarkable.  He was evaluated by neurology who noted giveaway weakness on the left side.  Echocardiogram showed EF 40-45% with global hypokinesis and indeterminate left ventricular diastolic parameters.  Hgb A1c was 7.3 and LDL 17.  To further evaluate headache, he had an MRV of the head with and without contrast personally reviewed which was negative for dural sinus thrombosis.  Sed rate and CRP were mildly elevated at 67 and 3 respectively but not fitting picture of giant cell arteritis.  He endorses numbness in the feet.  Neuropathy labs, unremarkable, demonstrated negative ANA, SPEP/IFE negative for monoclonal gammopathy, B12 398, methylmalonic acid level 210, negative SSA/SSB antibodies.  CK was 85.  HIV negative.  He was discharged with increased ASA from 81mg  to 325mg  daily.  He underwent outpatient PT/OT/speech therapy which was helpful.  He still endorses some left sided weakness for which he uses a cane.  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     PAST MEDICAL HISTORY: Past Medical History:  Diagnosis Date   Back pain, chronic    CHF (congestive heart failure) (HCC)    Diabetes mellitus  Nonischemic cardiomyopathy ([place])    a. EF 40% by echo in 2003 b. 03/2016: cath showing normal cors and EF of 25-35%, EF 40-45% by echo with diffuse hypokinesis.   Stroke [facility])     MEDICATIONS: Current Outpatient Medications on File Prior to Visit   Medication Sig Dispense Refill   aspirin 325 MG tablet Take 325 mg by mouth daily.     atorvastatin (LIPITOR) 20 MG tablet Take 20 mg by mouth daily.     Cholecalciferol (VITAMIN D3) 5000 units CAPS Take 1 capsule by mouth daily.     furosemide (LASIX) 20 MG tablet Take 2 tablets (40 mg total) by mouth daily. 270 tablet 0   gabapentin (NEURONTIN) 300 MG capsule Take 300 mg by mouth 3 (three) times daily.     glipiZIDE (GLUCOTROL) 5 MG tablet Take 5 mg by mouth 2 (two) times daily before a meal.     metFORMIN (GLUCOPHAGE) 500 MG tablet Take 500 mg by mouth 2 (two) times daily with a meal.     metoprolol succinate (TOPROL XL) 25 MG 24 hr tablet Take 1 tablet (25 mg total) by mouth 2 (two) times daily. 45 tablet 0   nitroGLYCERIN (NITROSTAT) 0.4 MG SL tablet Place 1 tablet (0.4 mg total) under the tongue every 5 (five) minutes as needed for chest pain. 25 tablet 3   sacubitril-valsartan (ENTRESTO) 49-51 MG Take 1 tablet by mouth 2 (two) times daily. 60 tablet 11   spironolactone (ALDACTONE) 25 MG tablet Take 12.5 mg by mouth daily.     TRAZODONE HCL PO Take 100 mg by mouth at bedtime.      vitamin B-12 (CYANOCOBALAMIN) 500 MCG tablet Take 1 tablet (500 mcg total) by mouth daily. 30 tablet 1   No current facility-administered medications on file prior to visit.    ALLERGIES: Allergies  Allergen Reactions   Peanuts [Nuts] Anaphylaxis   Jardiance [Empagliflozin] Other (See Comments)    genital skin infection    Meat Extract     Alpha gal - different types of mammal meat - causes anything from SOB to anaphylaxis    Codeine Rash    headache    FAMILY HISTORY: Family History  Problem Relation Age of Onset   Stroke Other    Pancreatic cancer Father    Healthy Sister    Other Brother        died in a fire   Leukemia Maternal Grandmother    Healthy Sister       Objective:  Blood pressure 117/72, pulse 88, height 5\' 11"  (1.803 m), weight 240 lb (108.9 kg), SpO2 96 %. General: No  acute distress.  Patient appears well-groomed.   Head:  Normocephalic/atraumatic Eyes:  Fundi examined but not visualized Neck: supple, no paraspinal tenderness, full range of motion Heart:  Regular rate and rhythm Neurological Exam: alert and oriented to person, place, and time.  Speech fluent and not dysarthric, language intact.  CN II-XII intact. Bulk and tone normal, muscle strength 5-/5 left deltoid and grip, otherwise 5/5 throughout.  Sensation to pinprick and vibration reduced in feet.  Deep tendon reflexes 1+ throughout, toes downgoing.  Finger to nose testing intact.  Slightly broad-based gait.  Romberg with sway.     Metta Clines, DO  CC: Myrla Halsted, MD

## 2022-03-31 ENCOUNTER — Ambulatory Visit (INDEPENDENT_AMBULATORY_CARE_PROVIDER_SITE_OTHER): Payer: No Typology Code available for payment source | Admitting: Neurology

## 2022-03-31 ENCOUNTER — Encounter: Payer: Self-pay | Admitting: Neurology

## 2022-03-31 VITALS — BP 117/72 | HR 88 | Ht 71.0 in | Wt 240.0 lb

## 2022-03-31 DIAGNOSIS — E1142 Type 2 diabetes mellitus with diabetic polyneuropathy: Secondary | ICD-10-CM | POA: Diagnosis not present

## 2022-03-31 DIAGNOSIS — I10 Essential (primary) hypertension: Secondary | ICD-10-CM | POA: Diagnosis not present

## 2022-03-31 DIAGNOSIS — I69359 Hemiplegia and hemiparesis following cerebral infarction affecting unspecified side: Secondary | ICD-10-CM

## 2022-03-31 DIAGNOSIS — E785 Hyperlipidemia, unspecified: Secondary | ICD-10-CM | POA: Diagnosis not present

## 2022-03-31 DIAGNOSIS — I428 Other cardiomyopathies: Secondary | ICD-10-CM

## 2022-03-31 NOTE — Patient Instructions (Signed)
Continue current medications: Aspirin daily Atorvastatin Continue getting sugars controlled.   Blood pressure controlled Continue daily walks. Follow up as needed

## 2022-07-04 ENCOUNTER — Encounter: Payer: No Typology Code available for payment source | Admitting: Internal Medicine

## 2022-07-17 ENCOUNTER — Encounter: Payer: Self-pay | Admitting: *Deleted

## 2022-07-18 ENCOUNTER — Ambulatory Visit: Payer: No Typology Code available for payment source | Admitting: Cardiovascular Disease

## 2022-07-31 ENCOUNTER — Emergency Department (HOSPITAL_COMMUNITY): Payer: No Typology Code available for payment source

## 2022-07-31 ENCOUNTER — Other Ambulatory Visit: Payer: Self-pay

## 2022-07-31 ENCOUNTER — Emergency Department (HOSPITAL_COMMUNITY)
Admission: EM | Admit: 2022-07-31 | Discharge: 2022-07-31 | Disposition: A | Discharge status: Home / Self Care | Payer: No Typology Code available for payment source | Attending: Emergency Medicine | Admitting: Emergency Medicine

## 2022-07-31 DIAGNOSIS — Z7982 Long term (current) use of aspirin: Secondary | ICD-10-CM | POA: Insufficient documentation

## 2022-07-31 DIAGNOSIS — Z7984 Long term (current) use of oral hypoglycemic drugs: Secondary | ICD-10-CM | POA: Insufficient documentation

## 2022-07-31 DIAGNOSIS — E119 Type 2 diabetes mellitus without complications: Secondary | ICD-10-CM | POA: Insufficient documentation

## 2022-07-31 DIAGNOSIS — Z79899 Other long term (current) drug therapy: Secondary | ICD-10-CM | POA: Diagnosis not present

## 2022-07-31 DIAGNOSIS — I2510 Atherosclerotic heart disease of native coronary artery without angina pectoris: Secondary | ICD-10-CM | POA: Diagnosis not present

## 2022-07-31 DIAGNOSIS — Z91010 Allergy to peanuts: Secondary | ICD-10-CM | POA: Insufficient documentation

## 2022-07-31 DIAGNOSIS — R079 Chest pain, unspecified: Secondary | ICD-10-CM | POA: Insufficient documentation

## 2022-07-31 DIAGNOSIS — I509 Heart failure, unspecified: Secondary | ICD-10-CM | POA: Insufficient documentation

## 2022-07-31 DIAGNOSIS — R7989 Other specified abnormal findings of blood chemistry: Secondary | ICD-10-CM | POA: Insufficient documentation

## 2022-07-31 LAB — BASIC METABOLIC PANEL
Anion gap: 11 (ref 5–15)
BUN: 25 mg/dL — ABNORMAL HIGH (ref 8–23)
CO2: 23 mmol/L (ref 22–32)
Calcium: 9 mg/dL (ref 8.9–10.3)
Chloride: 101 mmol/L (ref 98–111)
Creatinine, Ser: 1.49 mg/dL — ABNORMAL HIGH (ref 0.61–1.24)
GFR, Estimated: 52 mL/min — ABNORMAL LOW (ref >60)
Glucose, Bld: 136 mg/dL — ABNORMAL HIGH (ref 70–99)
Potassium: 4.4 mmol/L (ref 3.5–5.1)
Sodium: 135 mmol/L (ref 135–145)

## 2022-07-31 LAB — CBC
HCT: 37.1 % — ABNORMAL LOW (ref 39.0–52.0)
Hemoglobin: 11.9 g/dL — ABNORMAL LOW (ref 13.0–17.0)
MCH: 29.4 pg (ref 26.0–34.0)
MCHC: 32.1 g/dL (ref 30.0–36.0)
MCV: 91.6 fL (ref 80.0–100.0)
Platelets: 229 10*3/uL (ref 150–400)
RBC: 4.05 MIL/uL — ABNORMAL LOW (ref 4.22–5.81)
RDW: 14.1 % (ref 11.5–15.5)
WBC: 7.8 10*3/uL (ref 4.0–10.5)
nRBC: 0 % (ref 0.0–0.2)

## 2022-07-31 LAB — TROPONIN I (HIGH SENSITIVITY)
Troponin I (High Sensitivity): 14 ng/L (ref <18)
Troponin I (High Sensitivity): 13 ng/L (ref <18)

## 2022-07-31 MED ORDER — SODIUM CHLORIDE 0.9 % IV BOLUS
500.0000 mL | Freq: Once | INTRAVENOUS | Status: AC
  Administered 2022-07-31: 500 mL via INTRAVENOUS

## 2022-07-31 NOTE — Discharge Instructions (Signed)
If you develop recurrent, continued, or worsening chest pain, shortness of breath, fever, vomiting, abdominal or back pain, or any other new/concerning symptoms then return to the ER for evaluation.  

## 2022-07-31 NOTE — ED Provider Notes (Signed)
[facility] Provider Note   CSN: [id number] Arrival date & time: 07/31/22  0750     History  Chief Complaint  Patient presents with   Chest Pain    Dalton Porter is a 67 y.o. male.  HPI 67 year old male with a history of diabetes, CAD, cardiomyopathy, CHF presents with chest pain.  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  Feels like a heaviness to his left chest.  Seem to get better when he rested but then when he walked from the EMS stretcher to the ED stretcher he noticed recurrent chest pain and now has a milder but still present 2 out of 10 pressure.  Pain does not radiate though he does feel little bit of discomfort in his back in the same area.  However does not radiating to his back.  There is no abdominal pain.  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  Some nausea.  States it feels like when he had a history of MI several years ago though not nearly as severe. No pleuritic chest pain.  On and off over the past couple days he has noticed some fluttering in his chest but no pain up until today.  Home Medications Prior to Admission medications   Medication Sig Start Date End Date Taking? Authorizing Provider  atorvastatin (LIPITOR) 20 MG tablet Take 20 mg by mouth daily.   Yes [provider]  Cholecalciferol (VITAMIN D3) 5000 units CAPS Take 1 capsule by mouth daily.   Yes [provider]  furosemide (LASIX) 20 MG tablet Take 2 tablets (40 mg total) by mouth daily. 06/21/19  Yes Strader, [place] M, PA-C  glipiZIDE (GLUCOTROL) 5 MG tablet Take 5 mg by mouth 2 (two) times daily before a meal.   Yes [provider]  metFORMIN (GLUCOPHAGE) 500 MG tablet Take 500 mg by mouth 2 (two) times daily with a meal.   Yes [provider]  metoprolol succinate (TOPROL XL) 25 MG 24 hr tablet Take 1 tablet (25 mg total) by mouth 2 (two) times daily. 04/13/19  Yes  Strader, [place], PA-C  sacubitril-valsartan (ENTRESTO) 97-103 MG TAKE 1 TABLET BY MOUTH TWICE A DAY IF TOP NUMBER ON BLOOD PRESSURE UNDER 90, DO NOT TAKE MEDICATION. 01/31/22  Yes [provider]  spironolactone (ALDACTONE) 25 MG tablet Take 12.5 mg by mouth daily.   Yes [provider]  TRAZODONE HCL PO Take 100 mg by mouth at bedtime.    Yes [provider]  vitamin B-12 (CYANOCOBALAMIN) 500 MCG tablet Take 1 tablet (500 mcg total) by mouth daily. 06/01/20  Yes Hosie Poisson, MD  aspirin 325 MG tablet Take 325 mg by mouth daily. Patient not taking: Reported on 07/31/2022    [provider]  gabapentin (NEURONTIN) 300 MG capsule Take 300 mg by mouth 3 (three) times daily.    [provider]  nitroGLYCERIN (NITROSTAT) 0.4 MG SL tablet Place 1 tablet (0.4 mg total) under the tongue every 5 (five) minutes as needed for chest pain. 04/13/19   Strader, Fransisco Hertz, PA-C      Allergies    Peanuts [nuts], Jardiance [empagliflozin], Meat extract, and Codeine    Review of Systems   Review of Systems  Cardiovascular:  Positive for chest pain. Negative for leg swelling.  Gastrointestinal:  Positive for nausea. Negative for abdominal pain.  Musculoskeletal:  Positive for back  pain.    Physical Exam Updated Vital Signs BP 120/68   Pulse 73   Temp 98.2 F (36.8 C) (Oral)   Resp 14   Ht 5\' 11"  (1.803 m)   Wt 108.9 kg   SpO2 99%   BMI 33.47 kg/m  Physical Exam Vitals and nursing note reviewed.  Constitutional:      General: He is not in acute distress.    Appearance: He is well-developed. He is not ill-appearing or diaphoretic.  HENT:     Head: Normocephalic and atraumatic.  Cardiovascular:     Rate and Rhythm: Normal rate and regular rhythm.     Heart sounds: Normal heart sounds.  Pulmonary:     Effort: Pulmonary effort is normal.     Breath sounds: Normal breath sounds.  Chest:     Chest wall: No tenderness.  Abdominal:     Palpations:  Abdomen is soft.     Tenderness: There is no abdominal tenderness.  Musculoskeletal:     Right lower leg: No edema.     Left lower leg: No edema.  Skin:    General: Skin is warm and dry.  Neurological:     Mental Status: He is alert.     ED Results / Procedures / Treatments   Labs (all labs ordered are listed, but only abnormal results are displayed) Labs Reviewed  BASIC METABOLIC PANEL - Abnormal; Notable for the following components:      Result Value   Glucose, Bld 136 (*)    BUN 25 (*)    Creatinine, Ser 1.49 (*)    GFR, Estimated 52 (*)    All other components within normal limits  CBC - Abnormal; Notable for the following components:   RBC 4.05 (*)    Hemoglobin 11.9 (*)    HCT 37.1 (*)    All other components within normal limits  TROPONIN I (HIGH SENSITIVITY)  TROPONIN I (HIGH SENSITIVITY)    EKG EKG Interpretation  Date/Time:  Thursday July 31 2022 08:00:25 EST Ventricular Rate:  77 PR Interval:  234 QRS Duration: 99 QT Interval:  384 QTC Calculation: 435 R Axis:   -2 Text Interpretation: Sinus rhythm Sinus pause Prolonged PR interval Low voltage, precordial leads Borderline T abnormalities, lateral leads no significant change since Oct 2022 Confirmed by Sherwood Gambler [phone]) on 07/31/2022 8:02:42 AM  Radiology DG Chest Port 1 View  Result Date: 07/31/2022 CLINICAL DATA:  LEFT side chest pain that feels like it is going through his back, heavy pressure EXAM: PORTABLE CHEST 1 VIEW COMPARISON:  Portable exam 0821 hours compared to 06/04/2011 FINDINGS: LEFT subclavian ICD with lead projecting over RIGHT ventricle. Enlargement of cardiac silhouette. Mediastinal contours and pulmonary vascularity normal. Lungs clear. No pulmonary infiltrate, pleural effusion, or pneumothorax. IMPRESSION: Enlargement of cardiac silhouette post ICD. No acute abnormalities. Electronically Signed   By: Lavonia Dana M.D.   On: 07/31/2022 08:27    Procedures Procedures     Medications Ordered in ED Medications  sodium chloride 0.9 % bolus 500 mL (0 mLs Intravenous Stopped 07/31/22 1204)    ED Course/ Medical Decision Making/ A&P                             Medical Decision Making Amount and/or Complexity of Data Reviewed Labs: ordered.    Details: Troponins negative x 2.  Mildly bumped creatinine though he has a waxing and waning creatinine according to chart review.  Radiology: ordered and independent interpretation performed.    Details: No CHF ECG/medicine tests: independent interpretation performed.    Details: Nonspecific T waves but no acute change from baseline   Patient presents with chest pain that started at work.  He has a previous history of CAD.  Otherwise his symptoms have essentially resolved by the time he got here.  EKG overall unremarkable.  Troponins negative x 2.  Doubt dissection or PE.  Discussed options with patient including overnight observation versus close outpatient follow-up with cardiology.  He prefers to go home which I think is reasonable.  He has already taken aspirin this morning.  Discussed return precautions but will otherwise discharge and give urgent cards follow-up.        Final Clinical Impression(s) / ED Diagnoses Final diagnoses:  Nonspecific chest pain    Rx / DC Orders ED Discharge Orders          Ordered    Ambulatory referral to Cardiology       Comments: If you have not heard from the Cardiology office within the next 72 hours please call [phone].   07/31/22 [facility], MD 07/31/22 1524

## 2022-07-31 NOTE — ED Notes (Signed)
Called lab to check on results  

## 2022-07-31 NOTE — ED Triage Notes (Signed)
Pt c/o chest pain that started at work. Left sided chest a pain that feels like it is going through his back and heavy pressure. Pt hx of 2 MI.

## 2022-07-31 NOTE — ED Notes (Signed)
EDP at bedside during triage

## 2022-08-22 ENCOUNTER — Ambulatory Visit: Payer: No Typology Code available for payment source | Admitting: Cardiology

## 2022-09-06 IMAGING — CT CT ANGIO NECK
1 of 10 series · 8 of 34 positions shown · IV contrast (Omnipaque or Isovue)
Comparison: CTA August 27, 2018

CLINICAL DATA: Dizziness.  Headache.

EXAM:
CT ANGIOGRAPHY HEAD AND NECK
TECHNIQUE: Multidetector CT imaging of the head and neck was performed using
the standard protocol during bolus administration of intravenous
contrast. Multiplanar CT image reconstructions and MIPs were
obtained to evaluate the vascular anatomy. Carotid stenosis
measurements (when applicable) are obtained utilizing NASCET
criteria, using the distal internal carotid diameter as the
denominator.
CONTRAST:  100mL OMNIPAQUE IOHEXOL 350 MG/ML SOLN

[Series 9: cta head & neck · axial · 0.50mm/px · z∈[-76,+222]mm · 8 of 769 slices shown]
[im 86/769  soft-tissue]
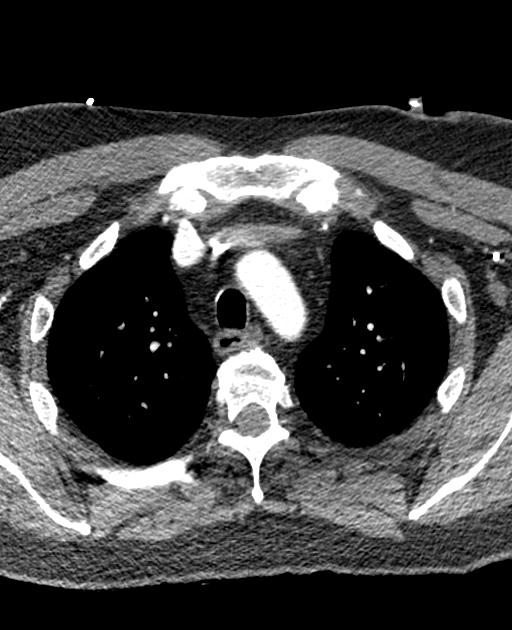
[im 171/769  bone]
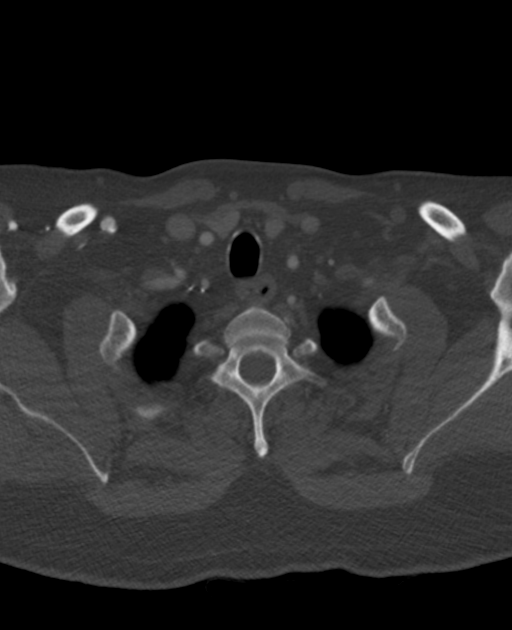
[im 257/769  soft-tissue]
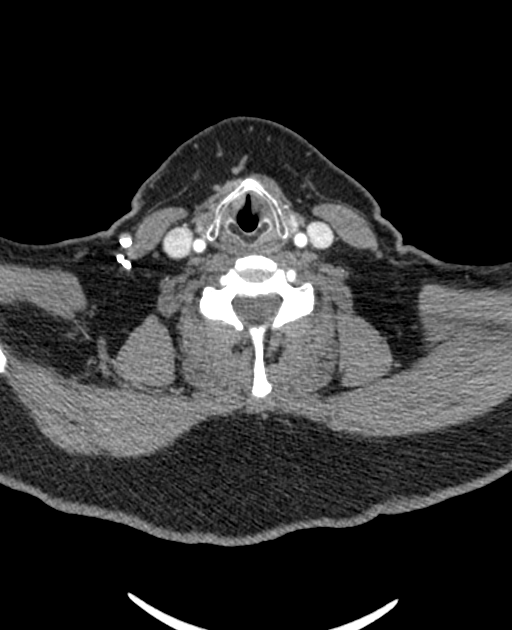
[im 342/769  bone]
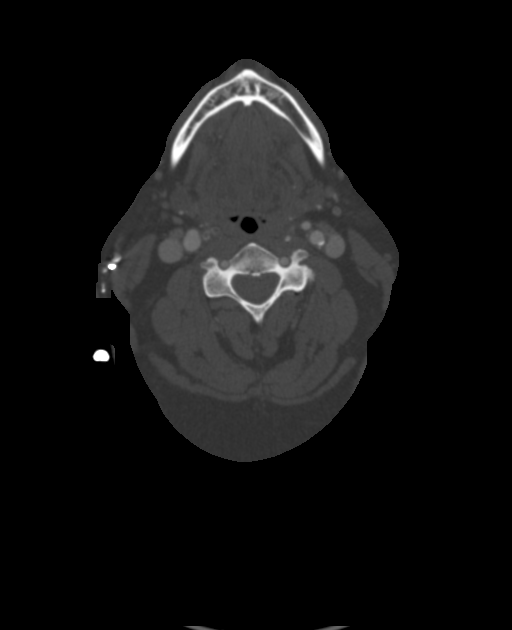
[im 427/769  soft-tissue]
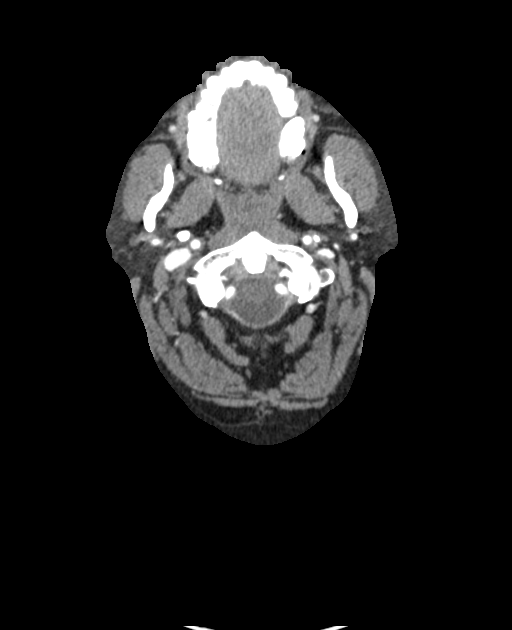
[im 513/769  bone]
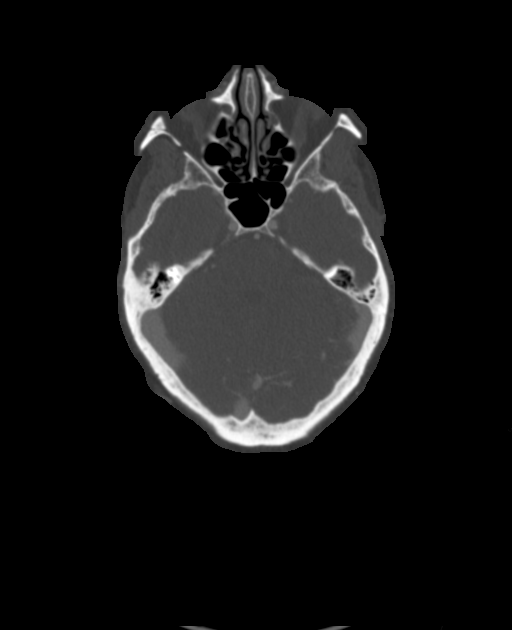
[im 598/769  soft-tissue]
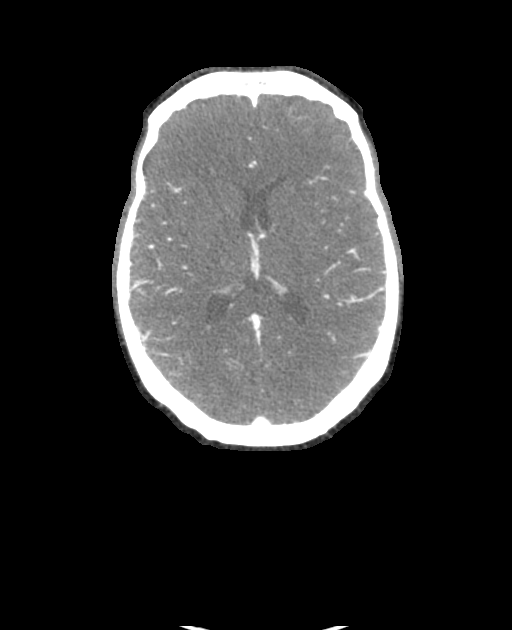
[im 683/769  bone]
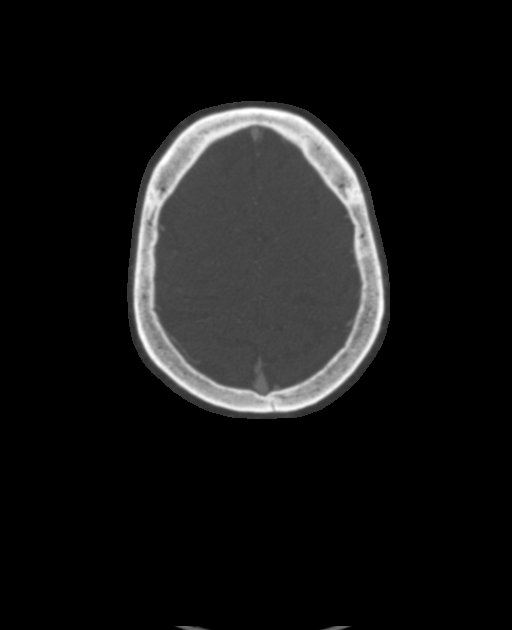

[8 of 34 positions shown; findings below may reference images not displayed]

FINDINGS: CT HEAD FINDINGS

Brain: No evidence of acute large vascular territory infarction,
hemorrhage, hydrocephalus, extra-axial collection or mass
lesion/mass effect.

Skull: No acute fracture.

Sinuses: No acute finding.

Orbits: No acute finding.

Review of the MIP images confirms the above findings

CTA NECK FINDINGS

Aortic arch: Imaged portion shows no evidence of aneurysm or
dissection. No significant stenosis of the major arch vessel
origins.

Right carotid system: No evidence of dissection, stenosis (50% or
greater) or occlusion.

Left carotid system: No evidence of dissection, stenosis (50% or
greater) or occlusion. Mixed atherosclerosis at the bifurcation.

Vertebral arteries: Codominant. No evidence of dissection, stenosis
(50% or greater) or occlusion. Calcific atherosclerosis of the
intradural left vertebral artery with mild associated narrowing.

Skeleton: No evidence of acute fracture.

Other neck: No mass or suspicious adenopathy.

Upper chest: Negative.

Review of the MIP images confirms the above findings

CTA HEAD FINDINGS

Anterior circulation: No significant stenosis, proximal occlusion,
aneurysm, or vascular malformation. Calcific atherosclerosis of
bilateral cavernous carotid arteries.

Posterior circulation: No significant stenosis, proximal occlusion,
aneurysm, or vascular malformation.

Venous sinuses: As permitted by contrast timing, patent.

Review of the MIP images confirms the above findings
IMPRESSION: 1. No evidence of acute intracranial abnormality.
2. No evidence of acute dissection, hemodynamically significant
proximal stenosis, or large vessel occlusion in the head or neck.
3. Mild atherosclerotic disease involving the left carotid
bifurcation, left V4 vertebral artery, and bilateral cavernous
carotid arteries.

## 2022-09-09 ENCOUNTER — Ambulatory Visit: Payer: No Typology Code available for payment source | Admitting: Nurse Practitioner

## 2022-09-23 ENCOUNTER — Encounter: Payer: Self-pay | Admitting: Nurse Practitioner

## 2022-09-23 ENCOUNTER — Ambulatory Visit: Payer: No Typology Code available for payment source | Attending: Cardiology | Admitting: Nurse Practitioner

## 2022-09-23 VITALS — BP 110/68 | HR 87 | Ht 70.0 in | Wt 240.5 lb

## 2022-09-23 DIAGNOSIS — R002 Palpitations: Secondary | ICD-10-CM

## 2022-09-23 DIAGNOSIS — I5022 Chronic systolic (congestive) heart failure: Secondary | ICD-10-CM

## 2022-09-23 DIAGNOSIS — E785 Hyperlipidemia, unspecified: Secondary | ICD-10-CM

## 2022-09-23 DIAGNOSIS — I428 Other cardiomyopathies: Secondary | ICD-10-CM

## 2022-09-23 DIAGNOSIS — Z95810 Presence of automatic (implantable) cardiac defibrillator: Secondary | ICD-10-CM

## 2022-09-23 DIAGNOSIS — I441 Atrioventricular block, second degree: Secondary | ICD-10-CM

## 2022-09-23 NOTE — Patient Instructions (Signed)
Medication Instructions:  Your physician recommends that you continue on your current medications as directed. Please refer to the Current Medication list given to you today.   Labwork: none  Testing/Procedures: none  Follow-Up:  Your physician recommends that you schedule a follow-up appointment in: 3-4 months  Any Other Special Instructions Will Be Listed Below (If Applicable).  You have been referred to Electrophysiology  Please have monitor results faxed to our office @ [phone].   If you need a refill on your cardiac medications before your next appointment, please call your pharmacy.

## 2022-09-23 NOTE — Progress Notes (Signed)
Office Visit    Patient Name: Dalton Porter Date of Encounter: 09/23/2022  PCP:  [facility]  Cardiologist:  Carlyle Dolly, MD  Advanced Practice Provider:  Finis Bud, NP Electrophysiologist:  None   Chief Complaint    Dalton Porter is a 67 y.o. male with a hx of HFrecEF/NICM, prior stroke, NSVT, history of chest pain, type 2 diabetes, hyperlipidemia, Wenkebach block, history of ICD placement in 2019, who presents today for regular follow-up.  Past Medical History    Past Medical History:  Diagnosis Date   Back pain, chronic    CHF (congestive heart failure) ([place])    Diabetes mellitus    Hyperlipidemia 11/26/2021   Nonischemic cardiomyopathy ([place])    a. EF 40% by echo in 2003 b. 03/2016: cath showing normal cors and EF of 25-35%, EF 40-45% by echo with diffuse hypokinesis.   Stroke [facility])    Past Surgical History:  Procedure Laterality Date   ANKLE SURGERY     APPENDECTOMY     BACK SURGERY     x3   CARDIAC CATHETERIZATION  2002   CARDIAC CATHETERIZATION N/A 04/08/2016   Procedure: Left Heart Cath and Coronary Angiography;  Surgeon: Peter M Martinique, MD;  Location: [facility];  Service: Cardiovascular;  Laterality: N/A;   CHOLECYSTECTOMY     ELBOW SURGERY     ICD IMPLANT  01/26/2018   SJM Fortify Assura VR ICD implanted by Dr [name] for primary prevention of sudden death   THUMB FUSION      Allergies  Allergies  Allergen Reactions   Peanuts [Nuts] Anaphylaxis   Jardiance [Empagliflozin] Other (See Comments)    genital skin infection    Meat Extract     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 male with a PMH as mentioned above.  Previous cardiovascular history includes normal cardiac catheterization 2017.  TTE revealed EF mildly reduced at 40 to  45%.  See MRI revealed no evidence of infiltrative/inflammatory process.  LVEF by MRI 33%, diffuse hypokinesis. CCTA 05/2019 revealed coronary calcium score 198, 76 percentile for his demographic, moderate nonobstructive calcified plaque in proximal RCA and distal LAD.  CT FFR analysis did show stenosis in distal LAD, 0.78, and distal OM and OM 2 (0.75, and 0.72 respectively).  GDMT recommended. NST in early 2023 at [facility] was reportedly benign, no imaging was available.   Last seen by Dr. Carlyle Dolly on February 21, 2022.  Was unable to tolerate SGLT2 inhibitor-past history of genital infection.  Was overall doing well from a cardiac perspective.  Was on optimal medical therapy.  He reported having an upcoming echocardiogram arranged with the [place]. TTE 03/2022 showed EF returned to normal - see below.  Today he presents for follow-up. Also followed closely by [facility] in [place], [place]. He states he had recent episode of "butterfly in chest" sensation. Sated he [facility] and had NST performed that "was fine." Denies any recent chest pain, shortness of breath, sustained palpitations, syncope, presyncope, dizziness, orthopnea, PND, swelling or significant weight changes, acute bleeding, or claudication.  SH: Patient was in the [employer] for around 10 years. Wife is a former Quarry manager  at [employer].   EKGs/Labs/Other Studies Reviewed:   The following studies were reviewed today:   EKG:  EKG is not ordered today.    Cardiac monitor ([facility]) 07/2022: Results pending  NST 07/2022 ([facility]): 1. There is no evidence of ischemia. 2. Dilated left ventricular cavity. 3. Mild to moderate global hypokinesis. 4.  Left ventricular ejection fraction 32 percent.   TTE 03/2022 [facility]): Normal left ventricular systolic function. LV diastolic dysfunction of mild degree is present. Mildly increased right sided cardiac  dimensions. Mild pulmonary hypertension. Mild tricuspid regurgitation.  TTE 04/2020: 1. Left ventricular ejection fraction, by estimation, is 40 to 45%. The  left ventricle has mildly decreased function. The left ventricle  demonstrates global hypokinesis. There is moderate left ventricular  hypertrophy. Left ventricular diastolic  parameters are indeterminate.   2. Right ventricular systolic function is normal. The right ventricular  size is normal. A device wire is visualized.   3. The mitral valve is grossly normal. Trivial mitral valve  regurgitation.   4. The aortic valve is tricuspid. Aortic valve regurgitation is not  visualized.   5. The inferior vena cava is normal in size with greater than 50%  respiratory variability, suggesting right atrial pressure of 3 mmHg.   Recent Labs: 07/31/2022: BUN 25; Creatinine, Ser 1.49; Hemoglobin 11.9; Platelets 229; Potassium 4.4; Sodium 135  Recent Lipid Panel    Component Value Date/Time   CHOL 84 05/29/2020 0526   TRIG 200 (H) 05/29/2020 0526   HDL 27 (L) 05/29/2020 0526   CHOLHDL 3.1 05/29/2020 0526   VLDL 40 05/29/2020 0526   LDLCALC 17 05/29/2020 0526    Home Medications   Current Meds  Medication Sig   amoxicillin-clavulanate (AUGMENTIN) 875-125 MG tablet Take 1 tablet by mouth 2 (two) times daily.   aspirin 325 MG tablet Take 325 mg by mouth daily.   atorvastatin (LIPITOR) 20 MG tablet Take 20 mg by mouth daily.   Cholecalciferol (VITAMIN D3) 5000 units CAPS Take 1 capsule by mouth daily.   furosemide (LASIX) 20 MG tablet Take 2 tablets (40 mg total) by mouth daily.   gabapentin (NEURONTIN) 300 MG capsule Take 300 mg by mouth 3 (three) times daily.   glipiZIDE (GLUCOTROL) 5 MG tablet Take 5 mg by mouth 2 (two) times daily before a meal.   metFORMIN (GLUCOPHAGE) 500 MG tablet Take 500 mg by mouth 2 (two) times daily with a meal.   metoprolol succinate (TOPROL XL) 25 MG 24 hr tablet Take 1 tablet (25 mg total) by mouth 2  (two) times daily. (Patient taking differently: Take 25 mg by mouth daily.)   nitroGLYCERIN (NITROSTAT) 0.4 MG SL tablet Place 1 tablet (0.4 mg total) under the tongue every 5 (five) minutes as needed for chest pain.   sacubitril-valsartan (ENTRESTO) 97-103 MG TAKE 1 TABLET BY MOUTH TWICE A DAY IF TOP NUMBER ON BLOOD PRESSURE UNDER 90, DO NOT TAKE MEDICATION.   spironolactone (ALDACTONE) 25 MG tablet Take 12.5 mg by mouth daily.   TRAZODONE HCL PO Take 100 mg by mouth at bedtime.    vitamin B-12 (CYANOCOBALAMIN) 500 MCG tablet Take 1 tablet (500 mcg total) by mouth daily.     Review of Systems    All other systems reviewed and are otherwise negative except as noted above.  Physical Exam    VS:  BP 110/68 (BP Location: Left Arm, Patient Position: Sitting, Cuff Size: Normal)   Pulse 87  Ht 5\' 10"  (1.778 m)   Wt 240 lb 8 oz (109.1 kg)   SpO2 100%   BMI 34.51 kg/m  , BMI Body mass index is 34.51 kg/m.  Wt Readings from Last 3 Encounters:  09/23/22 240 lb 8 oz (109.1 kg)  07/31/22 240 lb (108.9 kg)  03/31/22 240 lb (108.9 kg)     GEN: Obese, 67 y.o. male, in no acute distress. HEENT: normal. Neck: Supple, no JVD, carotid bruits, or masses. Cardiac: S1/S2, RRR, no murmurs, rubs, or gallops. No clubbing, cyanosis, edema.  Radials/PT 2+ and equal bilaterally.  Respiratory:  Respirations regular and unlabored, clear to auscultation bilaterally. MS: No deformity or atrophy. Skin: Warm and dry, no rash. Neuro:  Strength and sensation are intact. Psych: Normal affect.  Assessment & Plan    HFrecEF, NICM [facility] revealed EF returned to normal, grade 1 DD. Euvolemic and well compensated on exam. Weight is stable. Continue Entresto, Aldactone, Toprol XL, and Lasix. Low sodium diet, fluid restriction <2L, and daily weights encouraged. Educated to contact our office for weight gain of 2 lbs overnight or 5 lbs in one week. Heart healthy diet and regular cardiovascular  exercise encouraged.   Palpitations, Wenckebach, ICD in place Recent sensation of "butterfly in chest." [facility] [place] still pending. Will request results when available. Recent NST negative for ischemia. Past hx and documented hx of Wenckebach, discussed with pt. Currently asymptomatic. Last remote device check 11/2021. Will refer to EP for evaluation. ED precautions discussed.   HLD Managed by [place]. Labs closely followed in [place]. At next visit, will request most recent labs. Continue atorvastatin. Heart healthy diet and regular cardiovascular exercise encouraged.   Disposition: Follow up in 3-4 month(s) with Carlyle Dolly, MD or APP.  Signed, Finis Bud, NP 09/27/2022, 3:49 PM [place]

## 2022-09-29 ENCOUNTER — Telehealth: Payer: Self-pay | Admitting: Nurse Practitioner

## 2022-09-29 NOTE — Telephone Encounter (Signed)
Informed home health they probably got the OV note due to them being listed as his pcp.  We are aware they are not a pcp office.    Call placed to patient - states he sees provider at [facility].

## 2022-09-29 NOTE — Telephone Encounter (Signed)
[facility] home [facility] calling, they received the OV note for the pt but they need more information like what the pt needs, insurance info etc.

## 2022-10-08 ENCOUNTER — Telehealth: Payer: Self-pay

## 2022-10-08 NOTE — Telephone Encounter (Signed)
Received call from nurse at [facility].  She asked for patient's EP physician contact information.  Provided [place] office phone number.   Pt transferred from [facility] Device clinic to [facility] device clinic for remote follow up 12/2021.

## 2022-10-14 ENCOUNTER — Telehealth: Payer: Self-pay | Admitting: Cardiovascular Disease

## 2022-10-14 NOTE — Telephone Encounter (Signed)
Archie Patten, a Cardiac RN with [facility] states they faxed the holter monitor results to [phone]. Archie Patten would like to ensure the fax was received, and to make sure pt doesn't need a sooner appt for pacemaker upgrade. She states that everyone in the office is gone at this moment, but the someone can call [phone] at ext [phone] and leave a message and it will be received in the morning.

## 2022-10-15 NOTE — Telephone Encounter (Signed)
Left message for Kenney Houseman at [facility] to call the office.

## 2022-12-12 ENCOUNTER — Encounter: Payer: Self-pay | Admitting: *Deleted

## 2022-12-12 ENCOUNTER — Ambulatory Visit
Payer: No Typology Code available for payment source | Attending: Cardiovascular Disease | Admitting: Cardiovascular Disease

## 2022-12-12 ENCOUNTER — Encounter: Payer: Self-pay | Admitting: Cardiovascular Disease

## 2022-12-12 VITALS — BP 124/60 | HR 84 | Ht 71.0 in | Wt 241.0 lb

## 2022-12-12 DIAGNOSIS — I428 Other cardiomyopathies: Secondary | ICD-10-CM

## 2022-12-12 DIAGNOSIS — I441 Atrioventricular block, second degree: Secondary | ICD-10-CM | POA: Insufficient documentation

## 2022-12-12 LAB — CUP PACEART INCLINIC DEVICE CHECK
Battery Remaining Longevity: 74 mo
Brady Statistic RV Percent Paced: 5.7 %
Date Time Interrogation Session: 20240614112816
HighPow Impedance: 82.125
Implantable Lead Connection Status: 753985
Implantable Lead Implant Date: 20190730
Implantable Lead Location: 753860
Implantable Pulse Generator Implant Date: 20190730
Lead Channel Impedance Value: 362.5 Ohm
Lead Channel Pacing Threshold Amplitude: 1 V
Lead Channel Pacing Threshold Amplitude: 1 V
Lead Channel Pacing Threshold Pulse Width: 0.5 ms
Lead Channel Pacing Threshold Pulse Width: 0.5 ms
Lead Channel Sensing Intrinsic Amplitude: 11.5 mV
Lead Channel Setting Pacing Amplitude: 2.5 V
Lead Channel Setting Pacing Pulse Width: 0.5 ms
Lead Channel Setting Sensing Sensitivity: 0.5 mV
Pulse Gen Serial Number: 9839597
Zone Setting Status: 755011

## 2022-12-12 NOTE — Progress Notes (Signed)
PCP: Wallace Cullens, MD Primary Cardiologist: Dr Wyline Mood Primary EP: Dr Maxine Glenn III is a 67 y.o. male who presents today for routine electrophysiology followup.  Since last being seen in our clinic, the patient reports doing very well.    Since last seen in our clinic, he was sent to the ER with an episode of chest pressure in February.  Subsequent evaluation led to a stress test and Holter monitor placement.  We have the Holter monitor report scanned under the media tab, but it is not accompanied by the strips.  The patient states that he was noticed to have heart block during his stress test.  His Holter monitor reports that there is occasional V pacing with A-V dissociation.  He notes occasional palpitations --an occasional "thought" in his chest.  He does not have presyncope, paroxysms of fatigue or lightheadedness.   Today, he denies symptoms of palpitations, chest pain, shortness of breath,  lower extremity edema, dizziness, presyncope, syncope, or ICD shocks.  The patient is otherwise without complaint today.   Past Medical History:  Diagnosis Date   Back pain, chronic    CHF (congestive heart failure) (HCC)    Diabetes mellitus    Nonischemic cardiomyopathy (HCC)    a. EF 40% by echo in 2003 b. 03/2016: cath showing normal cors and EF of 25-35%, EF 40-45% by echo with diffuse hypokinesis.   Stroke [facility])        Current Outpatient Medications  Medication Sig Dispense Refill   aspirin 325 MG tablet Take 325 mg by mouth daily.     atorvastatin (LIPITOR) 20 MG tablet Take 20 mg by mouth daily.     Cholecalciferol (VITAMIN D3) 5000 units CAPS Take 1 capsule by mouth daily.     furosemide (LASIX) 20 MG tablet Take 2 tablets (40 mg total) by mouth daily. 270 tablet 0   gabapentin (NEURONTIN) 300 MG capsule Take 300 mg by mouth 3 (three) times daily.     glipiZIDE (GLUCOTROL) 5 MG tablet Take 5 mg by mouth 2 (two) times daily before a meal.     metFORMIN  (GLUCOPHAGE) 500 MG tablet Take 500 mg by mouth 2 (two) times daily with a meal.     metoprolol succinate (TOPROL XL) 25 MG 24 hr tablet Take 1 tablet (25 mg total) by mouth 2 (two) times daily. 45 tablet 0   nitroGLYCERIN (NITROSTAT) 0.4 MG SL tablet Place 1 tablet (0.4 mg total) under the tongue every 5 (five) minutes as needed for chest pain. 25 tablet 3   sacubitril-valsartan (ENTRESTO) 49-51 MG Take 1 tablet by mouth 2 (two) times daily. 60 tablet 11   spironolactone (ALDACTONE) 25 MG tablet Take 12.5 mg by mouth daily.     TRAZODONE HCL PO Take 100 mg by mouth at bedtime.      vitamin B-12 (CYANOCOBALAMIN) 500 MCG tablet Take 1 tablet (500 mcg total) by mouth daily. 30 tablet 1   No current facility-administered medications for this visit.    Physical Exam: Vitals:   06/14/21 1137  BP: 116/62  Pulse: 74  SpO2: 95%  Weight: 240 lb 9.6 oz (109.1 kg)  Height: 5\' 11"  (1.803 m)    Gen: Appears comfortable, well-nourished CV: RRR, no dependent edema The device site is normal -- no tenderness, edema, drainage, redness, threatened erosion. Pulm: breathing easily   ICD interrogation- reviewed in detail today,  See PACEART report  [facility] Holter monitor report reviewed from February 2024 Sinus  rhythm, heart range from 4251 bpm, average 87 There was nonsustained VT 6.9% PVCs occurring predominantly as single complexes, rare bigeminal complexes No symptoms were reported  Wt Readings from Last 3 Encounters:  06/14/21 240 lb 9.6 oz (109.1 kg)  04/04/21 240 lb (108.9 kg)  02/27/21 241 lb 9.6 oz (109.6 kg)    Assessment and Plan:  1.  Chronic systolic dysfunction/ nonischemic CM euvolemic today Stable on an appropriate medical regimen Normal ICD function See Pace Art report No changes today he is not device dependant today followed in ICM device clinic  ?Heart block Per patient recollection from stress test Will request record of strips from the [place] He denies having symptoms He  is rarely V-paced; and from the holter report, it is unclear whether his pacing is occurring during daytime or during sleep Will request records from the [place] including strips.  I do not see an urgent indication for atrial lead placement today.    HTN Stable No change required today    Return to see me in [place] in a year  Maurice Small, MD 06/14/2021 11:52 AM

## 2022-12-12 NOTE — Patient Instructions (Signed)

## 2023-01-13 ENCOUNTER — Ambulatory Visit: Payer: No Typology Code available for payment source | Admitting: Nurse Practitioner

## 2023-12-04 ENCOUNTER — Encounter: Payer: No Typology Code available for payment source | Admitting: Cardiovascular Disease
